# Patient Record
Sex: Female | Born: 1970
Health system: Southern US, Community
[De-identification: ages and names within clinical notes are randomized; demographics above are authoritative.]

## PROBLEM LIST (undated history)

## (undated) DIAGNOSIS — E669 Obesity, unspecified: Secondary | ICD-10-CM

## (undated) DIAGNOSIS — M2550 Pain in unspecified joint: Secondary | ICD-10-CM

## (undated) DIAGNOSIS — M1990 Unspecified osteoarthritis, unspecified site: Secondary | ICD-10-CM

## (undated) DIAGNOSIS — J069 Acute upper respiratory infection, unspecified: Secondary | ICD-10-CM

## (undated) DIAGNOSIS — T7840XA Allergy, unspecified, initial encounter: Secondary | ICD-10-CM

## (undated) DIAGNOSIS — R002 Palpitations: Secondary | ICD-10-CM

## (undated) DIAGNOSIS — E039 Hypothyroidism, unspecified: Secondary | ICD-10-CM

## (undated) DIAGNOSIS — F419 Anxiety disorder, unspecified: Secondary | ICD-10-CM

## (undated) DIAGNOSIS — F329 Major depressive disorder, single episode, unspecified: Secondary | ICD-10-CM

## (undated) DIAGNOSIS — K219 Gastro-esophageal reflux disease without esophagitis: Secondary | ICD-10-CM

## (undated) DIAGNOSIS — L920 Granuloma annulare: Secondary | ICD-10-CM

## (undated) DIAGNOSIS — Z87440 Personal history of urinary (tract) infections: Secondary | ICD-10-CM

## (undated) DIAGNOSIS — N979 Female infertility, unspecified: Secondary | ICD-10-CM

## (undated) DIAGNOSIS — G43909 Migraine, unspecified, not intractable, without status migrainosus: Secondary | ICD-10-CM

## (undated) DIAGNOSIS — Z9889 Other specified postprocedural states: Secondary | ICD-10-CM

## (undated) DIAGNOSIS — Z0000 Encounter for general adult medical examination without abnormal findings: Secondary | ICD-10-CM

## (undated) DIAGNOSIS — R109 Unspecified abdominal pain: Secondary | ICD-10-CM

## (undated) DIAGNOSIS — E785 Hyperlipidemia, unspecified: Secondary | ICD-10-CM

## (undated) DIAGNOSIS — M79674 Pain in right toe(s): Secondary | ICD-10-CM

## (undated) DIAGNOSIS — R112 Nausea with vomiting, unspecified: Secondary | ICD-10-CM

## (undated) HISTORY — DX: Major depressive disorder, single episode, unspecified: F32.9

## (undated) HISTORY — PX: BUNIONECTOMY: SHX129

## (undated) HISTORY — DX: Acute upper respiratory infection, unspecified: J06.9

## (undated) HISTORY — PX: TUBAL LIGATION: SHX77

## (undated) HISTORY — DX: Pain in unspecified joint: M25.50

## (undated) HISTORY — DX: Migraine, unspecified, not intractable, without status migrainosus: G43.909

## (undated) HISTORY — DX: Encounter for general adult medical examination without abnormal findings: Z00.00

## (undated) HISTORY — DX: Other specified postprocedural states: R11.2

## (undated) HISTORY — DX: Palpitations: R00.2

## (undated) HISTORY — DX: Granuloma annulare: L92.0

## (undated) HISTORY — DX: Personal history of urinary (tract) infections: Z87.440

## (undated) HISTORY — DX: Gastro-esophageal reflux disease without esophagitis: K21.9

## (undated) HISTORY — DX: Unspecified abdominal pain: R10.9

## (undated) HISTORY — DX: Hyperlipidemia, unspecified: E78.5

## (undated) HISTORY — DX: Female infertility, unspecified: N97.9

## (undated) HISTORY — DX: Allergy, unspecified, initial encounter: T78.40XA

## (undated) HISTORY — DX: Hypothyroidism, unspecified: E03.9

## (undated) HISTORY — DX: Obesity, unspecified: E66.9

## (undated) HISTORY — DX: Anxiety disorder, unspecified: F41.9

## (undated) HISTORY — PX: CHOLECYSTECTOMY: SHX55

## (undated) HISTORY — DX: Pain in right toe(s): M79.674

## (undated) HISTORY — DX: Other specified postprocedural states: Z98.890

## (undated) HISTORY — PX: URETHRAL STRICTURE DILATATION: SHX477

## (undated) HISTORY — DX: Unspecified osteoarthritis, unspecified site: M19.90

## (undated) HISTORY — PX: TONSILLECTOMY: SHX5217

---

## 1999-06-18 ENCOUNTER — Other Ambulatory Visit: Admission: RE | Admit: 1999-06-18 | Discharge: 1999-06-18 | Payer: Self-pay | Admitting: Obstetrics and Gynecology

## 1999-12-09 HISTORY — PX: ECTOPIC PREGNANCY SURGERY: SHX613

## 1999-12-24 ENCOUNTER — Observation Stay (HOSPITAL_COMMUNITY): Admission: AD | Admit: 1999-12-24 | Discharge: 1999-12-25 | Payer: Self-pay | Admitting: Obstetrics and Gynecology

## 1999-12-24 ENCOUNTER — Encounter: Payer: Self-pay | Admitting: Obstetrics and Gynecology

## 1999-12-24 ENCOUNTER — Encounter (INDEPENDENT_AMBULATORY_CARE_PROVIDER_SITE_OTHER): Payer: Self-pay

## 2000-02-06 ENCOUNTER — Other Ambulatory Visit: Admission: RE | Admit: 2000-02-06 | Discharge: 2000-02-06 | Payer: Self-pay | Admitting: Obstetrics and Gynecology

## 2000-03-09 ENCOUNTER — Ambulatory Visit (HOSPITAL_COMMUNITY): Admission: RE | Admit: 2000-03-09 | Discharge: 2000-03-09 | Payer: Self-pay | Admitting: Obstetrics and Gynecology

## 2000-03-09 ENCOUNTER — Encounter: Payer: Self-pay | Admitting: Obstetrics and Gynecology

## 2001-03-04 ENCOUNTER — Other Ambulatory Visit: Admission: RE | Admit: 2001-03-04 | Discharge: 2001-03-04 | Payer: Self-pay | Admitting: Obstetrics and Gynecology

## 2001-11-29 ENCOUNTER — Encounter: Admission: RE | Admit: 2001-11-29 | Discharge: 2001-11-29 | Payer: Self-pay | Admitting: Internal Medicine

## 2001-11-29 ENCOUNTER — Encounter: Payer: Self-pay | Admitting: Internal Medicine

## 2002-02-11 ENCOUNTER — Other Ambulatory Visit: Admission: RE | Admit: 2002-02-11 | Discharge: 2002-02-11 | Payer: Self-pay | Admitting: Obstetrics and Gynecology

## 2002-03-23 ENCOUNTER — Encounter: Admission: RE | Admit: 2002-03-23 | Discharge: 2002-03-23 | Payer: Self-pay | Admitting: Internal Medicine

## 2002-03-23 ENCOUNTER — Encounter: Payer: Self-pay | Admitting: Internal Medicine

## 2002-05-03 ENCOUNTER — Ambulatory Visit (HOSPITAL_COMMUNITY): Admission: RE | Admit: 2002-05-03 | Discharge: 2002-05-04 | Payer: Self-pay | Admitting: General Surgery

## 2002-05-03 ENCOUNTER — Encounter (INDEPENDENT_AMBULATORY_CARE_PROVIDER_SITE_OTHER): Payer: Self-pay | Admitting: *Deleted

## 2003-03-02 ENCOUNTER — Other Ambulatory Visit: Admission: RE | Admit: 2003-03-02 | Discharge: 2003-03-02 | Payer: Self-pay | Admitting: Obstetrics and Gynecology

## 2003-09-09 ENCOUNTER — Inpatient Hospital Stay (HOSPITAL_COMMUNITY): Admission: AD | Admit: 2003-09-09 | Discharge: 2003-09-09 | Payer: Self-pay | Admitting: Obstetrics and Gynecology

## 2003-09-11 ENCOUNTER — Inpatient Hospital Stay (HOSPITAL_COMMUNITY): Admission: AD | Admit: 2003-09-11 | Discharge: 2003-09-12 | Payer: Self-pay | Admitting: Obstetrics and Gynecology

## 2003-09-11 ENCOUNTER — Encounter: Payer: Self-pay | Admitting: Obstetrics and Gynecology

## 2003-09-11 ENCOUNTER — Inpatient Hospital Stay (HOSPITAL_COMMUNITY): Admission: AD | Admit: 2003-09-11 | Discharge: 2003-09-11 | Payer: Self-pay | Admitting: Obstetrics and Gynecology

## 2004-03-25 ENCOUNTER — Other Ambulatory Visit: Admission: RE | Admit: 2004-03-25 | Discharge: 2004-03-25 | Payer: Self-pay | Admitting: Obstetrics and Gynecology

## 2004-06-24 ENCOUNTER — Ambulatory Visit (HOSPITAL_COMMUNITY): Admission: RE | Admit: 2004-06-24 | Discharge: 2004-06-24 | Payer: Self-pay | Admitting: Gastroenterology

## 2005-04-21 ENCOUNTER — Other Ambulatory Visit: Admission: RE | Admit: 2005-04-21 | Discharge: 2005-04-21 | Payer: Self-pay | Admitting: Obstetrics and Gynecology

## 2006-02-03 ENCOUNTER — Inpatient Hospital Stay (HOSPITAL_COMMUNITY): Admission: AD | Admit: 2006-02-03 | Discharge: 2006-02-03 | Payer: Self-pay | Admitting: Obstetrics and Gynecology

## 2006-02-06 ENCOUNTER — Inpatient Hospital Stay (HOSPITAL_COMMUNITY): Admission: AD | Admit: 2006-02-06 | Discharge: 2006-02-06 | Payer: Self-pay | Admitting: Obstetrics and Gynecology

## 2006-02-09 ENCOUNTER — Inpatient Hospital Stay (HOSPITAL_COMMUNITY): Admission: AD | Admit: 2006-02-09 | Discharge: 2006-02-09 | Payer: Self-pay | Admitting: Obstetrics and Gynecology

## 2006-02-12 ENCOUNTER — Inpatient Hospital Stay (HOSPITAL_COMMUNITY): Admission: AD | Admit: 2006-02-12 | Discharge: 2006-02-12 | Payer: Self-pay | Admitting: Obstetrics and Gynecology

## 2006-10-25 ENCOUNTER — Inpatient Hospital Stay (HOSPITAL_COMMUNITY): Admission: AD | Admit: 2006-10-25 | Discharge: 2006-10-25 | Payer: Self-pay | Admitting: Obstetrics and Gynecology

## 2006-10-30 ENCOUNTER — Inpatient Hospital Stay (HOSPITAL_COMMUNITY): Admission: AD | Admit: 2006-10-30 | Discharge: 2006-10-30 | Payer: Self-pay | Admitting: Obstetrics and Gynecology

## 2007-03-18 ENCOUNTER — Inpatient Hospital Stay (HOSPITAL_COMMUNITY): Admission: AD | Admit: 2007-03-18 | Discharge: 2007-03-18 | Payer: Self-pay | Admitting: Obstetrics and Gynecology

## 2007-03-30 ENCOUNTER — Ambulatory Visit: Payer: Self-pay | Admitting: Cardiology

## 2007-03-30 LAB — CONVERTED CEMR LAB
CO2: 29 meq/L (ref 19–32)
Chloride: 105 meq/L (ref 96–112)
Creatinine, Ser: 0.6 mg/dL (ref 0.4–1.2)
Glucose, Bld: 91 mg/dL (ref 70–99)
Magnesium: 1.7 mg/dL (ref 1.5–2.5)
Potassium: 3.5 meq/L (ref 3.5–5.1)
Sodium: 138 meq/L (ref 135–145)

## 2007-04-08 ENCOUNTER — Ambulatory Visit: Payer: Self-pay

## 2007-04-08 ENCOUNTER — Encounter: Payer: Self-pay | Admitting: Internal Medicine

## 2007-04-21 ENCOUNTER — Ambulatory Visit: Payer: Self-pay | Admitting: Cardiology

## 2007-04-21 ENCOUNTER — Ambulatory Visit: Payer: Self-pay | Admitting: Gynecology

## 2007-04-28 ENCOUNTER — Ambulatory Visit: Payer: Self-pay | Admitting: Obstetrics & Gynecology

## 2007-05-05 ENCOUNTER — Ambulatory Visit: Payer: Self-pay | Admitting: Gynecology

## 2007-05-13 ENCOUNTER — Ambulatory Visit: Payer: Self-pay | Admitting: Obstetrics and Gynecology

## 2007-05-16 ENCOUNTER — Inpatient Hospital Stay (HOSPITAL_COMMUNITY): Admission: AD | Admit: 2007-05-16 | Discharge: 2007-05-20 | Payer: Self-pay | Admitting: Obstetrics & Gynecology

## 2007-05-16 ENCOUNTER — Encounter (INDEPENDENT_AMBULATORY_CARE_PROVIDER_SITE_OTHER): Payer: Self-pay | Admitting: Obstetrics & Gynecology

## 2008-08-03 ENCOUNTER — Ambulatory Visit: Payer: Self-pay | Admitting: Gastroenterology

## 2008-08-03 DIAGNOSIS — K625 Hemorrhage of anus and rectum: Secondary | ICD-10-CM | POA: Insufficient documentation

## 2008-08-11 ENCOUNTER — Telehealth: Payer: Self-pay | Admitting: Gastroenterology

## 2008-08-16 ENCOUNTER — Ambulatory Visit: Payer: Self-pay | Admitting: Gastroenterology

## 2011-01-27 ENCOUNTER — Other Ambulatory Visit: Payer: Self-pay | Admitting: Obstetrics and Gynecology

## 2011-01-27 DIAGNOSIS — R928 Other abnormal and inconclusive findings on diagnostic imaging of breast: Secondary | ICD-10-CM

## 2011-01-28 ENCOUNTER — Ambulatory Visit
Admission: RE | Admit: 2011-01-28 | Discharge: 2011-01-28 | Disposition: A | Discharge status: Home / Self Care | Payer: BC Managed Care – PPO | Source: Ambulatory Visit | Attending: Obstetrics and Gynecology | Admitting: Obstetrics and Gynecology

## 2011-01-28 DIAGNOSIS — R928 Other abnormal and inconclusive findings on diagnostic imaging of breast: Secondary | ICD-10-CM

## 2011-04-22 NOTE — Op Note (Signed)
NAMEAIMI, [name]          ACCOUNT NO.:  [id number]   MEDICAL RECORD NO.:  [id number]          PATIENT TYPE:  INP   LOCATION:                                FACILITY:  [facility]   PHYSICIAN:  Freddy Finner, M.D.   DATE OF BIRTH:  06-12-1971   DATE OF PROCEDURE:  05/16/2007  DATE OF DISCHARGE:                               OPERATIVE REPORT   PREOPERATIVE DIAGNOSES:  1. Intrauterine pregnancy with twins.  2. Premature rupture of membranes at 34 1/2 weeks.  3. Onset of premature labor.  4. Twins by ultrasound were vertex, transverse with back down on the      ultrasound.   SECONDARY DIAGNOSIS:  Request for voluntary sterilization.   POSTOPERATIVE DIAGNOSES:  1. Intrauterine pregnancy  with twins.  2. Premature rupture of membranes at 34 1/2 weeks.  3. Onset of premature labor.  4. Twins by ultrasound were vertex, transverse with back down on the      ultrasound.  5. Delivery of viable twins. Twin A was female, Apgar's of 8 and 9. Cord      pH of 7.36. Twin B was a girl, Apgar's of 8 and 9. Cord arterial pH      of 7.35.   OPERATIVE PROCEDURE:  Primary low transverse cervical Cesarean section  with delivery of twin infants.   SECONDARY PROCEDURE:  Bilateral tubal ligation. Small segment of the  __________ portion of the tube from the right was excised. No fimbria  was present. The pedicle was suture ligated with O Monocryl. The left  fallopian tube was suture ligated with O Monocryl and a segment of tube  excised between two interrupted ties. The distal mucosa, distal to the  __________ , was fulgurated with a __________ salpinx. Uterine fibroids  were noted. They were subserosal and intramural.   INTRAOPERATIVE COMPLICATIONS:  None.   ESTIMATED INTRAOPERATIVE BLOOD LOSS:  800 cc.   ANESTHESIA:  Spinal.   DESCRIPTION OF PROCEDURE:  The patient was brought to the operating room  and placed under adequate spinal anesthesia, placed in the dorsal  recumbent position with  elevation of the right hip by 15 degrees.  Abdomen was prepped and draped with Betadine scrub, followed by Betadine  solution. Foley catheter was placed using sterile technique. A lower  abdominal transverse incision was made and carried sharply down the  fascia. Bleeding subcutaneous vessels were controlled with a bovie.  Fascia was entered sharply and extended to the skin incision in a  transverse direction. Rectus sheath was developed superiorly and  inferiorly with blunt and sharp dissection. Rectus muscle was divided at  the midline. Peritoneum was elevated and entered sharply and extended  broadly to the extent of skin incision. Bladder blade was placed.  Transverse incision was made in the vesicoperitoneumoverlying the lower  uterine segment. Transverse incision was made in the lower segment and  extended bluntly in a transverse direction. Amniotic fluid was still  present. It was ruptured and was clear. A viable female was first  delivered without difficulty. Arterial cord blood as well as routine  cord bloods were obtained from both cords.  The second twin had feet  palpable through the membrane. They were grasped and membrane was  ruptured. The breech extraction was accomplished with no trauma or  difficulty. Both placenta's were then removed. The uterus was delivered  onto the abdominal wall. Careful manual expiration of the cavity  revealed complete evacuation of the placenta's. The uterine incision was  closed in abdominal layer with running, locking O Monocryl for the first  layer and imitating suture of O Monocryl for the second. Bladder flap  was reapproximated with an interrupted O Monocryl suture. Segments of  the tubes were excised as noted above. The uterus was pushed back into  the abdominal cavity, irrigation was carried out. Hemostasis was  confirmed at both tubal ligation sites and the lower transverse  incision. Pack, needle, and instruments counts were correct. The   abdominal incision was closed in layers. Running O Monocryl was used to  close the peritoneum and re-approximate the rectus muscles. Fascia was  closed with double looped O PDS, running from angle to angle on either  side. Subcutaneous tissue was approximated with running 2-0 plain. Skin  was closed with long skin staples and 1/4 inch Steri-strips.   DISPOSITION:  The patient tolerated the operative procedure well and was  taken to the recovery room in good condition.      Freddy Finner, M.D.  Electronically Signed     WRN/MEDQ  D:  05/16/2007  T:  05/16/2007  Job:  [id number]

## 2011-04-22 NOTE — Assessment & Plan Note (Signed)
[facility]                            CARDIOLOGY OFFICE NOTE   [name]                 MRN:          [id number]  DATE:04/21/2007                            DOB:          03-19-1971    [name] returns today for further management of her palpitations.   She has had no events since we called her and asked her to supplement  her potassium. Her potassium was 3.5. She is doing this through dietary  measures.   Her 2-D echocardiogram was completely normal.   Her blood work including a magnesium and thyroid function was normal.   Her pressure today is 112/72. Her pulse is 96 and regular. Her weight is  196.  Respiratory exam is unchanged.  She has some trivial peripheral edema.   ASSESSMENT AND PLAN:  [name] is doing well. We will ask to return  her CardioNet monitor in and continue with dietary potassium  supplementation. We will see her back on a p.r.n. basis.     Thomas C. Daleen Squibb, MD, [facility]  Electronically Signed    TCW/MedQ  DD: 04/21/2007  DT: 04/21/2007  Job #: [id number]   cc:   Guy Sandifer. Henderson Cloud, M.D.

## 2011-04-25 NOTE — H&P (Signed)
[facility]  Patient:    [name]                    MRN: [id number] Adm. Date:  [id number] Attending:  Trevor Iha                         History and Physical  HISTORY OF PRESENT ILLNESS:   [name] is a 40 year old, gravida 1, para 0, who presented with nausea, vomiting, abdominal pain, and cramping.  She underwent an ultrasound which showed a 7 week 1 day right ectopic pregnancy with fetal heart tones.  Also blood in the cul-de-sac.  The patients hemoglobin was 13, although, was having right shoulder pain and some rebound symptoms in the abdomen consistent with ruptured ectopic.  Blood type is O positive.  The patient denies any previous history of any kind of GYN problems, history of endometriosis, or tubal surgery.  PAST MEDICAL HISTORY:         Negative.  PAST SURGICAL HISTORY:        Significant for bunion surgery and knee surgery.  PAST GYN HISTORY:             Significant for dysplasia, but no history of endometriosis or tubal surgery.  MEDICATIONS:                  Prenatal vitamins.  ALLERGIES:                    No known drug allergies.  PHYSICAL EXAMINATION:  VITAL SIGNS:                  Blood pressure 112/60, pulse 98, respirations 20.  She does become orthostatic upon standing.  HEART:                        Regular rate and rhythm.  LUNGS:                        Clear to auscultation bilaterally.  ABDOMEN:                      Nondistended.  Mild tenderness to deep palpation,  generalized and some mild rebound symptoms.  PELVIC:                       Reveals an anteverted mobile uterus, approximately 6 weeks size with tenderness in the right adnexa and fullness.  Ultrasound shows  probable right ectopic pregnancy measuring 7 weeks 1 day with fetal heart tones  present.  IMPRESSION:                   Probable right ectopic pregnancy.  Probable hemoperitoneum.  PLAN:                          Laparoscopic evaluation and treatment of right ectopic pregnancy.  The risks and benefits were discussed at length with the patient including, but not limited to risk of infection, bleeding, damage to uterus, tubes, ovaries, bowel, or bladder, need for laparotomy, also need for blood products including risks of blood products including risks of infection such as hepatitis or AIDS and reaction to  blood transfusion.  The patient does desire future fertility, so we weill do everything we can to preserve her fertility, however,  she does understand that he is at high risk for a right salpingectomy with possible oophorectomy.  She does  give her informed consent. DD:  12/24/99 TD:  12/24/99 Job: [id number]

## 2011-04-25 NOTE — Op Note (Signed)
[facility]  Patient:    [name]                    MRN: [id number] Proc. Date: 12/24/99 Adm. Date:  [id number] Attending:  Trevor Iha                           Operative Report  PREOPERATIVE DIAGNOSIS:       Probable right ectopic pregnancy and probable hemoperitoneum.  POSTOPERATIVE DIAGNOSIS:      Right ectopic pregnancy, hemoperitoneum, and pelvic adhesive disease.  OPERATION:                    Operative laparoscopy with right salpingectomy and lysis of adhesions.  SURGEON:                      Trevor Iha, M.D.  ASSISTANT:  ANESTHESIA:                   General endotracheal anesthesia.  ESTIMATED BLOOD LOSS:         600 cc.  INDICATIONS:                  [name] is a 41 year old, gravida 1, para 0, who presented with abdominal pain, positive pregnancy test, and orthostatic blood pressures despite a hemoglobin of 13, blood type O positive.  Ultrasound did show a right ectopic pregnancy at 7 weeks 1 day with fetal heart tones.  As the right ectopic pregnancy and probable hemoperitoneum, I recommended laparoscopic evaluation and treatment.  The risks and benefits were discussed at length including, but not limited to, risk of infection, bleeding, damage to uterus, ovaries, bowel, or bladder, need for laparotomy, right salpingectomy, or right salpingo-oophorectomy.  The patient also understands the risks of blood transfusion and gives her informed consent.  FINDINGS:                     There is a right ampullary ectopic pregnancy. Extensive adhesions from the right tube and ovary.  Also adhesions from the appendix to the pelvic side wall and left ovary to the left fallopian tube. Hemoperitoneum.  Normal appearing liver and uterus.  DESCRIPTION OF PROCEDURE:     After adequate general anesthesia, the patient was placed in the dorsal lithotomy position.  She was sterilely prepped and draped.  The  bladder was sterilely drained.  A Cohen tenaculum was placed on the cervix. A 1 cm infraumbilical skin incision was made.  The Veress needle was inserted. The abdomen was insufflated to dullness to percussion.  The above findings were noted after a trocar was inserted.  Two 5 mm ports were placed to the left and right f the midline two fingerbreadths above the pubic symphysis.  The majority of the hemoperitoneum was evacuated with the Nezhat suction irrigation.  The right fallopian tube and ovary were densely adhesed.  Lysis of adhesions was performed. The distal ampullary portion of the tube had active bleeding.  This was cauterized with Bovie cautery.  Ectopic pregnancy appeared to be in the ampullary portion,  part of the tubal abortion was occurring.  Graspers were used to evacuate some f the tissue from the distal end of the tube.  However, being unsuccessful at this, a linear salpingotomy was performed on the antimesenteric border of the ampullary  portion of the tube.  The majority of the ectopic  pregnancy was removed, however, because of the tubal obstruction from both adhesive disease and from the ectopic pregnancy, unable to achieve adequate hemostasis and preserve the tube.  The tube was freed from the right ovary and the pelvic side wall after fairly extensive dissection.  The tube and scar tissue encased the right ovary.  After lysis of adhesions to free up the right ovary from the tube, the midportion was grasped,  bipolar cautery was used to cross the midportion of the tube down to the mesosalpinx assuring hemostasis and sharp endoshears were used to excise the distal portion of the tube.  Good hemostasis was achieved.  The ovary again was freed rom the scar tissue both in the cul-de-sac and also from the fallopian tube. Hemostasis was achieved using bipolar cautery.  After a copious amount of irrigation, adequate hemostasis was assured on the right side.   Reexamination of the abdomen showed the appendix adhered to the pelvic side wall.  This was sharply dissected and hemostasis was assured taking care to avoid the actual appendix. No evidence of endometriosis was noted at this time.  The left fallopian tube was adhered to the pelvic side wall and also to the ovary.  Sharp dissection was performed around the fimbriated end of the tube, freeing the fimbriated end from the pelvic side wall and the ovary.  Hemostasis was achieved with bipolar cautery. After the lysis of adhesions, the left fallopian tube appeared to be free and the fimbriated end opened and no more adhesions from the ovary or tube. Reexamination of the right tube and ovary showed approximately 3 cm of right fallopian tube left, but good hemostasis along the mesosalpinx and the fallopian tube after the slapingectomy.  Good hemostasis along the ovary and no more adhesions noted. The appendix also appeared to be in good shape.  Copious amount of irrigation after  adequate evacuation of the hemoperitoneum of approximately 600 cc of blood. The laparoscope was removed.  The umbilical incision was closed with a 0 Vicryl on he fascia and 3-0 Vicryl Rapide in the subcuticular fashion.  The 5 mm port incisions were closed with 3-0 Vicryl Rapide in the subcuticular fashion with good approximation and good hemostasis.  They were injected with 0.25% Marcaine. The tenaculum was removed from the cervix.  The cervix was noted to be hemostatic. The patient tolerated the procedure well and was stable on transfer to the recovery  room.  Estimated blood loss during the procedure again was 600 cc.  The patient  will remain overnight in observation status for evaluation and will be discharged to home in the morning. DD:  12/24/99 TD:  12/24/99 Job: [id number]

## 2011-04-25 NOTE — Discharge Summary (Signed)
NAME:  [name], [name]                    ACCOUNT NO.:  [id number]   MEDICAL RECORD NO.:  [id number]                   PATIENT TYPE:  INP   LOCATION:  9303                                 FACILITY:  [facility]   PHYSICIAN:  Guy Sandifer. Arleta Creek, M.D.           DATE OF BIRTH:  Apr 13, 1971   DATE OF ADMISSION:  09/12/2003  DATE OF DISCHARGE:  09/12/2003                                 DISCHARGE SUMMARY   ADMISSION DIAGNOSES:  1. Falling quantitative hCG.  2. Possible ectopic pregnancy.  3. Possible spontaneous abortion.   DISCHARGE DIAGNOSES:  1. Abnormal pregnancy.  2. Possible ectopic pregnancy.  3. Possible spontaneous abortion.   REASON FOR ADMISSION:  The patient is a 40 year old married white female,  G2, P0, status post right partial salpingectomy for ectopic pregnancy in the  past.  She has been followed for serial quantitative hCGs as follows:  On  September 09, 2003, 400, on September 11, 2003, 1362, later in the day on September 11, 2003, 986, and on September 12, 2003, 1509.  She presents to the hospital  with pelvic pain.  She is admitted for observation.   HOSPITAL COURSE:  The patient is admitted to the hospital for careful  observation.  Upon admission hemoglobin is 13.5, and white count is 6.4.  Ultrasound is consistent with some fluid in the cul-de-sac, but no evidence  of IUP or distinct adnexal mass.  Vital signs are stable and she remains  afebrile.  Later in the day, she complains of some dull aching in the left  lower quadrant, although it is not severe.  There is no dizziness, no  syncope, no nausea or vomiting, and no vaginal bleeding.  Abdomen is flat  and soft with positive bowel sounds and mild left lower quadrant tenderness  without rebound or masses.  The day prior an ultrasound in the office there  was a 1.3 cm cystic mass in the left adnexa.  Options and management were  carefully discussed with the patient.  The fact that there is an abnormal  change in the  quantitative hCG is discussed.  I also discussed this with Dr.  Luisa Hart of pathology regarding her lab results as well.  After reviewing the  options with the patient and her husband she is given methotrexate per  protocol.  She is then discharged home later in the day.   CONDITION ON DISCHARGE:  Good.   DIET:  Regular as tolerated.   ACTIVITY:  No vaginal entry, no heavy lifting.  She is to call the office  for problems, including, but not limited to heavy vaginal bleeding,  increasing pain, persistent nausea/vomiting, dizziness, or syncope.   FOLLOWUP:  Per the methotrexate protocol, as well as further followup in the  office.  Guy Sandifer Arleta Creek, M.D.   JET/MEDQ  D:  10/05/2003  T:  10/05/2003  Job:  [id number]

## 2011-04-25 NOTE — Discharge Summary (Signed)
[name], [name]          ACCOUNT NO.:  [id number]   MEDICAL RECORD NO.:  [id number]          PATIENT TYPE:  INP   LOCATION:                                FACILITY:  [facility]   PHYSICIAN:  Freddy Finner, M.D.   DATE OF BIRTH:  [birth year]   DATE OF ADMISSION:  05/16/2007  DATE OF DISCHARGE:  05/20/2007                               DISCHARGE SUMMARY   ADMITTING DIAGNOSES:  1. Intrauterine pregnancy 34-1/2 weeks' estimated gestational age.  2. Pre-term premature rupture of membranes.  3. Twin gestation.   DISCHARGE DIAGNOSES:  1. Status post low transverse cesarean section.  2. Viable female and female infant.  3. Permanent sterilization.   PROCEDURE:  1. Primary low transverse cesarean section.  2. Bilateral tubal ligation.   REASON FOR ADMISSION:  Please see written H&P.   HOSPITAL COURSE:  The patient is a 40 year old white married female  gravida 4, para 0, that presents to [facility] at 34-1/2  weeks with spontaneous rupture of membranes.  Fluid was noted to be  clear.  Cervix was examined and noted to be 2 to 3 cm dilated with  cervix 90% effaced.  Ultrasound was performed which revealed twin A in  vertex presentation, twin B in transverse presentation, with a decision  was made to proceed with a cesarean delivery.  The patient had also  requested permanent sterilization.  The patient was then transferred to  the operating room where spinal anesthesia was administered without  difficulty.  A low transverse incision was made with delivery of twin A  female infant with Apgars of 8 at 1 minute, 9 at 5 minute.  Arterial cord  pH of 7.36, twin B female infant with Apgars of 8 at 1 minute, 9 at 5  minutes.  Arterial cord pH was 7.35.  The tubal ligation was performed,  and the patient was later transferred to the recovery room in stable  condition.  On postoperative day #1, the patient was without complaint.  Vital signs were stable.  Abdomen soft, fundus firm  and nontender.   LABORATORY FINDINGS:  Revealed hemoglobin of 9.1, platelet count of  230,000, WBC count of 14.6.   On postoperative day #2, the patient did complain of some itching, and  then a micropapulous rash, was noted on her chest.  Vital signs were  otherwise stable.  She is afebrile.  Abdomen soft.  Fundus firm and  nontender.  Incision was clean, dry and intact.  Hydrocortisone was  ordered for the rash, and pain medication was changed to Dilaudid.  On  postoperative day #3, the patient was without complaint.  Vital signs  were stable.  Fundus firm and nontender.  Incision was clean, dry and  intact.  On postoperative day,  #4 the patient was without complaint.  Vital signs were stable.  She is  afebrile.  Fundus firm and nontender.  Incision was clean, dry and  intact.  Staples were removed and the patient was later discharged home.   CONDITION ON DISCHARGE:  Stable.   DIET:  Regular as tolerated.   ACTIVITY:  No heavy  lifting, no driving x2 weeks, no vaginal entry.   FOLLOWUP:  The patient to follow up in the office in 1 to 2 weeks for an  incision check.  She is to call for a temperature of greater than 100  degrees, persistent nausea with vomiting, any vaginal bleeding and/or  redness or drainage from incisional site.   DISCHARGE MEDICATIONS:  1. Dilaudid 2 mg, one p.o. every 4 to 6 hours.  2. Motrin 600 mg every 6 hours.  3. Prenatal vitamins one p.o. daily.      [name], N.P.      Freddy Finner, M.D.  Electronically Signed    CC/MEDQ  D:  06/18/2007  T:  06/19/2007  Job:  [id number]

## 2011-04-25 NOTE — Assessment & Plan Note (Signed)
[facility]                            CARDIOLOGY OFFICE NOTE   [name], [name]                 MRN:          [id number]  DATE:03/30/2007                            DOB:          02/08/1971    I was asked by Dr. Harold Hedge to evaluate [name], a  delightful 40 year old married white female for rapid heartbeat and  dizziness.   She is pregnant with twins at 81 weeks.  This is her first pregnancy.   Starting in March she had sporadic episodes of a rapid heartbeat that  would only last a few minutes.  This has become very frequent lately,  happening as many times as two times a day over the last week and a  half.  It comes on suddenly, unpredictably, and stops just as quickly.  She has associated dizziness and then she gets a headache.   She does have a history of migraines in the past.   PAST MEDICAL HISTORY:  She was told she had a benign flow murmur by Dr.  Demetrius Revel years ago.  She has had a history of tachy palpitations in the  past but nothing like this.   She has had no history of thyroid disease or any sort of endocrine  problem.   She is only on prenatal vitamins one daily and Pepcid AC for reflux.   ALLERGIES:  She is allergic to DYE for kidney studies in the past.   She does not smoke, drink or use illicit drugs.  She does have 2-3 cans  of caffeinated beverages a day.   PAST SURGICAL HISTORY:  1. Tonsillectomy in 1977.  2. Bunionectomy in 1993.  3. Knee surgery in 1997.  4. Cyst removal of the hand in 1999.   FAMILY HISTORY:  Negative for premature coronary disease.   SOCIAL HISTORY:  She is married and as mentioned above, she is pregnant.   REVIEW OF SYSTEMS:  Other than the HPI, is positive only for some  allergies and hay fever and gastroesophageal reflux.   Her review of systems were completely reviewed.   PHYSICAL EXAMINATION:  GENERAL APPEARANCE:  She is very pleasant.  She  is in no acute  distress.  VITAL SIGNS:  Her blood pressure is 100/67 with pulse of 88 lying down,  sitting is 98/66 with pulse of 98 and standing is 102/66 with pulse of  108.  After standing for five minutes, her blood pressure was 97/68 and  a heart rate of 101.  She felt okay during the entire orthostatic  checks.  She is 5 feet 8-1/2 and weighs 183 pounds.  HEENT:  Normocephalic and atraumatic.  PERRLA.  Extraocular movements  intact.  Sclerae are clear.  Facial symmetry is normal.  NECK:  Carotid upstrokes are equal bilaterally without bruits.  There is  no JVD.  Thyroid is not enlarged.  Trachea is midline.  LUNGS:  Clear.  CARDIOVASCULAR:  Nondisplaced PMI.  She has normal S1 and S2 without rub  or gallop.  She does have a soft systolic murmur along the left sternal  border.  ABDOMEN:  Soft but  distended with pregnancy.  There was good bowel  sounds.  EXTREMITIES:  No edema.  Pulses are brisk.  NEUROLOGIC:  Intact.  SKIN:  Normal.   EKG shows sinus tachycardia, rate of 106 with normal PR, QRS and QTC is  upper limits of normal.   ASSESSMENT/PLAN:  1. Tachycardia which sounds sudden onset and stops suddenly as well.      Rule out supraventricular tachycardia.  2. Systolic murmur, probably benign flow murmur.   PLAN:  1. A 2-D echocardiogram  without structural heart disease.  2. BMET and magnesium.  3. TSH and free T4.  4. CardioNet monitor.   Follow up with me in 2-3 weeks to review and hopefully diagnose his  problem.   I have told her to stay well hydrated, if she feels presyncopal or  syncopal, to lie down and lay on her left side, and to come to the  emergency room if the tachycardia is persistent.     Thomas C. Daleen Squibb, MD, [facility]  Electronically Signed    TCW/MedQ  DD: 03/30/2007  DT: 03/30/2007  Job #: [id number]   cc:   Georgann Housekeeper, MD

## 2011-04-25 NOTE — Op Note (Signed)
[place]  Patient:    [name], [name] Visit Number: [id number] MRN: [id number]          Service Type: DSU Location: [id number] Attending Physician:  Delsa Bern Dictated by:   Lorne Skeens. Hoxworth, M.D. Proc. Date: 05/03/02 Admit Date:  05/03/2002 Discharge Date: 05/04/2002                             Operative Report  PREOPERATIVE DIAGNOSIS:  Cholelithiasis and cholecystitis.  POSTOPERATIVE DIAGNOSIS:  Cholelithiasis and cholecystitis.  OPERATION PERFORMED:  Laparoscopic cholecystectomy.  SURGEON:  Lorne Skeens. Hoxworth, M.D.  ASSISTANT:  Donnie Coffin. Samuella Cota, M.D.  ANESTHESIA:  General.  INDICATIONS FOR PROCEDURE:  The patient is a 40 year old white female who presents with typical episodes of biliary colic and gallbladder ultrasound showing stones.  Common bile duct is normal and LFTs are normal.  Laparoscopic cholecystectomy has been recommended and accepted.  The nature of the procedure, its indications and risks of bleeding, infection, bile leak and bile duct injury were discussed and understood.  The patient is now brought to the operating room for this procedure.  DESCRIPTION OF PROCEDURE:  The patient was brought to the operating room and placed in supine position on the operating table and general endotracheal anesthesia was induced.  The abdomen was sterilely prepped and draped.  She received preoperative antibiotics.  PAS were in place.  Local anesthesia was used to infiltrate the trocar sites.  A 1 cm incision was made at the umbilicus and dissection carried down to the midline fascia which was sharply incised for 1 cm and the peritoneum entered under direct vision.  Through a mattress suture of 0 Vicryl, the Hasson trocar was inserted and pneumoperitoneum established.  Under direct vision a 10 mm trocar was placed in the subxiphoid area and two 5 mm trocars along the right subcostal margin near the  gallbladder was visualized and had significant omental adhesions to the fundus that were taken down sharply and infundibulum exposed and retracted inferolaterally.  The peritoneum anterior and posterior along Calots triangle was divided with cautery and fibrofatty tissue was stripped off the neck of the gallbladder toward the porta hepatis.  The distal gallbladder in Calots triangle was thoroughly dissected and cystic duct identified, cystic artery identified and cystic duct, gallbladder junction dissected 360 degrees.  When the anatomy was clear, the cystic duct was doubly clipped proximally and divided as was the cystic.  The gallbladder was then dissected free from its bed using hook cautery and removed intact through the umbilicus.  Operative site was inspected for hemostasis and all trocars removed under direct vision and CO2 evacuated from the peritoneal cavity.  The mattress suture was secured to the umbilicus.  The skin incisions were closed with interrupted subcuticular 4-0 Monocryl and Steri-Strips.  Sponge, needle and instrument counts were correct.  Dry sterile dressing was applied.  The patient was taken to the recovery room in good condition. Dictated by:   Lorne Skeens. Hoxworth, M.D. Attending Physician:  Delsa Bern DD:  05/03/02 TD:  05/04/02 Job: [id number]

## 2011-06-26 ENCOUNTER — Other Ambulatory Visit: Payer: Self-pay | Admitting: Dermatology

## 2011-08-26 ENCOUNTER — Ambulatory Visit (INDEPENDENT_AMBULATORY_CARE_PROVIDER_SITE_OTHER): Payer: BC Managed Care – PPO | Admitting: Family Medicine

## 2011-08-26 ENCOUNTER — Encounter: Payer: Self-pay | Admitting: Family Medicine

## 2011-08-26 VITALS — BP 105/58 | HR 66 | Temp 98.1°F | Ht 68.5 in | Wt 164.4 lb

## 2011-08-26 DIAGNOSIS — L920 Granuloma annulare: Secondary | ICD-10-CM | POA: Insufficient documentation

## 2011-08-26 DIAGNOSIS — F411 Generalized anxiety disorder: Secondary | ICD-10-CM

## 2011-08-26 DIAGNOSIS — Z0000 Encounter for general adult medical examination without abnormal findings: Secondary | ICD-10-CM | POA: Insufficient documentation

## 2011-08-26 DIAGNOSIS — R002 Palpitations: Secondary | ICD-10-CM | POA: Insufficient documentation

## 2011-08-26 DIAGNOSIS — Z87440 Personal history of urinary (tract) infections: Secondary | ICD-10-CM | POA: Insufficient documentation

## 2011-08-26 DIAGNOSIS — F419 Anxiety disorder, unspecified: Secondary | ICD-10-CM | POA: Insufficient documentation

## 2011-08-26 DIAGNOSIS — Z23 Encounter for immunization: Secondary | ICD-10-CM

## 2011-08-26 HISTORY — DX: Encounter for general adult medical examination without abnormal findings: Z00.00

## 2011-08-26 MED ORDER — ALPRAZOLAM 0.25 MG PO TABS: ORAL_TABLET | ORAL | Status: DC

## 2011-08-26 NOTE — Assessment & Plan Note (Signed)
Patient has tried Zoloft in the past but it caused night sweats so she stopped it. For now she is given some Alprazolam to use prn to see if her symptoms improve if they do not may need to consider daily medication. Also encouraged to start fish oil and increase sleep. She is having a sensation of dyspnea when she lies down a couple times a week and she has palpitations during the day at times.

## 2011-08-26 NOTE — Progress Notes (Signed)
[name] [id number] 1971-08-14 08/26/2011      Progress Note New Patient  Subjective  Chief Complaint  Chief Complaint  Patient presents with  . Establish Care    new patient    HPI  Patient is a 40 year old female in today for new patient appointment. She is the mother of 48-year-old twins and has been struggling with some anxiety intermittently since they were born. Has tried Zoloft in the past but that gave her night sweats and made her feel bad so she stopped it. At present she is having increased palpitations both day and night. She has episodes of feeling as if she's suffocating at bedtime roughly twice a week. When she sits up if symptoms abate after her she coughs or so for several minutes. She denies that the dyspnea ever awakens her. The dyspnea and the palpitations do not occur together. No nausea, diaphoresis is noted with these episodes. She receives her GYN care from [facility]. No history of abnormal Paps. Has had some cysts noted in her right breast. 6 she's recently had a skin biopsy with Dr. 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 Improved at this time. No recent illness, fevers, chills, chest pain, shortness of breath except for the dyspnea at bedtime as noted, GI or GU complaints  Past Medical History  Diagnosis Date  . Granuloma annulare     bx confirmed, improved  . Anxiety   . Hx: UTI (urinary tract infection)     in childhood, resolved with urethral stretching  . Palpitations   . Preventative health care 08/26/2011    Past Surgical History  Procedure Date  . Cesarean section   . Cholecystectomy   . Tubal ligation   . Bunionectomy     left  . Tonsillectomy   . Urethral stricture dilatation     childhood    Family History  Problem Relation Age of Onset  . Hypertension Mother   . Depression Father     suicide  . Hypertension Father   . COPD Father   . Hypertension Brother   . Alzheimer's  disease Maternal Grandmother   . Alzheimer's disease Maternal Grandfather   . Heart disease Paternal Grandfather     MI    History   Social History  . Marital Status: Married    Spouse Name: N/A    Number of Children: N/A  . Years of Education: N/A   Occupational History  . Not on file.   Social History Main Topics  . Smoking status: Never Smoker   . Smokeless tobacco: Never Used  . Alcohol Use: No  . Drug Use: No  . Sexually Active: Yes   Other Topics Concern  . Not on file   Social History Narrative  . No narrative on file    No current outpatient prescriptions on file prior to visit.    Not on File  Review of Systems  Review of Systems  Constitutional: Negative for fever, chills and malaise/fatigue.  HENT: Negative for hearing loss, nosebleeds and congestion.   Eyes: Negative for discharge.  Respiratory: Negative for cough, sputum production, shortness of breath and wheezing.   Cardiovascular: Negative for chest pain, palpitations and leg swelling.  Gastrointestinal: Negative for heartburn, nausea, vomiting, abdominal pain, diarrhea, constipation and blood in stool.  Genitourinary: Negative for dysuria, urgency, frequency and hematuria.  Musculoskeletal: Negative for myalgias, back pain and falls.  Skin: Negative for rash.  Neurological: Negative for dizziness, tremors,  sensory change, focal weakness, loss of consciousness, weakness and headaches.  Endo/Heme/Allergies: Negative for polydipsia. Does not bruise/bleed easily.  Psychiatric/Behavioral: Negative for depression and suicidal ideas. The patient is nervous/anxious and has insomnia.     Objective  BP 105/58  Pulse 66  Temp(Src) 98.1 F (36.7 C) (Oral)  Ht 5' 8.5" (1.74 m)  Wt 164 lb 6.4 oz (74.571 kg)  BMI 24.63 kg/m2  SpO2 98%  Physical Exam  Physical Exam  Constitutional: She is oriented to person, place, and time and well-developed, well-nourished, and in no distress. No distress.  HENT:   Head: Normocephalic and atraumatic.  Right Ear: External ear normal.  Left Ear: External ear normal.  Nose: Nose normal.  Mouth/Throat: Oropharynx is clear and moist. No oropharyngeal exudate.  Eyes: Conjunctivae are normal. Pupils are equal, round, and reactive to light. Right eye exhibits no discharge. Left eye exhibits no discharge. No scleral icterus.  Neck: Normal range of motion. Neck supple. No thyromegaly present.  Cardiovascular: Normal rate, regular rhythm, normal heart sounds and intact distal pulses.   No murmur heard. Pulmonary/Chest: Effort normal and breath sounds normal. No respiratory distress. She has no wheezes. She has no rales.  Abdominal: Soft. Bowel sounds are normal. She exhibits no distension and no mass. There is no tenderness.  Musculoskeletal: Normal range of motion. She exhibits no edema and no tenderness.  Lymphadenopathy:    She has no cervical adenopathy.  Neurological: She is alert and oriented to person, place, and time. She has normal reflexes. No cranial nerve deficit. Coordination normal.  Skin: Skin is warm and dry. No rash noted. She is not diaphoretic.  Psychiatric: Mood, memory and affect normal.       Assessment & Plan  Hx: UTI (urinary tract infection) No difficulty since childhood.  Palpitations Check thyroid and try Alprazolam prn if no response would benefit from Holter monitor if symptoms persist.  Anxiety Patient has tried Zoloft in the past but it caused night sweats so she stopped it. For now she is given some Alprazolam to use prn to see if her symptoms improve if they do not may need to consider daily medication. Also encouraged to start fish oil and increase sleep. She is having a sensation of dyspnea when she lies down a couple times a week and she has palpitations during the day at times.   Preventative health care Given Tdap and flu shot today. Encouraged 8 hours of sleep, regular exercise, small, frequent meals with lean  proteins and complex carbs

## 2011-08-26 NOTE — Assessment & Plan Note (Signed)
Given Tdap and flu shot today. Encouraged 8 hours of sleep, regular exercise, small, frequent meals with lean proteins and complex carbs

## 2011-08-26 NOTE — Assessment & Plan Note (Signed)
Check thyroid and try Alprazolam prn if no response would benefit from Holter monitor if symptoms persist.

## 2011-08-26 NOTE — Assessment & Plan Note (Signed)
No difficulty since childhood.

## 2011-08-27 LAB — CBC WITH DIFFERENTIAL/PLATELET
Basophils Relative: 0.5 % (ref 0.0–3.0)
Eosinophils Absolute: 0.1 10*3/uL (ref 0.0–0.7)
Eosinophils Relative: 2.2 % (ref 0.0–5.0)
HCT: 40.2 % (ref 36.0–46.0)
Hemoglobin: 13.4 g/dL (ref 12.0–15.0)
MCHC: 33.4 g/dL (ref 30.0–36.0)
MCV: 89.5 fl (ref 78.0–100.0)
Monocytes Absolute: 0.5 10*3/uL (ref 0.1–1.0)
Neutro Abs: 2.7 10*3/uL (ref 1.4–7.7)
Neutrophils Relative %: 56 % (ref 43.0–77.0)
RBC: 4.49 Mil/uL (ref 3.87–5.11)
WBC: 4.9 10*3/uL (ref 4.5–10.5)

## 2011-08-27 LAB — RENAL FUNCTION PANEL
Albumin: 4.5 g/dL (ref 3.5–5.2)
BUN: 14 mg/dL (ref 6–23)
CO2: 28 mEq/L (ref 19–32)
Calcium: 8.7 mg/dL (ref 8.4–10.5)
Chloride: 104 mEq/L (ref 96–112)
Creatinine, Ser: 1 mg/dL (ref 0.4–1.2)
GFR: 68.69 mL/min (ref >60.00)
Glucose, Bld: 82 mg/dL (ref 70–99)
Phosphorus: 3 mg/dL (ref 2.3–4.6)
Potassium: 4.5 mEq/L (ref 3.5–5.1)
Sodium: 138 mEq/L (ref 135–145)

## 2011-08-27 LAB — HEPATIC FUNCTION PANEL
ALT: 11 U/L (ref 0–35)
Albumin: 4.5 g/dL (ref 3.5–5.2)
Bilirubin, Direct: 0.1 mg/dL (ref 0.0–0.3)
Total Protein: 7.5 g/dL (ref 6.0–8.3)

## 2011-08-27 LAB — LIPID PANEL
Total CHOL/HDL Ratio: 3
Triglycerides: 53 mg/dL (ref 0.0–149.0)

## 2011-08-27 LAB — T4, FREE: Free T4: 0.98 ng/dL (ref 0.60–1.60)

## 2011-08-27 LAB — TSH: TSH: 1.9 u[IU]/mL (ref 0.35–5.50)

## 2011-08-27 LAB — MAGNESIUM: Magnesium: 2.1 mg/dL (ref 1.5–2.5)

## 2011-08-27 NOTE — Progress Notes (Signed)
Addended by: Baldemar Lenis R on: 08/27/2011 12:49 PM   Modules accepted: Orders

## 2011-08-28 ENCOUNTER — Telehealth: Payer: Self-pay

## 2011-08-28 NOTE — Telephone Encounter (Signed)
Patient informed. 

## 2011-08-28 NOTE — Telephone Encounter (Signed)
Informed patient of lab results and patient stated md mentioned something about picking up a heart monitor? Patient was wandering what she should do? Please advise?

## 2011-08-28 NOTE — Telephone Encounter (Signed)
Remind her I told her to try the medication prn this week for anxiety and then see if it gets better, if no better then we can order a monitor next week or two

## 2011-08-28 NOTE — Telephone Encounter (Signed)
Left a message for patient to return my call. 

## 2011-09-12 ENCOUNTER — Ambulatory Visit (INDEPENDENT_AMBULATORY_CARE_PROVIDER_SITE_OTHER): Payer: BC Managed Care – PPO | Admitting: Cardiovascular Disease

## 2011-09-12 ENCOUNTER — Encounter: Payer: Self-pay | Admitting: Cardiovascular Disease

## 2011-09-12 VITALS — BP 101/67 | HR 70 | Resp 18 | Ht 68.0 in | Wt 163.8 lb

## 2011-09-12 DIAGNOSIS — R002 Palpitations: Secondary | ICD-10-CM

## 2011-09-12 NOTE — Assessment & Plan Note (Addendum)
Most likely premature beats. Will have her wear a 48 hour Holter monitor. Will arrange echocardiogram to assess LV function and exclude any structural problems. She is advised to avoid caffeine, stimulants.

## 2011-09-12 NOTE — Patient Instructions (Signed)
Your physician recommends that you schedule a follow-up appointment in: 2-3 weeks.   Your physician has requested that you have an echocardiogram. Echocardiography is a painless test that uses sound waves to create images of your heart. It provides your doctor with information about the size and shape of your heart and how well your heart's chambers and valves are working. This procedure takes approximately one hour. There are no restrictions for this procedure.   Your physician has recommended that you wear a holter monitor. Holter monitors are medical devices that record the heart's electrical activity. Doctors most often use these monitors to diagnose arrhythmias. Arrhythmias are problems with the speed or rhythm of the heartbeat. The monitor is a small, portable device. You can wear one while you do your normal daily activities. This is usually used to diagnose what is causing palpitations/syncope (passing out).

## 2011-09-12 NOTE — Progress Notes (Signed)
History of Present Illness:40 yo WF with history of anxiety but no DM, HLD or HTN who is here today for evaluation of palpitations. She tells me that over the last 6-8 weeks, she has noticed her heart skipping beats. She has had some dizziness. She drinks caffeine occasionally. She does not use energy drinks. The palpitations have been occuring daily but have lessened in frequency lately. She had been seen by Juanito Doom in our office in April 2008 for same complaints while she was pregnant. Echo normal at that time.   Past Medical History  Diagnosis Date  . Granuloma annulare     bx confirmed, improved  . Anxiety   . Hx: UTI (urinary tract infection)     in childhood, resolved with urethral stretching  . Palpitations   . Preventative health care 08/26/2011    Past Surgical History  Procedure Date  . Cesarean section   . Cholecystectomy   . Tubal ligation   . Bunionectomy     left  . Tonsillectomy   . Urethral stricture dilatation     childhood    Current Outpatient Prescriptions  Medication Sig Dispense Refill  . ALPRAZolam (XANAX) 0.25 MG tablet 1/2 to 1 tab po bid prn anxiety/insomnia/palp  30 tablet  0  . CLOBETASOL PROPIONATE E 0.05 % emollient cream as needed.         Allergies  Allergen Reactions  . Contrast Media (Iodinated Diagnostic Agents)     History   Social History  . Marital Status: Married    Spouse Name: N/A    Number of Children: N/A  . Years of Education: N/A   Occupational History  . Not on file.   Social History Main Topics  . Smoking status: Never Smoker   . Smokeless tobacco: Never Used  . Alcohol Use: No  . Drug Use: No  . Sexually Active: Yes   Other Topics Concern  . Not on file   Social History Narrative  . No narrative on file    Family History  Problem Relation Age of Onset  . Hypertension Mother   . Depression Father     suicide  . Hypertension Father   . COPD Father   . Hypertension Brother   . Alzheimer's disease  Maternal Grandmother   . Alzheimer's disease Maternal Grandfather   . Heart disease Paternal Grandfather     MI    Review of Systems:  As stated in the HPI and otherwise negative.   BP 101/67  Pulse 70  Resp 18  Ht 5\' 8"  (1.727 m)  Wt 163 lb 12.8 oz (74.299 kg)  BMI 24.91 kg/m2  Physical Examination: General: Well developed, well nourished, NAD HEENT: OP clear, mucus membranes moist SKIN: warm, dry. No rashes. Neuro: No focal deficits Musculoskeletal: Muscle strength 5/5 all ext Psychiatric: Mood and affect normal Neck: No JVD, no carotid bruits, no thyromegaly, no lymphadenopathy. Lungs:Clear bilaterally, no wheezes, rhonci, crackles Cardiovascular: Regular rate and rhythm. No murmurs, gallops or rubs. Abdomen:Soft. Bowel sounds present. Non-tender.  Extremities: No lower extremity edema. Pulses are 2 + in the bilateral DP/PT.  EKG:NSR, rate 67 bpm.

## 2011-09-18 ENCOUNTER — Ambulatory Visit (HOSPITAL_COMMUNITY): Payer: BC Managed Care – PPO | Attending: Cardiovascular Disease

## 2011-09-18 ENCOUNTER — Encounter: Payer: Self-pay | Admitting: *Deleted

## 2011-09-18 ENCOUNTER — Encounter (INDEPENDENT_AMBULATORY_CARE_PROVIDER_SITE_OTHER): Payer: BC Managed Care – PPO

## 2011-09-18 DIAGNOSIS — F411 Generalized anxiety disorder: Secondary | ICD-10-CM | POA: Insufficient documentation

## 2011-09-18 DIAGNOSIS — R002 Palpitations: Secondary | ICD-10-CM

## 2011-09-25 LAB — CBC
HCT: 26.5 — ABNORMAL LOW
Hemoglobin: 10.9 — ABNORMAL LOW
Hemoglobin: 9.1 — ABNORMAL LOW
Platelets: 230
RBC: 3.52 — ABNORMAL LOW
WBC: 14.6 — ABNORMAL HIGH
WBC: 8.5

## 2011-09-25 LAB — RAPID HIV SCREEN (WH-MAU): Rapid HIV Screen: NONREACTIVE

## 2011-10-02 ENCOUNTER — Other Ambulatory Visit: Payer: Self-pay | Admitting: Obstetrics and Gynecology

## 2011-10-02 DIAGNOSIS — N644 Mastodynia: Secondary | ICD-10-CM

## 2011-10-02 DIAGNOSIS — N6459 Other signs and symptoms in breast: Secondary | ICD-10-CM

## 2011-10-13 ENCOUNTER — Ambulatory Visit: Payer: BC Managed Care – PPO | Admitting: Cardiovascular Disease

## 2011-10-14 ENCOUNTER — Ambulatory Visit
Admission: RE | Admit: 2011-10-14 | Discharge: 2011-10-14 | Disposition: A | Discharge status: Home / Self Care | Payer: BC Managed Care – PPO | Source: Ambulatory Visit | Attending: Obstetrics and Gynecology | Admitting: Obstetrics and Gynecology

## 2011-10-14 ENCOUNTER — Other Ambulatory Visit: Payer: Self-pay | Admitting: Obstetrics and Gynecology

## 2011-10-14 DIAGNOSIS — N6459 Other signs and symptoms in breast: Secondary | ICD-10-CM

## 2011-10-14 DIAGNOSIS — N644 Mastodynia: Secondary | ICD-10-CM

## 2011-10-27 ENCOUNTER — Encounter: Payer: Self-pay | Admitting: Family Medicine

## 2011-10-27 ENCOUNTER — Ambulatory Visit (INDEPENDENT_AMBULATORY_CARE_PROVIDER_SITE_OTHER): Payer: BC Managed Care – PPO | Admitting: Family Medicine

## 2011-10-27 DIAGNOSIS — F32A Depression, unspecified: Secondary | ICD-10-CM

## 2011-10-27 DIAGNOSIS — F411 Generalized anxiety disorder: Secondary | ICD-10-CM

## 2011-10-27 DIAGNOSIS — R002 Palpitations: Secondary | ICD-10-CM

## 2011-10-27 DIAGNOSIS — F329 Major depressive disorder, single episode, unspecified: Secondary | ICD-10-CM

## 2011-10-27 DIAGNOSIS — F419 Anxiety disorder, unspecified: Secondary | ICD-10-CM

## 2011-10-27 DIAGNOSIS — R350 Frequency of micturition: Secondary | ICD-10-CM

## 2011-10-27 HISTORY — DX: Depression, unspecified: F32.A

## 2011-10-27 HISTORY — DX: Anxiety disorder, unspecified: F41.9

## 2011-10-27 MED ORDER — ALPRAZOLAM 0.25 MG PO TABS: ORAL_TABLET | ORAL | Status: DC

## 2011-10-27 MED ORDER — CITALOPRAM HYDROBROMIDE 20 MG PO TABS: 20.0000 mg | ORAL_TABLET | Freq: Every day | ORAL | Status: DC

## 2011-10-27 NOTE — Assessment & Plan Note (Signed)
Patient is noting worsening irritability and crying, difficulty with concentration and sleep persist. The Alprazolam helps her fall asleep and she has not tried it during the day. She reports only taking her Sertraline sporadically in the past but she did not think it helped. We will try Citalopram 10mg  po qd x 7 days and then increase to 20mg  daily, May use Alprazolam qhs and sparingly during the day for an anxiety attack,

## 2011-10-27 NOTE — Assessment & Plan Note (Signed)
Had a recent echo which was unremarkable, these are improved and she is more reassured that these are directly related to her anxiety and depression which is worsening again.

## 2011-10-27 NOTE — Progress Notes (Signed)
[name] [id number] 1971-04-12 10/27/2011      Progress Note-Follow Up  Subjective  Chief Complaint  Chief Complaint  Patient presents with  . Follow-up    2 month follow up    HPI  Patient is a 40 yo female in today for evaluation of worsening anxiety and depression. She was noting some palpitations at her last visit which are much better now. She did have a echo which was unremarkable and she is now more convinced that they are related to increased anxiety. She is noting worsening irritability, crying easily, anhedonia, poor sleep. She has tried Sertraline in the past and that may have helped slightly but she admits to taking it sporadically. She would like to try something else. No CP/sob/fevers/ congestion/GI or GU c/o  Past Medical History  Diagnosis Date  . Granuloma annulare     bx confirmed, improved  . Anxiety   . Hx: UTI (urinary tract infection)     in childhood, resolved with urethral stretching  . Palpitations   . Preventative health care 08/26/2011  . Anxiety and depression 10/27/2011    Past Surgical History  Procedure Date  . Cesarean section   . Cholecystectomy   . Tubal ligation   . Bunionectomy     left  . Tonsillectomy   . Urethral stricture dilatation     childhood    Family History  Problem Relation Age of Onset  . Hypertension Mother   . Depression Father     suicide  . Hypertension Father   . COPD Father   . Hypertension Brother   . Alzheimer's disease Maternal Grandmother   . Alzheimer's disease Maternal Grandfather   . Heart disease Paternal Grandfather     MI    History   Social History  . Marital Status: Married    Spouse Name: N/A    Number of Children: N/A  . Years of Education: N/A   Occupational History  . Not on file.   Social History Main Topics  . Smoking status: Never Smoker   . Smokeless tobacco: Never Used  . Alcohol Use: No  . Drug Use: No  . Sexually Active: Yes   Other Topics Concern  . Not  on file   Social History Narrative  . No narrative on file    Current Outpatient Prescriptions on File Prior to Visit  Medication Sig Dispense Refill  . CLOBETASOL PROPIONATE E 0.05 % emollient cream as needed.         Allergies  Allergen Reactions  . Contrast Media (Iodinated Diagnostic Agents)     Review of Systems  Review of Systems  Constitutional: Negative for fever and malaise/fatigue.  HENT: Negative for congestion.   Eyes: Negative for discharge.  Respiratory: Negative for shortness of breath.   Cardiovascular: Positive for palpitations. Negative for chest pain and leg swelling.       Improved dramatically  Gastrointestinal: Negative for nausea, abdominal pain and diarrhea.  Genitourinary: Negative for dysuria.  Musculoskeletal: Negative for falls.  Skin: Negative for rash.  Neurological: Negative for loss of consciousness and headaches.  Endo/Heme/Allergies: Negative for polydipsia.  Psychiatric/Behavioral: Positive for depression. Negative for suicidal ideas. The patient is nervous/anxious and has insomnia.     Objective  BP 119/73  Pulse 71  Temp(Src) 97.8 F (36.6 C) (Oral)  Ht 5' 8.5" (1.74 m)  Wt 169 lb 12.8 oz (77.021 kg)  BMI 25.44 kg/m2  SpO2 98%  LMP 10/19/2011  Physical Exam  Physical  Exam  Constitutional: She is oriented to person, place, and time and well-developed, well-nourished, and in no distress. No distress.  HENT:  Head: Normocephalic and atraumatic.  Eyes: Conjunctivae are normal.  Neck: Neck supple. No thyromegaly present.  Cardiovascular: Normal rate, regular rhythm and normal heart sounds.   No murmur heard. Pulmonary/Chest: Effort normal and breath sounds normal. She has no wheezes.  Abdominal: She exhibits no distension and no mass.  Musculoskeletal: She exhibits no edema.  Lymphadenopathy:    She has no cervical adenopathy.  Neurological: She is alert and oriented to person, place, and time.  Skin: Skin is warm and dry.  No rash noted. She is not diaphoretic.  Psychiatric: Memory, affect and judgment normal.    Lab Results  Component Value Date   TSH 1.90 08/27/2011   Lab Results  Component Value Date   WBC 4.9 08/27/2011   HGB 13.4 08/27/2011   HCT 40.2 08/27/2011   MCV 89.5 08/27/2011   PLT 259.0 08/27/2011   Lab Results  Component Value Date   CREATININE 1.0 08/27/2011   BUN 14 08/27/2011   NA 138 08/27/2011   K 4.5 08/27/2011   CL 104 08/27/2011   CO2 28 08/27/2011   Lab Results  Component Value Date   ALT 11 08/27/2011   AST 19 08/27/2011   ALKPHOS 69 08/27/2011   BILITOT 0.3 08/27/2011   Lab Results  Component Value Date   CHOL 169 08/27/2011   Lab Results  Component Value Date   HDL 55.10 08/27/2011   Lab Results  Component Value Date   LDLCALC 103* 08/27/2011   Lab Results  Component Value Date   TRIG 53.0 08/27/2011   Lab Results  Component Value Date   CHOLHDL 3 08/27/2011     Assessment & Plan  Palpitations Had a recent echo which was unremarkable, these are improved and she is more reassured that these are directly related to her anxiety and depression which is worsening again.  Anxiety and depression Patient is noting worsening irritability and crying, difficulty with concentration and sleep persist. The Alprazolam helps her fall asleep and she has not tried it during the day. She reports only taking her Sertraline sporadically in the past but she did not think it helped. We will try Citalopram 10mg  po qd x 7 days and then increase to 20mg  daily, May use Alprazolam qhs and sparingly during the day for an anxiety attack,

## 2011-10-27 NOTE — Patient Instructions (Signed)

## 2011-10-29 ENCOUNTER — Ambulatory Visit: Payer: BC Managed Care – PPO | Admitting: Cardiovascular Disease

## 2011-11-13 ENCOUNTER — Encounter: Payer: Self-pay | Admitting: Cardiovascular Disease

## 2011-11-13 ENCOUNTER — Ambulatory Visit (INDEPENDENT_AMBULATORY_CARE_PROVIDER_SITE_OTHER): Payer: BC Managed Care – PPO | Admitting: Cardiovascular Disease

## 2011-11-13 VITALS — BP 110/66 | HR 60 | Ht 68.0 in | Wt 167.4 lb

## 2011-11-13 DIAGNOSIS — I493 Ventricular premature depolarization: Secondary | ICD-10-CM

## 2011-11-13 DIAGNOSIS — I4949 Other premature depolarization: Secondary | ICD-10-CM

## 2011-11-13 NOTE — Patient Instructions (Signed)
Your physician recommends that you schedule a follow-up appointment as needed  

## 2011-11-13 NOTE — Assessment & Plan Note (Signed)
She has PVCs and normal LV function. No further workup. This is benign. I have reassured her. If she becomes more symptomatic, we could consider therapy with a calcium channel blocker or a beta blocker. She does not wish to start therapy at this time. I agree with this. I will see her as needed.

## 2011-11-13 NOTE — Progress Notes (Signed)
History of Present Illness:40 yo WF with history of anxiety but no DM, HLD or HTN who is here today for cardiac follow up. I saw her 09/12/11 for evaluation of palpitations. She told  me that she had noticed her heart skipping beats. She had some dizziness. She drinks caffeine occasionally. She does not use energy drinks. The palpitations were in the setting of stress and anxiety. She had been seen by Juanito Doom in our office in April 2008 for same complaints while she was pregnant. Echo normal at that time. I arranged an echo and a 48 hour Holter monitor. Her echo showed normal LV size and function with LVEF of 55-65%. Her Holter Monitor showed Normal sinus rhythm with frequent PVCs and one 4 beat run of non-sustained VT.   She tells me today that she feels much better. No real awareness of palpitations. NO chest pain or SOB.    Past Medical History  Diagnosis Date  . Granuloma annulare     bx confirmed, improved  . Anxiety   . Hx: UTI (urinary tract infection)     in childhood, resolved with urethral stretching  . Palpitations   . Preventative health care 08/26/2011  . Anxiety and depression 10/27/2011    Past Surgical History  Procedure Date  . Cesarean section   . Cholecystectomy   . Tubal ligation   . Bunionectomy     left  . Tonsillectomy   . Urethral stricture dilatation     childhood    Current Outpatient Prescriptions  Medication Sig Dispense Refill  . ALPRAZolam (XANAX) 0.25 MG tablet 1/2 to 1 tab po bid prn anxiety/insomnia/palp  40 tablet  0  . citalopram (CELEXA) 20 MG tablet Take 1 tablet (20 mg total) by mouth daily.  30 tablet  2  . CLOBETASOL PROPIONATE E 0.05 % emollient cream as needed.         Allergies  Allergen Reactions  . Contrast Media (Iodinated Diagnostic Agents)     History   Social History  . Marital Status: Married    Spouse Name: N/A    Number of Children: N/A  . Years of Education: N/A   Occupational History  . Not on file.   Social  History Main Topics  . Smoking status: Never Smoker   . Smokeless tobacco: Never Used  . Alcohol Use: No  . Drug Use: No  . Sexually Active: Yes   Other Topics Concern  . Not on file   Social History Narrative  . No narrative on file    Family History  Problem Relation Age of Onset  . Hypertension Mother   . Depression Father     suicide  . Hypertension Father   . COPD Father   . Hypertension Brother   . Alzheimer's disease Maternal Grandmother   . Alzheimer's disease Maternal Grandfather   . Heart disease Paternal Grandfather     MI    Review of Systems:  As stated in the HPI and otherwise negative.   BP 110/66  Pulse 60  Ht 5\' 8"  (1.727 m)  Wt 167 lb 6.4 oz (75.932 kg)  BMI 25.45 kg/m2  LMP 10/19/2011  Physical Examination: General: Well developed, well nourished, NAD HEENT: OP clear, mucus membranes moist SKIN: warm, dry. No rashes. Neuro: No focal deficits Musculoskeletal: Muscle strength 5/5 all ext Psychiatric: Mood and affect normal Neck: No JVD, no carotid bruits, no thyromegaly, no lymphadenopathy. Lungs:Clear bilaterally, no wheezes, rhonci, crackles Cardiovascular: Regular rate and  rhythm. No murmurs, gallops or rubs. Abdomen:Soft. Bowel sounds present. Non-tender.  Extremities: No lower extremity edema. Pulses are 2 + in the bilateral DP/PT.  48 Hour Holter Monitor: 09/18/11:  Frequent PVCs. One 4 beat run of non-sustained VT.   Echo: 09/18/11:  Left ventricle: The cavity size was normal. Wall thickness was normal. Systolic function was normal. The estimated ejection fraction was in the range of 55% to 65%. Wall motion was normal; there were no regional wall motion abnormalities. Left ventricular diastolic function parameters were normal. - Pericardium, extracardiac: A trivial pericardial effusion was identified.

## 2011-12-16 ENCOUNTER — Ambulatory Visit: Payer: BC Managed Care – PPO | Admitting: Family Medicine

## 2011-12-24 ENCOUNTER — Ambulatory Visit: Payer: BC Managed Care – PPO | Admitting: Family Medicine

## 2012-02-20 ENCOUNTER — Other Ambulatory Visit: Payer: Self-pay | Admitting: Family Medicine

## 2012-04-06 ENCOUNTER — Ambulatory Visit (INDEPENDENT_AMBULATORY_CARE_PROVIDER_SITE_OTHER): Payer: BC Managed Care – PPO | Admitting: Family Medicine

## 2012-04-06 ENCOUNTER — Encounter: Payer: Self-pay | Admitting: Family Medicine

## 2012-04-06 VITALS — BP 109/70 | HR 75 | Temp 98.7°F | Ht 68.5 in | Wt 173.1 lb

## 2012-04-06 DIAGNOSIS — F411 Generalized anxiety disorder: Secondary | ICD-10-CM

## 2012-04-06 DIAGNOSIS — F32A Depression, unspecified: Secondary | ICD-10-CM

## 2012-04-06 DIAGNOSIS — F329 Major depressive disorder, single episode, unspecified: Secondary | ICD-10-CM

## 2012-04-06 DIAGNOSIS — M79674 Pain in right toe(s): Secondary | ICD-10-CM

## 2012-04-06 DIAGNOSIS — M79609 Pain in unspecified limb: Secondary | ICD-10-CM

## 2012-04-06 DIAGNOSIS — G4700 Insomnia, unspecified: Secondary | ICD-10-CM

## 2012-04-06 DIAGNOSIS — F419 Anxiety disorder, unspecified: Secondary | ICD-10-CM

## 2012-04-06 DIAGNOSIS — F341 Dysthymic disorder: Secondary | ICD-10-CM

## 2012-04-06 DIAGNOSIS — R002 Palpitations: Secondary | ICD-10-CM

## 2012-04-06 MED ORDER — ALPRAZOLAM 0.25 MG PO TABS: ORAL_TABLET | ORAL | Status: DC

## 2012-04-06 MED ORDER — CITALOPRAM HYDROBROMIDE 20 MG PO TABS: 20.0000 mg | ORAL_TABLET | Freq: Every day | ORAL | Status: DC

## 2012-04-06 NOTE — Patient Instructions (Addendum)
Anxiety and Panic Attacks Your caregiver has informed you that you are having an anxiety or panic attack. There may be many forms of this. Most of the time these attacks come suddenly and without warning. They come at any time of day, including periods of sleep, and at any time of life. They may be strong and unexplained. Although panic attacks are very scary, they are physically harmless. Sometimes the cause of your anxiety is not known. Anxiety is a protective mechanism of the body in its fight or flight mechanism. Most of these perceived danger situations are actually nonphysical situations (such as anxiety over losing a job). CAUSES  The causes of an anxiety or panic attack are many. Panic attacks may occur in otherwise healthy people given a certain set of circumstances. There may be a genetic cause for panic attacks. Some medications may also have anxiety as a side effect. SYMPTOMS  Some of the most common feelings are:  Intense terror.   Dizziness, feeling faint.   Hot and cold flashes.   Fear of going crazy.   Feelings that nothing is real.   Sweating.   Shaking.   Chest pain or a fast heartbeat (palpitations).   Smothering, choking sensations.   Feelings of impending doom and that death is near.   Tingling of extremities, this may be from over-breathing.   Altered reality (derealization).   Being detached from yourself (depersonalization).  Several symptoms can be present to make up anxiety or panic attacks. DIAGNOSIS  The evaluation by your caregiver will depend on the type of symptoms you are experiencing. The diagnosis of anxiety or panic attack is made when no physical illness can be determined to be a cause of the symptoms. TREATMENT  Treatment to prevent anxiety and panic attacks may include:  Avoidance of circumstances that cause anxiety.   Reassurance and relaxation.   Regular exercise.   Relaxation therapies, such as yoga.   Psychotherapy with a  psychiatrist or therapist.   Avoidance of caffeine, alcohol and illegal drugs.   Prescribed medication.  SEEK IMMEDIATE MEDICAL CARE IF:   You experience panic attack symptoms that are different than your usual symptoms.   You have any worsening or concerning symptoms.  Document Released: 11/24/2005 Document Revised: 11/13/2011 Document Reviewed: 03/28/2010 [facility] Patient Information 2012 [place], [place].Bunion You have a bunion deformity of the feet. This is more common in women. It tends to be an inherited problem. Symptoms can include pain, swelling, and deformity around the great toe. Numbness and tingling may also be present. Your symptoms are often worsened by wearing shoes that cause pressure on the bunion. Changing the type of shoes you wear helps reduce symptoms. A wide shoe decreases pressure on the bunion. An arch support may be used if you have flat feet. Avoid shoes with heels higher than two inches. This puts more pressure on the bunion. X-rays may be helpful in evaluating the severity of the problem. Other foot problems often seen with bunions include corns, calluses, and hammer toes. If the deformity or pain is severe, surgical treatment may be necessary. Keep off your painful foot as much as possible until the pain is relieved. Call your caregiver if your symptoms are worse.  SEEK IMMEDIATE MEDICAL CARE IF:  You have increased redness, pain, swelling, or other symptoms of infection. Document Released: 11/24/2005 Document Revised: 11/13/2011 Document Reviewed: 05/24/2007 [facility] Patient Information 2012 [place], [place].

## 2012-04-07 ENCOUNTER — Encounter: Payer: Self-pay | Admitting: Family Medicine

## 2012-04-07 DIAGNOSIS — M79674 Pain in right toe(s): Secondary | ICD-10-CM

## 2012-04-07 HISTORY — DX: Pain in right toe(s): M79.674

## 2012-04-07 NOTE — Assessment & Plan Note (Signed)
Since returning from a trip with increased walking she has been struggling with increased right great toe pain with pain radiating into arch. She is encouragd to change her footwear. Try stretching, icing and applying Aspercreme to foot and let us know if no imrpovment

## 2012-04-07 NOTE — Assessment & Plan Note (Signed)
Good response to Citalopram, will continue the same, given refills today

## 2012-04-07 NOTE — Progress Notes (Signed)
Patient ID: [name], female   DOB: 1971-10-08, 41 y.o.   MRN: [id number] ONETA SIGMAN [id number] 02-27-1971 04/07/2012      Progress Note-Follow Up  Subjective  Chief Complaint  Chief Complaint  Patient presents with  . Follow-up    on meds  . pain in big toe    right foot X a couple of months    HPI  Patient is a 41 year old Caucasian female who is in today for followup. She reports S-Citalopram is working well and she feels less anxious and depressed. Less anhedonia. No suicidal ideation. Overall she feels much calmer. She is complaining of right great toe pain radiating into her right arch. It is worse and she went on a trip with increased walking. No injury redness or swelling. She denies any recent palpitations chest pain, sob, gi or gu c/o.  Past Medical History  Diagnosis Date  . Granuloma annulare     bx confirmed, improved  . Anxiety   . Hx: UTI (urinary tract infection)     in childhood, resolved with urethral stretching  . Palpitations   . Preventative health care 08/26/2011  . Anxiety and depression 10/27/2011  . Toe pain, right 04/07/2012    Past Surgical History  Procedure Date  . Cesarean section   . Cholecystectomy   . Tubal ligation   . Bunionectomy     left  . Tonsillectomy   . Urethral stricture dilatation     childhood    Family History  Problem Relation Age of Onset  . Hypertension Mother   . Depression Father     suicide  . Hypertension Father   . COPD Father     smoker  . Alzheimer's disease Maternal Grandmother   . Alzheimer's disease Maternal Grandfather   . Heart disease Paternal Grandfather     MI  . Hypertension Brother     History   Social History  . Marital Status: Married    Spouse Name: N/A    Number of Children: N/A  . Years of Education: N/A   Occupational History  . Not on file.   Social History Main Topics  . Smoking status: Never Smoker   . Smokeless tobacco: Never Used  . Alcohol Use: No  .  Drug Use: No  . Sexually Active: Yes   Other Topics Concern  . Not on file   Social History Narrative  . No narrative on file    Current Outpatient Prescriptions on File Prior to Visit  Medication Sig Dispense Refill  . citalopram (CELEXA) 20 MG tablet Take 1 tablet (20 mg total) by mouth daily.  90 tablet  3  . CLOBETASOL PROPIONATE E 0.05 % emollient cream as needed.         Allergies  Allergen Reactions  . Contrast Media (Iodinated Diagnostic Agents)     Review of Systems  Review of Systems  Constitutional: Negative for fever and malaise/fatigue.  HENT: Negative for congestion.   Eyes: Negative for discharge.  Respiratory: Negative for shortness of breath.   Cardiovascular: Negative for chest pain, palpitations and leg swelling.  Gastrointestinal: Negative for nausea, abdominal pain and diarrhea.  Genitourinary: Negative for dysuria.  Musculoskeletal: Positive for joint pain. Negative for falls.       Right great toe pain  Skin: Negative for rash.  Neurological: Negative for loss of consciousness and headaches.  Endo/Heme/Allergies: Negative for polydipsia.  Psychiatric/Behavioral: Negative for depression and suicidal ideas. The patient is not nervous/anxious and  does not have insomnia.     Objective  BP 109/70  Pulse 75  Temp(Src) 98.7 F (37.1 C) (Temporal)  Ht 5' 8.5" (1.74 m)  Wt 173 lb 1.9 oz (78.527 kg)  BMI 25.94 kg/m2  SpO2 100%  LMP 03/14/2012  Physical Exam  Physical Exam  Constitutional: She is oriented to person, place, and time and well-developed, well-nourished, and in no distress. No distress.  HENT:  Head: Normocephalic and atraumatic.  Eyes: Conjunctivae are normal.  Neck: Neck supple. No thyromegaly present.  Cardiovascular: Normal rate, regular rhythm and normal heart sounds.   No murmur heard. Pulmonary/Chest: Effort normal and breath sounds normal. She has no wheezes.  Abdominal: She exhibits no distension and no mass.    Musculoskeletal: She exhibits no edema.  Lymphadenopathy:    She has no cervical adenopathy.  Neurological: She is alert and oriented to person, place, and time.  Skin: Skin is warm and dry. No rash noted. She is not diaphoretic.  Psychiatric: Memory, affect and judgment normal.    Lab Results  Component Value Date   TSH 1.90 08/27/2011   Lab Results  Component Value Date   WBC 4.9 08/27/2011   HGB 13.4 08/27/2011   HCT 40.2 08/27/2011   MCV 89.5 08/27/2011   PLT 259.0 08/27/2011   Lab Results  Component Value Date   CREATININE 1.0 08/27/2011   BUN 14 08/27/2011   NA 138 08/27/2011   K 4.5 08/27/2011   CL 104 08/27/2011   CO2 28 08/27/2011   Lab Results  Component Value Date   ALT 11 08/27/2011   AST 19 08/27/2011   ALKPHOS 69 08/27/2011   BILITOT 0.3 08/27/2011   Lab Results  Component Value Date   CHOL 169 08/27/2011   Lab Results  Component Value Date   HDL 55.10 08/27/2011   Lab Results  Component Value Date   LDLCALC 103* 08/27/2011   Lab Results  Component Value Date   TRIG 53.0 08/27/2011   Lab Results  Component Value Date   CHOLHDL 3 08/27/2011     Assessment & Plan  Toe pain, right Since returning from a trip with increased walking she has been struggling with increased right great toe pain with pain radiating into arch. She is encouragd to change her footwear. Try stretching, icing and applying Aspercreme to foot and let us know if no imrpovment  Anxiety and depression Good response to Citalopram, will continue the same, given refills today

## 2013-01-03 ENCOUNTER — Ambulatory Visit: Payer: BC Managed Care – PPO

## 2013-01-05 ENCOUNTER — Encounter: Payer: BC Managed Care – PPO | Admitting: Family Medicine

## 2013-01-05 ENCOUNTER — Ambulatory Visit (INDEPENDENT_AMBULATORY_CARE_PROVIDER_SITE_OTHER): Payer: BC Managed Care – PPO

## 2013-01-05 DIAGNOSIS — Z23 Encounter for immunization: Secondary | ICD-10-CM

## 2013-01-07 ENCOUNTER — Encounter: Payer: Self-pay | Admitting: Family Medicine

## 2013-01-07 ENCOUNTER — Ambulatory Visit (INDEPENDENT_AMBULATORY_CARE_PROVIDER_SITE_OTHER): Payer: BC Managed Care – PPO | Admitting: Family Medicine

## 2013-01-07 VITALS — BP 118/74 | HR 74 | Temp 99.2°F | Ht 68.5 in | Wt 184.8 lb

## 2013-01-07 DIAGNOSIS — F32A Depression, unspecified: Secondary | ICD-10-CM

## 2013-01-07 DIAGNOSIS — F411 Generalized anxiety disorder: Secondary | ICD-10-CM

## 2013-01-07 DIAGNOSIS — R300 Dysuria: Secondary | ICD-10-CM

## 2013-01-07 DIAGNOSIS — R319 Hematuria, unspecified: Secondary | ICD-10-CM

## 2013-01-07 DIAGNOSIS — Z87440 Personal history of urinary (tract) infections: Secondary | ICD-10-CM

## 2013-01-07 DIAGNOSIS — G4700 Insomnia, unspecified: Secondary | ICD-10-CM

## 2013-01-07 DIAGNOSIS — R002 Palpitations: Secondary | ICD-10-CM

## 2013-01-07 DIAGNOSIS — J069 Acute upper respiratory infection, unspecified: Secondary | ICD-10-CM

## 2013-01-07 DIAGNOSIS — F419 Anxiety disorder, unspecified: Secondary | ICD-10-CM

## 2013-01-07 DIAGNOSIS — Z0000 Encounter for general adult medical examination without abnormal findings: Secondary | ICD-10-CM

## 2013-01-07 DIAGNOSIS — E785 Hyperlipidemia, unspecified: Secondary | ICD-10-CM

## 2013-01-07 DIAGNOSIS — F3289 Other specified depressive episodes: Secondary | ICD-10-CM

## 2013-01-07 DIAGNOSIS — F341 Dysthymic disorder: Secondary | ICD-10-CM

## 2013-01-07 DIAGNOSIS — F329 Major depressive disorder, single episode, unspecified: Secondary | ICD-10-CM

## 2013-01-07 LAB — CBC
Hemoglobin: 12.5 g/dL (ref 12.0–15.0)
RDW: 12.2 % (ref 11.5–14.6)
WBC: 4.5 10*3/uL (ref 4.5–10.5)

## 2013-01-07 LAB — TB SKIN TEST: Induration: 48 mm

## 2013-01-07 LAB — LIPID PANEL
Cholesterol: 160 mg/dL (ref 0–200)
HDL: 45.1 mg/dL (ref >39.00)
Triglycerides: 92 mg/dL (ref 0.0–149.0)
VLDL: 18.4 mg/dL (ref 0.0–40.0)

## 2013-01-07 LAB — TSH: TSH: 0.45 u[IU]/mL (ref 0.35–5.50)

## 2013-01-07 LAB — HEPATIC FUNCTION PANEL
ALT: 14 U/L (ref 0–35)
Albumin: 4 g/dL (ref 3.5–5.2)
Total Bilirubin: 0.3 mg/dL (ref 0.3–1.2)
Total Protein: 7 g/dL (ref 6.0–8.3)

## 2013-01-07 LAB — POCT URINALYSIS DIPSTICK
Leukocytes, UA: NEGATIVE
Nitrite, UA: NEGATIVE
Protein, UA: NEGATIVE
Urobilinogen, UA: 0.2
pH, UA: 6

## 2013-01-07 LAB — RENAL FUNCTION PANEL
CO2: 30 mEq/L (ref 19–32)
Chloride: 102 mEq/L (ref 96–112)
Creatinine, Ser: 0.9 mg/dL (ref 0.4–1.2)
GFR: 69.08 mL/min (ref >60.00)
Phosphorus: 2.8 mg/dL (ref 2.3–4.6)
Sodium: 136 mEq/L (ref 135–145)

## 2013-01-07 MED ORDER — ALPRAZOLAM 0.25 MG PO TABS: ORAL_TABLET | ORAL | Status: DC

## 2013-01-07 MED ORDER — CIPROFLOXACIN HCL 500 MG PO TABS: 500.0000 mg | ORAL_TABLET | Freq: Two times a day (BID) | ORAL | Status: DC

## 2013-01-07 MED ORDER — CITALOPRAM HYDROBROMIDE 20 MG PO TABS: 20.0000 mg | ORAL_TABLET | Freq: Every day | ORAL | Status: DC

## 2013-01-07 NOTE — Patient Instructions (Addendum)
Probiotic gummy is called [employer] by [employer] krill oil caps 1 daily Preventive Care for Adults, Female A healthy lifestyle and preventive care can promote health and wellness. Preventive health guidelines for women include the following key practices.  A routine yearly physical is a good way to check with your caregiver about your health and preventive screening. It is a chance to share any concerns and updates on your health, and to receive a thorough exam.  Visit your dentist for a routine exam and preventive care every 6 months. Brush your teeth twice a day and floss once a day. Good oral hygiene prevents tooth decay and gum disease.  The frequency of eye exams is based on your age, health, family medical history, use of contact lenses, and other factors. Follow your caregiver's recommendations for frequency of eye exams.  Eat a healthy diet. Foods like vegetables, fruits, whole grains, low-fat dairy products, and lean protein foods contain the nutrients you need without too many calories. Decrease your intake of foods high in solid fats, added sugars, and salt. Eat the right amount of calories for you.Get information about a proper diet from your caregiver, if necessary.  Regular physical exercise is one of the most important things you can do for your health. Most adults should get at least 150 minutes of moderate-intensity exercise (any activity that increases your heart rate and causes you to sweat) each week. In addition, most adults need muscle-strengthening exercises on 2 or more days a week.  Maintain a healthy weight. The body mass index (BMI) is a screening tool to identify possible weight problems. It provides an estimate of body fat based on height and weight. Your caregiver can help determine your BMI, and can help you achieve or maintain a healthy weight.For adults 20 years and older:  A BMI below 18.5 is considered underweight.  A BMI of 18.5 to 24.9 is  normal.  A BMI of 25 to 29.9 is considered overweight.  A BMI of 30 and above is considered obese.  Maintain normal blood lipids and cholesterol levels by exercising and minimizing your intake of saturated fat. Eat a balanced diet with plenty of fruit and vegetables. Blood tests for lipids and cholesterol should begin at age 3 and be repeated every 5 years. If your lipid or cholesterol levels are high, you are over 50, or you are at high risk for heart disease, you may need your cholesterol levels checked more frequently.Ongoing high lipid and cholesterol levels should be treated with medicines if diet and exercise are not effective.  If you smoke, find out from your caregiver how to quit. If you do not use tobacco, do not start.  If you are pregnant, do not drink alcohol. If you are breastfeeding, be very cautious about drinking alcohol. If you are not pregnant and choose to drink alcohol, do not exceed 1 drink per day. One drink is considered to be 12 ounces (355 mL) of beer, 5 ounces (148 mL) of wine, or 1.5 ounces (44 mL) of liquor.  Avoid use of street drugs. Do not share needles with anyone. Ask for help if you need support or instructions about stopping the use of drugs.  High blood pressure causes heart disease and increases the risk of stroke. Your blood pressure should be checked at least every 1 to 2 years. Ongoing high blood pressure should be treated with medicines if weight loss and exercise are not effective.  If you are 55 to 43 years  old, ask your caregiver if you should take aspirin to prevent strokes.  Diabetes screening involves taking a blood sample to check your fasting blood sugar level. This should be done once every 3 years, after age 42, if you are within normal weight and without risk factors for diabetes. Testing should be considered at a younger age or be carried out more frequently if you are overweight and have at least 1 risk factor for diabetes.  Breast cancer  screening is essential preventive care for women. You should practice "breast self-awareness." This means understanding the normal appearance and feel of your breasts and may include breast self-examination. Any changes detected, no matter how small, should be reported to a caregiver. Women in their 18s and 30s should have a clinical breast exam (CBE) by a caregiver as part of a regular health exam every 1 to 3 years. After age 39, women should have a CBE every year. Starting at age 21, women should consider having a mammography (breast X-ray test) every year. Women who have a family history of breast cancer should talk to their caregiver about genetic screening. Women at a high risk of breast cancer should talk to their caregivers about having magnetic resonance imaging (MRI) and a mammography every year.  The Pap test is a screening test for cervical cancer. A Pap test can show cell changes on the cervix that might become cervical cancer if left untreated. A Pap test is a procedure in which cells are obtained and examined from the lower end of the uterus (cervix).  Women should have a Pap test starting at age 68.  Between ages 46 and 67, Pap tests should be repeated every 2 years.  Beginning at age 70, you should have a Pap test every 3 years as long as the past 3 Pap tests have been normal.  Some women have medical problems that increase the chance of getting cervical cancer. Talk to your caregiver about these problems. It is especially important to talk to your caregiver if a new problem develops soon after your last Pap test. In these cases, your caregiver may recommend more frequent screening and Pap tests.  The above recommendations are the same for women who have or have not gotten the vaccine for human papillomavirus (HPV).  If you had a hysterectomy for a problem that was not cancer or a condition that could lead to cancer, then you no longer need Pap tests. Even if you no longer need a Pap  test, a regular exam is a good idea to make sure no other problems are starting.  If you are between ages 49 and 19, and you have had normal Pap tests going back 10 years, you no longer need Pap tests. Even if you no longer need a Pap test, a regular exam is a good idea to make sure no other problems are starting.  If you have had past treatment for cervical cancer or a condition that could lead to cancer, you need Pap tests and screening for cancer for at least 20 years after your treatment.  If Pap tests have been discontinued, risk factors (such as a new sexual partner) need to be reassessed to determine if screening should be resumed.  The HPV test is an additional test that may be used for cervical cancer screening. The HPV test looks for the virus that can cause the cell changes on the cervix. The cells collected during the Pap test can be tested for HPV. The HPV  test could be used to screen women aged 61 years and older, and should be used in women of any age who have unclear Pap test results. After the age of 33, women should have HPV testing at the same frequency as a Pap test.  Colorectal cancer can be detected and often prevented. Most routine colorectal cancer screening begins at the age of 66 and continues through age 73. However, your caregiver may recommend screening at an earlier age if you have risk factors for colon cancer. On a yearly basis, your caregiver may provide home test kits to check for hidden blood in the stool. Use of a small camera at the end of a tube, to directly examine the colon (sigmoidoscopy or colonoscopy), can detect the earliest forms of colorectal cancer. Talk to your caregiver about this at age 102, when routine screening begins. Direct examination of the colon should be repeated every 5 to 10 years through age 75, unless early forms of pre-cancerous polyps or small growths are found.  Hepatitis C blood testing is recommended for all people born from [birth year] through  1965 and any individual with known risks for hepatitis C.  Practice safe sex. Use condoms and avoid high-risk sexual practices to reduce the spread of sexually transmitted infections (STIs). STIs include gonorrhea, chlamydia, syphilis, trichomonas, herpes, HPV, and human immunodeficiency virus (HIV). Herpes, HIV, and HPV are viral illnesses that have no cure. They can result in disability, cancer, and death. Sexually active women aged 62 and younger should be checked for chlamydia. Older women with new or multiple partners should also be tested for chlamydia. Testing for other STIs is recommended if you are sexually active and at increased risk.  Osteoporosis is a disease in which the bones lose minerals and strength with aging. This can result in serious bone fractures. The risk of osteoporosis can be identified using a bone density scan. Women ages 30 and over and women at risk for fractures or osteoporosis should discuss screening with their caregivers. Ask your caregiver whether you should take a calcium supplement or vitamin D to reduce the rate of osteoporosis.  Menopause can be associated with physical symptoms and risks. Hormone replacement therapy is available to decrease symptoms and risks. You should talk to your caregiver about whether hormone replacement therapy is right for you.  Use sunscreen with sun protection factor (SPF) of 30 or more. Apply sunscreen liberally and repeatedly throughout the day. You should seek shade when your shadow is shorter than you. Protect yourself by wearing long sleeves, pants, a wide-brimmed hat, and sunglasses year round, whenever you are outdoors.  Once a month, do a whole body skin exam, using a mirror to look at the skin on your back. Notify your caregiver of new moles, moles that have irregular borders, moles that are larger than a pencil eraser, or moles that have changed in shape or color.  Stay current with required immunizations.  Influenza. You  need a dose every fall (or winter). The composition of the flu vaccine changes each year, so being vaccinated once is not enough.  Pneumococcal polysaccharide. You need 1 to 2 doses if you smoke cigarettes or if you have certain chronic medical conditions. You need 1 dose at age 66 (or older) if you have never been vaccinated.  Tetanus, diphtheria, pertussis (Tdap, Td). Get 1 dose of Tdap vaccine if you are younger than age 54, are over 45 and have contact with an infant, are a Research scientist (physical sciences), are pregnant, or  simply want to be protected from whooping cough. After that, you need a Td booster dose every 10 years. Consult your caregiver if you have not had at least 3 tetanus and diphtheria-containing shots sometime in your life or have a deep or dirty wound.  HPV. You need this vaccine if you are a woman age 16 or younger. The vaccine is given in 3 doses over 6 months.  Measles, mumps, rubella (MMR). You need at least 1 dose of MMR if you were born in 1957 or later. You may also need a second dose.  Meningococcal. If you are age 79 to 34 and a first-year college student living in a residence hall, or have one of several medical conditions, you need to get vaccinated against meningococcal disease. You may also need additional booster doses.  Zoster (shingles). If you are age 80 or older, you should get this vaccine.  Varicella (chickenpox). If you have never had chickenpox or you were vaccinated but received only 1 dose, talk to your caregiver to find out if you need this vaccine.  Hepatitis A. You need this vaccine if you have a specific risk factor for hepatitis A virus infection or you simply wish to be protected from this disease. The vaccine is usually given as 2 doses, 6 to 18 months apart.  Hepatitis B. You need this vaccine if you have a specific risk factor for hepatitis B virus infection or you simply wish to be protected from this disease. The vaccine is given in 3 doses, usually over 6  months. Preventive Services / Frequency Ages 21 to 56  Blood pressure check.** / Every 1 to 2 years.  Lipid and cholesterol check.** / Every 5 years beginning at age 38.  Clinical breast exam.** / Every 3 years for women in their 40s and 30s.  Pap test.** / Every 2 years from ages 41 through 58. Every 3 years starting at age 49 through age 76 or 15 with a history of 3 consecutive normal Pap tests.  HPV screening.** / Every 3 years from ages 18 through ages 34 to 24 with a history of 3 consecutive normal Pap tests.  Hepatitis C blood test.** / For any individual with known risks for hepatitis C.  Skin self-exam. / Monthly.  Influenza immunization.** / Every year.  Pneumococcal polysaccharide immunization.** / 1 to 2 doses if you smoke cigarettes or if you have certain chronic medical conditions.  Tetanus, diphtheria, pertussis (Tdap, Td) immunization. / A one-time dose of Tdap vaccine. After that, you need a Td booster dose every 10 years.  HPV immunization. / 3 doses over 6 months, if you are 43 and younger.  Measles, mumps, rubella (MMR) immunization. / You need at least 1 dose of MMR if you were born in 1957 or later. You may also need a second dose.  Meningococcal immunization. / 1 dose if you are age 43 to 55 and a first-year college student living in a residence hall, or have one of several medical conditions, you need to get vaccinated against meningococcal disease. You may also need additional booster doses.  Varicella immunization.** / Consult your caregiver.  Hepatitis A immunization.** / Consult your caregiver. 2 doses, 6 to 18 months apart.  Hepatitis B immunization.** / Consult your caregiver. 3 doses usually over 6 months. Ages 50 to 82  Blood pressure check.** / Every 1 to 2 years.  Lipid and cholesterol check.** / Every 5 years beginning at age 13.  Clinical breast exam.** /  Every year after age 41.  Mammogram.** / Every year beginning at age 66 and  continuing for as long as you are in good health. Consult with your caregiver.  Pap test.** / Every 3 years starting at age 22 through age 57 or 24 with a history of 3 consecutive normal Pap tests.  HPV screening.** / Every 3 years from ages 32 through ages 52 to 31 with a history of 3 consecutive normal Pap tests.  Fecal occult blood test (FOBT) of stool. / Every year beginning at age 9 and continuing until age 12. You may not need to do this test if you get a colonoscopy every 10 years.  Flexible sigmoidoscopy or colonoscopy.** / Every 5 years for a flexible sigmoidoscopy or every 10 years for a colonoscopy beginning at age 1 and continuing until age 86.  Hepatitis C blood test.** / For all people born from [birth year] through 1965 and any individual with known risks for hepatitis C.  Skin self-exam. / Monthly.  Influenza immunization.** / Every year.  Pneumococcal polysaccharide immunization.** / 1 to 2 doses if you smoke cigarettes or if you have certain chronic medical conditions.  Tetanus, diphtheria, pertussis (Tdap, Td) immunization.** / A one-time dose of Tdap vaccine. After that, you need a Td booster dose every 10 years.  Measles, mumps, rubella (MMR) immunization. / You need at least 1 dose of MMR if you were born in 1957 or later. You may also need a second dose.  Varicella immunization.** / Consult your caregiver.  Meningococcal immunization.** / Consult your caregiver.  Hepatitis A immunization.** / Consult your caregiver. 2 doses, 6 to 18 months apart.  Hepatitis B immunization.** / Consult your caregiver. 3 doses, usually over 6 months. Ages 63 and over  Blood pressure check.** / Every 1 to 2 years.  Lipid and cholesterol check.** / Every 5 years beginning at age 29.  Clinical breast exam.** / Every year after age 1.  Mammogram.** / Every year beginning at age 64 and continuing for as long as you are in good health. Consult with your caregiver.  Pap test.** / Every  3 years starting at age 1 through age 38 or 25 with a 3 consecutive normal Pap tests. Testing can be stopped between 65 and 70 with 3 consecutive normal Pap tests and no abnormal Pap or HPV tests in the past 10 years.  HPV screening.** / Every 3 years from ages 34 through ages 20 or 61 with a history of 3 consecutive normal Pap tests. Testing can be stopped between 65 and 70 with 3 consecutive normal Pap tests and no abnormal Pap or HPV tests in the past 10 years.  Fecal occult blood test (FOBT) of stool. / Every year beginning at age 37 and continuing until age 28. You may not need to do this test if you get a colonoscopy every 10 years.  Flexible sigmoidoscopy or colonoscopy.** / Every 5 years for a flexible sigmoidoscopy or every 10 years for a colonoscopy beginning at age 1 and continuing until age 79.  Hepatitis C blood test.** / For all people born from [birth year] through 1966 and any individual with known risks for hepatitis C.  Osteoporosis screening.** / A one-time screening for women ages 89 and over and women at risk for fractures or osteoporosis.  Skin self-exam. / Monthly.  Influenza immunization.** / Every year.  Pneumococcal polysaccharide immunization.** / 1 dose at age 77 (or older) if you have never been vaccinated.  Tetanus, diphtheria, pertussis (  Tdap, Td) immunization. / A one-time dose of Tdap vaccine if you are over 65 and have contact with an infant, are a Research scientist (physical sciences), or simply want to be protected from whooping cough. After that, you need a Td booster dose every 10 years.  Varicella immunization.** / Consult your caregiver.  Meningococcal immunization.** / Consult your caregiver.  Hepatitis A immunization.** / Consult your caregiver. 2 doses, 6 to 18 months apart.  Hepatitis B immunization.** / Check with your caregiver. 3 doses, usually over 6 months. ** Family history and personal history of risk and conditions may change your caregiver's  recommendations. Document Released: 01/20/2002 Document Revised: 02/16/2012 Document Reviewed: 04/21/2011 [facility] Patient Information 2013 [place], [place].

## 2013-01-09 ENCOUNTER — Encounter: Payer: Self-pay | Admitting: Family Medicine

## 2013-01-09 DIAGNOSIS — J069 Acute upper respiratory infection, unspecified: Secondary | ICD-10-CM

## 2013-01-09 HISTORY — DX: Acute upper respiratory infection, unspecified: J06.9

## 2013-01-09 LAB — URINE CULTURE: Colony Count: 10000

## 2013-01-09 NOTE — Assessment & Plan Note (Signed)
Ciprofloxacin and Mucinex, probiotics

## 2013-01-09 NOTE — Progress Notes (Signed)
Patient ID: [name], female   DOB: 11/04/1971, 42 y.o.   MRN: [id number] [name] [id number] 05/15/1971 01/09/2013      Progress Note New Patient  Subjective  Chief Complaint  Chief Complaint  Patient presents with  . Annual Exam    HPI  Patient is a 42 year old Caucasian female who is in today for annual exam. 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 Offers no acute complaints. Is happy with her response to citalopram and uses alprazolam infrequently. Denies any depression or anxiety at this time. No recent illness, headache, chest pain, palpitations, shortness of breath, GI or GU concerns.  Past Medical History  Diagnosis Date  . Granuloma annulare     bx confirmed, improved  . Anxiety   . Hx: UTI (urinary tract infection)     in childhood, resolved with urethral stretching  . Palpitations   . Preventative health care 08/26/2011  . Anxiety and depression 10/27/2011  . Toe pain, right 04/07/2012  . URI (upper respiratory infection) 01/09/2013    Past Surgical History  Procedure Date  . Cesarean section   . Cholecystectomy   . Tubal ligation   . Bunionectomy     left  . Tonsillectomy   . Urethral stricture dilatation     childhood    Family History  Problem Relation Age of Onset  . Hypertension Mother   . Depression Father     suicide  . Hypertension Father   . COPD Father     smoker  . Alzheimer's disease Maternal Grandmother   . Alzheimer's disease Maternal Grandfather   . Heart disease Paternal Grandfather     MI  . Hypertension Brother     History   Social History  . Marital Status: Married    Spouse Name: N/A    Number of Children: N/A  . Years of Education: N/A   Occupational History  . Not on file.   Social History Main Topics  . Smoking status: Never Smoker   . Smokeless tobacco: Never Used  . Alcohol Use: No  . Drug Use: No  . Sexually Active: Yes   Other Topics Concern  . Not on file   Social History  Narrative  . No narrative on file    Current Outpatient Prescriptions on File Prior to Visit  Medication Sig Dispense Refill  . citalopram (CELEXA) 20 MG tablet Take 1 tablet (20 mg total) by mouth daily.  90 tablet  3    Allergies  Allergen Reactions  . Contrast Media (Iodinated Diagnostic Agents)     Review of Systems  Review of Systems  Constitutional: Negative for fever, chills and malaise/fatigue.  HENT: Positive for congestion and sore throat. Negative for hearing loss and nosebleeds.   Eyes: Negative for discharge.  Respiratory: Positive for cough and sputum production. Negative for shortness of breath and wheezing.   Cardiovascular: Negative for chest pain, palpitations and leg swelling.  Gastrointestinal: Negative for heartburn, nausea, vomiting, abdominal pain, diarrhea, constipation and blood in stool.  Genitourinary: Positive for frequency. Negative for dysuria, urgency and hematuria.  Musculoskeletal: Negative for myalgias, back pain and falls.  Skin: Negative for rash.  Neurological: Negative for dizziness, tremors, sensory change, focal weakness, loss of consciousness, weakness and headaches.  Endo/Heme/Allergies: Negative for polydipsia. Does not bruise/bleed easily.  Psychiatric/Behavioral: Negative for depression and suicidal ideas. The patient is not nervous/anxious and does not have insomnia.     Objective  BP 118/74  Pulse 74  Temp 99.2 F (37.3 C) (Temporal)  Ht 5' 8.5" (1.74 m)  Wt 184 lb 12.8 oz (83.825 kg)  BMI 27.69 kg/m2  SpO2 97%  LMP 01/01/2013  Physical Exam  Physical Exam  Constitutional: She is oriented to person, place, and time and well-developed, well-nourished, and in no distress. No distress.  HENT:  Head: Normocephalic and atraumatic.  Right Ear: External ear normal.  Left Ear: External ear normal.  Nose: Nose normal.  Mouth/Throat: Oropharynx is clear and moist. No oropharyngeal exudate.  Eyes: Conjunctivae normal are normal.  Pupils are equal, round, and reactive to light. Right eye exhibits no discharge. Left eye exhibits no discharge. No scleral icterus.  Neck: Normal range of motion. Neck supple. No thyromegaly present.  Cardiovascular: Normal rate, regular rhythm, normal heart sounds and intact distal pulses.   No murmur heard. Pulmonary/Chest: Effort normal and breath sounds normal. No respiratory distress. She has no wheezes. She has no rales.  Abdominal: Soft. Bowel sounds are normal. She exhibits no distension and no mass. There is no tenderness.  Musculoskeletal: Normal range of motion. She exhibits no edema and no tenderness.  Lymphadenopathy:    She has no cervical adenopathy.  Neurological: She is alert and oriented to person, place, and time. She has normal reflexes. No cranial nerve deficit. Coordination normal.  Skin: Skin is warm and dry. No rash noted. She is not diaphoretic.  Psychiatric: Mood, memory and affect normal.       Assessment & Plan  Preventative health care Mild elevation of ldl cholesterol now resolved, avoid trans fats, consider krill oil caps, encouraged regular exercise and heart healthy diet.  Palpitations No recent episodes.   URI (upper respiratory infection) Ciprofloxacin and Mucinex, probiotics  Hx: UTI (urinary tract infection) Urine appears contaminated on culture  Anxiety and depression Doing well on current dose of Citalopram continue the same

## 2013-01-09 NOTE — Assessment & Plan Note (Signed)
Urine appears contaminated on culture

## 2013-01-09 NOTE — Assessment & Plan Note (Signed)
Mild elevation of ldl cholesterol now resolved, avoid trans fats, consider krill oil caps, encouraged regular exercise and heart healthy diet.

## 2013-01-09 NOTE — Assessment & Plan Note (Signed)
No recent episodes

## 2013-01-09 NOTE — Assessment & Plan Note (Signed)
Doing well on current dose of Citalopram continue the same

## 2013-01-22 ENCOUNTER — Other Ambulatory Visit: Payer: Self-pay

## 2013-02-10 ENCOUNTER — Ambulatory Visit (INDEPENDENT_AMBULATORY_CARE_PROVIDER_SITE_OTHER): Payer: BC Managed Care – PPO | Admitting: Family

## 2013-02-10 ENCOUNTER — Encounter: Payer: Self-pay | Admitting: Family

## 2013-02-10 DIAGNOSIS — H6690 Otitis media, unspecified, unspecified ear: Secondary | ICD-10-CM

## 2013-02-10 MED ORDER — AMOXICILLIN-POT CLAVULANATE 875-125 MG PO TABS: 1.0000 | ORAL_TABLET | Freq: Two times a day (BID) | ORAL | Status: DC

## 2013-02-10 NOTE — Assessment & Plan Note (Signed)
Will rx with augmentin. Suspect associated sinusitis.  Pt is instructed to call if symptoms worsen or if not improved in 2-3 days.

## 2013-02-10 NOTE — Progress Notes (Signed)
  Subjective:    Patient ID: [name], female    DOB: Oct 11, 1971, 42 y.o.   MRN: [id number]  HPI  Ms.  Cannon is a 42 yr old female who presents today with chief complaint of nasal congestion. Symptoms started 2 days ago.  Today she woke up at 4:30 AM with right ear pain. She denies associated fever. She has tried some tylenol cold/sinus without improvement.   Review of Systems See HPI  Past Medical History  Diagnosis Date  . Granuloma annulare     bx confirmed, improved  . Anxiety   . Hx: UTI (urinary tract infection)     in childhood, resolved with urethral stretching  . Palpitations   . Preventative health care 08/26/2011  . Anxiety and depression 10/27/2011  . Toe pain, right 04/07/2012  . URI (upper respiratory infection) 01/09/2013    History   Social History  . Marital Status: Married    Spouse Name: N/A    Number of Children: N/A  . Years of Education: N/A   Occupational History  . Not on file.   Social History Main Topics  . Smoking status: Never Smoker   . Smokeless tobacco: Never Used  . Alcohol Use: No  . Drug Use: No  . Sexually Active: Yes   Other Topics Concern  . Not on file   Social History Narrative  . No narrative on file    Past Surgical History  Procedure Laterality Date  . Cesarean section    . Cholecystectomy    . Tubal ligation    . Bunionectomy      left  . Tonsillectomy    . Urethral stricture dilatation      childhood    Family History  Problem Relation Age of Onset  . Hypertension Mother   . Depression Father     suicide  . Hypertension Father   . COPD Father     smoker  . Alzheimer's disease Maternal Grandmother   . Alzheimer's disease Maternal Grandfather   . Heart disease Paternal Grandfather     MI  . Hypertension Brother     Allergies  Allergen Reactions  . Contrast Media (Iodinated Diagnostic Agents)     Current Outpatient Prescriptions on File Prior to Visit  Medication Sig Dispense Refill  .  ALPRAZolam (XANAX) 0.25 MG tablet 1/2 to 1 tab po bid prn anxiety/insomnia/palp  40 tablet  1  . citalopram (CELEXA) 20 MG tablet Take 1 tablet (20 mg total) by mouth daily.  90 tablet  3   No current facility-administered medications on file prior to visit.    BP 116/70  Pulse 70  Temp(Src) 98.2 F (36.8 C) (Oral)  Resp 16  Wt 183 lb (83.008 kg)  BMI 27.42 kg/m2  SpO2 99%  LMP 01/26/2013       Objective:   Physical Exam  Constitutional: She appears well-developed and well-nourished. No distress.  HENT:  Head: Normocephalic and atraumatic.  Mouth/Throat: No oropharyngeal exudate, posterior oropharyngeal edema or posterior oropharyngeal erythema.  Yellow fluid noted behind bilateral TM's R>L + right maxillary sinus tenderness to palpation  Eyes: No scleral icterus.  Cardiovascular: Normal rate and regular rhythm.   Pulmonary/Chest: Effort normal and breath sounds normal. No respiratory distress. She has no wheezes. She has no rales. She exhibits no tenderness.  Lymphadenopathy:    She has no cervical adenopathy.          Assessment & Plan:

## 2013-02-10 NOTE — Patient Instructions (Addendum)

## 2013-06-17 ENCOUNTER — Encounter: Payer: Self-pay | Admitting: Family Medicine

## 2013-06-17 NOTE — Telephone Encounter (Signed)
See phone note

## 2013-08-01 ENCOUNTER — Ambulatory Visit (HOSPITAL_BASED_OUTPATIENT_CLINIC_OR_DEPARTMENT_OTHER)
Admission: RE | Admit: 2013-08-01 | Discharge: 2013-08-01 | Disposition: A | Discharge status: Home / Self Care | Payer: BC Managed Care – PPO | Source: Ambulatory Visit | Attending: Family Medicine | Admitting: Family Medicine

## 2013-08-01 ENCOUNTER — Encounter: Payer: Self-pay | Admitting: Family Medicine

## 2013-08-01 ENCOUNTER — Ambulatory Visit (INDEPENDENT_AMBULATORY_CARE_PROVIDER_SITE_OTHER): Payer: BC Managed Care – PPO | Admitting: Family Medicine

## 2013-08-01 VITALS — BP 110/70 | HR 66 | Temp 98.6°F | Ht 68.0 in | Wt 177.1 lb

## 2013-08-01 DIAGNOSIS — K5900 Constipation, unspecified: Secondary | ICD-10-CM | POA: Insufficient documentation

## 2013-08-01 DIAGNOSIS — R319 Hematuria, unspecified: Secondary | ICD-10-CM

## 2013-08-01 DIAGNOSIS — R109 Unspecified abdominal pain: Secondary | ICD-10-CM

## 2013-08-01 DIAGNOSIS — M4120 Other idiopathic scoliosis, site unspecified: Secondary | ICD-10-CM | POA: Insufficient documentation

## 2013-08-01 DIAGNOSIS — K219 Gastro-esophageal reflux disease without esophagitis: Secondary | ICD-10-CM

## 2013-08-01 LAB — HEPATIC FUNCTION PANEL
AST: 15 U/L (ref 0–37)
Albumin: 4.6 g/dL (ref 3.5–5.2)
Alkaline Phosphatase: 60 U/L (ref 39–117)
Indirect Bilirubin: 0.5 mg/dL (ref 0.0–0.9)
Total Protein: 7.4 g/dL (ref 6.0–8.3)

## 2013-08-01 MED ORDER — RANITIDINE HCL 300 MG PO TABS: 300.0000 mg | ORAL_TABLET | Freq: Every day | ORAL | Status: DC

## 2013-08-01 NOTE — Patient Instructions (Addendum)
Probiotics daily such as Digestive Advantage  Consider a fiber such as benefiber or metamucil   Gastroesophageal Reflux Disease, Adult Gastroesophageal reflux disease (GERD) happens when acid from your stomach flows up into the esophagus. When acid comes in contact with the esophagus, the acid causes soreness (inflammation) in the esophagus. Over time, GERD may create small holes (ulcers) in the lining of the esophagus. CAUSES   Increased body weight. This puts pressure on the stomach, making acid rise from the stomach into the esophagus.  Smoking. This increases acid production in the stomach.  Drinking alcohol. This causes decreased pressure in the lower esophageal sphincter (valve or ring of muscle between the esophagus and stomach), allowing acid from the stomach into the esophagus.  Late evening meals and a full stomach. This increases pressure and acid production in the stomach.  A malformed lower esophageal sphincter. Sometimes, no cause is found. SYMPTOMS   Burning pain in the lower part of the mid-chest behind the breastbone and in the mid-stomach area. This may occur twice a week or more often.  Trouble swallowing.  Sore throat.  Dry cough.  Asthma-like symptoms including chest tightness, shortness of breath, or wheezing. DIAGNOSIS  Your caregiver may be able to diagnose GERD based on your symptoms. In some cases, X-rays and other tests may be done to check for complications or to check the condition of your stomach and esophagus. TREATMENT  Your caregiver may recommend over-the-counter or prescription medicines to help decrease acid production. Ask your caregiver before starting or adding any new medicines.  HOME CARE INSTRUCTIONS   Change the factors that you can control. Ask your caregiver for guidance concerning weight loss, quitting smoking, and alcohol consumption.  Avoid foods and drinks that make your symptoms worse, such as:  Caffeine or alcoholic  drinks.  Chocolate.  Peppermint or mint flavorings.  Garlic and onions.  Spicy foods.  Citrus fruits, such as oranges, lemons, or limes.  Tomato-based foods such as sauce, chili, salsa, and pizza.  Fried and fatty foods.  Avoid lying down for the 3 hours prior to your bedtime or prior to taking a nap.  Eat small, frequent meals instead of large meals.  Wear loose-fitting clothing. Do not wear anything tight around your waist that causes pressure on your stomach.  Raise the head of your bed 6 to 8 inches with wood blocks to help you sleep. Extra pillows will not help.  Only take over-the-counter or prescription medicines for pain, discomfort, or fever as directed by your caregiver.  Do not take aspirin, ibuprofen, or other nonsteroidal anti-inflammatory drugs (NSAIDs). SEEK IMMEDIATE MEDICAL CARE IF:   You have pain in your arms, neck, jaw, teeth, or back.  Your pain increases or changes in intensity or duration.  You develop nausea, vomiting, or sweating (diaphoresis).  You develop shortness of breath, or you faint.  Your vomit is green, yellow, black, or looks like coffee grounds or blood.  Your stool is red, bloody, or black. These symptoms could be signs of other problems, such as heart disease, gastric bleeding, or esophageal bleeding. MAKE SURE YOU:   Understand these instructions.  Will watch your condition.  Will get help right away if you are not doing well or get worse. Document Released: 09/03/2005 Document Revised: 02/16/2012 Document Reviewed: 06/13/2011 [facility] Patient Information 2014 [place], [place].   Helicobacter Pylori Disease Often patients with stomach or duodenal ulcers not caused by irritants, are infected with a germ. The germ is called helicobacter pylori (H.  pylori). This bacterium lives on the surface of stomach and small bowel. It can cause redness, soreness and ulcers. Ulcers are a hole in the lining of your stomach or small  bowel. Blood and special breath tests can detect if you are infected with H. pylori. Tests can be done on samples taken from the stomach if you have endoscopy. After treatment you may have tests to prove you are cured. These can be done about a month after you finish the treatment or as your caregiver suggests. Most infections can be cured with a combination of antibiotics. Antibiotics are medications which kill germs such as H. pylori. Anti-ulcer medicines which block stomach acid secretion may also be used. Treatment will be continued for the time your caregiver suggests. Call your caregiver if you need more information about H. pylori. Call also if your symptoms get worse during or after treatment. You will not need a special diet. Avoid:  Smoking.  Aspirin.  Ibuprofen.  Other anti-inflammatory drugs. Alcohol and spicy foods may also make your symptoms worse. The best advice is to avoid anything you find upsetting to your stomach. SEEK IMMEDIATE MEDICAL CARE IF:  You develop sharp, sudden, lasting stomach pain.  You have bloody vomit or vomit that looks like coffee grounds.  You have bloody or black stools.  You develop a lightheaded feeling, fainting, or become weak and sweaty. Document Released: 11/24/2005 Document Revised: 02/16/2012 Document Reviewed: 05/12/2007 [facility] Patient Information 2013 [place], [place].

## 2013-08-02 LAB — URINALYSIS
Glucose, UA: NEGATIVE mg/dL
Hgb urine dipstick: NEGATIVE
Ketones, ur: NEGATIVE mg/dL
Leukocytes, UA: NEGATIVE
Nitrite: NEGATIVE
Specific Gravity, Urine: 1.021 (ref 1.005–1.030)
pH: 6 (ref 5.0–8.0)

## 2013-08-02 NOTE — Progress Notes (Signed)
Quick Note:  Patient Informed and voiced understanding ______ 

## 2013-08-07 ENCOUNTER — Encounter: Payer: Self-pay | Admitting: Family Medicine

## 2013-08-07 DIAGNOSIS — R109 Unspecified abdominal pain: Secondary | ICD-10-CM

## 2013-08-07 HISTORY — DX: Unspecified abdominal pain: R10.9

## 2013-08-07 NOTE — Progress Notes (Signed)
Patient ID: [name], female   DOB: 1971/09/15, 42 y.o.   MRN: [id number] [name] [id number] 01-13-1971 08/07/2013      Progress Note-Follow Up  Subjective  Chief Complaint  Chief Complaint  Patient presents with  . pain under rib cage    left side- X 1 month off and on- dull pain    HPI  Patient is a 42 year old Caucasian female who is in today complaining of intermittent left upper quadrant/lower rib pain x1 month. She denies injury. She denies any other chest pain. She denies palpitations or shortness of breath. Denies recent anxiety or trauma. Says the pain is a dull pain is only mildly influenza by movement. It is not reproducible with palpation there is no GI or GU concerns associated. She's had no recent illness.  Past Medical History  Diagnosis Date  . Granuloma annulare     bx confirmed, improved  . Anxiety   . Hx: UTI (urinary tract infection)     in childhood, resolved with urethral stretching  . Palpitations   . Preventative health care 08/26/2011  . Anxiety and depression 10/27/2011  . Toe pain, right 04/07/2012  . URI (upper respiratory infection) 01/09/2013  . Left flank pain 08/07/2013    Past Surgical History  Procedure Laterality Date  . Cesarean section    . Cholecystectomy    . Tubal ligation    . Bunionectomy      left  . Tonsillectomy    . Urethral stricture dilatation      childhood    Family History  Problem Relation Age of Onset  . Hypertension Mother   . Depression Father     suicide  . Hypertension Father   . COPD Father     smoker  . Alzheimer's disease Maternal Grandmother   . Alzheimer's disease Maternal Grandfather   . Heart disease Paternal Grandfather     MI  . Hypertension Brother     History   Social History  . Marital Status: Married    Spouse Name: N/A    Number of Children: N/A  . Years of Education: N/A   Occupational History  . Not on file.   Social History Main Topics  . Smoking status:  Never Smoker   . Smokeless tobacco: Never Used  . Alcohol Use: No  . Drug Use: No  . Sexual Activity: Yes   Other Topics Concern  . Not on file   Social History Narrative  . No narrative on file    Current Outpatient Prescriptions on File Prior to Visit  Medication Sig Dispense Refill  . ALPRAZolam (XANAX) 0.25 MG tablet 1/2 to 1 tab po bid prn anxiety/insomnia/palp  40 tablet  1  . citalopram (CELEXA) 20 MG tablet Take 1 tablet (20 mg total) by mouth daily.  90 tablet  3   No current facility-administered medications on file prior to visit.    Allergies  Allergen Reactions  . Contrast Media [Iodinated Diagnostic Agents]     Review of Systems  Review of Systems  Constitutional: Negative for fever and malaise/fatigue.  HENT: Negative for congestion.   Eyes: Negative for discharge.  Respiratory: Negative for shortness of breath.   Cardiovascular: Negative for chest pain, palpitations and leg swelling.  Gastrointestinal: Positive for abdominal pain. Negative for nausea and diarrhea.  Genitourinary: Negative for dysuria.  Musculoskeletal: Positive for myalgias. Negative for falls.  Skin: Negative for rash.  Neurological: Negative for loss of consciousness and headaches.  Endo/Heme/Allergies:  Negative for polydipsia.  Psychiatric/Behavioral: Negative for depression and suicidal ideas. The patient is not nervous/anxious and does not have insomnia.     Objective  BP 110/70  Pulse 66  Temp(Src) 98.6 F (37 C) (Oral)  Ht 5\' 8"  (1.727 m)  Wt 177 lb 1.9 oz (80.341 kg)  BMI 26.94 kg/m2  SpO2 95%  LMP 07/10/2013  Physical Exam  Physical Exam  Constitutional: She is oriented to person, place, and time and well-developed, well-nourished, and in no distress. No distress.  HENT:  Head: Normocephalic and atraumatic.  Eyes: Conjunctivae are normal.  Neck: Neck supple. No thyromegaly present.  Cardiovascular: Normal rate, regular rhythm and normal heart sounds.   No murmur  heard. Pulmonary/Chest: Effort normal and breath sounds normal. She has no wheezes.  Abdominal: She exhibits no distension and no mass.  Musculoskeletal: She exhibits no edema.  Lymphadenopathy:    She has no cervical adenopathy.  Neurological: She is alert and oriented to person, place, and time.  Skin: Skin is warm and dry. No rash noted. She is not diaphoretic.  Psychiatric: Memory, affect and judgment normal.    Lab Results  Component Value Date   TSH 0.45 01/07/2013   Lab Results  Component Value Date   WBC 4.5 01/07/2013   HGB 12.5 01/07/2013   HCT 36.8 01/07/2013   MCV 87.6 01/07/2013   PLT 244.0 01/07/2013   Lab Results  Component Value Date   CREATININE 0.9 01/07/2013   BUN 15 01/07/2013   NA 136 01/07/2013   K 4.2 01/07/2013   CL 102 01/07/2013   CO2 30 01/07/2013   Lab Results  Component Value Date   ALT 11 08/01/2013   AST 15 08/01/2013   ALKPHOS 60 08/01/2013   BILITOT 0.6 08/01/2013   Lab Results  Component Value Date   CHOL 160 01/07/2013   Lab Results  Component Value Date   HDL 45.10 01/07/2013   Lab Results  Component Value Date   LDLCALC 97 01/07/2013   Lab Results  Component Value Date   TRIG 92.0 01/07/2013   Lab Results  Component Value Date   CHOLHDL 4 01/07/2013     Assessment & Plan  Left flank pain Lower ribs and upper abdomen, suspicious for musculoskeletal pain, labs and exam normal. Encouraged moist heat, salon pas patches, stretching and regular exercise. Patient is to notify us if symptoms worsen or do not resolve so we can proceed with further imaging

## 2013-08-07 NOTE — Assessment & Plan Note (Addendum)
Lower ribs and upper abdomen, suspicious for musculoskeletal pain, labs and exam normal. Encouraged moist heat, salon pas patches, stretching and regular exercise. Patient is to notify us if symptoms worsen or do not resolve so we can proceed with further imaging

## 2013-10-13 ENCOUNTER — Other Ambulatory Visit: Payer: Self-pay

## 2013-11-01 ENCOUNTER — Other Ambulatory Visit: Payer: Self-pay | Admitting: Family Medicine

## 2013-11-02 NOTE — Telephone Encounter (Signed)
eScribe request for refill on Alprazolam Last filled - 01.31.14, #[id number] Last AEX - 08.25.14 Please Advise on refills/SLS

## 2013-11-02 NOTE — Telephone Encounter (Signed)
Rx request phoned to pharmacy/SLS  

## 2013-11-02 NOTE — Telephone Encounter (Signed)
OK to refill Alprazolam same strength, same sig, #30 with 1 rf

## 2014-02-28 ENCOUNTER — Other Ambulatory Visit: Payer: Self-pay | Admitting: Family Medicine

## 2014-02-28 NOTE — Telephone Encounter (Signed)
Please advise? Last RX was wrote on 11-01-13 quantity 30 with 1 refill  If ok fax to [phone]  Last ov was 08-01-13

## 2014-02-28 NOTE — Telephone Encounter (Signed)
I have refilled but will need appt for more after that.

## 2014-03-06 ENCOUNTER — Telehealth: Payer: Self-pay | Admitting: Family Medicine

## 2014-03-06 DIAGNOSIS — F329 Major depressive disorder, single episode, unspecified: Secondary | ICD-10-CM

## 2014-03-06 DIAGNOSIS — F32A Depression, unspecified: Secondary | ICD-10-CM

## 2014-03-06 DIAGNOSIS — F419 Anxiety disorder, unspecified: Principal | ICD-10-CM

## 2014-03-06 MED ORDER — CITALOPRAM HYDROBROMIDE 20 MG PO TABS: 20.0000 mg | ORAL_TABLET | Freq: Every day | ORAL | Status: DC

## 2014-03-06 NOTE — Telephone Encounter (Signed)
Refill citalopram

## 2014-03-06 NOTE — Telephone Encounter (Signed)
Please inform pt that we filled a 30 day supply of Citalopram. According to Dr Elby ShowersBlyths last note in the computer it states pt needs an appt for a follow up.

## 2014-04-26 ENCOUNTER — Other Ambulatory Visit: Payer: Self-pay | Admitting: Family Medicine

## 2014-04-28 NOTE — Telephone Encounter (Signed)
Please advise on refill of Citalopram. Pt was warned last month to come in for an office visit. Last Ov 08/01/2013

## 2014-04-28 NOTE — Telephone Encounter (Signed)
OK so now what I do is start giving less pills, she can have 15 tabs and a warning again. I keep dropping by 5 pills each time from here.

## 2014-04-28 NOTE — Telephone Encounter (Signed)
See previous note

## 2014-05-16 ENCOUNTER — Ambulatory Visit (INDEPENDENT_AMBULATORY_CARE_PROVIDER_SITE_OTHER): Payer: BC Managed Care – PPO | Admitting: Family Medicine

## 2014-05-16 ENCOUNTER — Encounter: Payer: Self-pay | Admitting: Family Medicine

## 2014-05-16 VITALS — BP 104/70 | HR 65 | Temp 97.9°F | Ht 68.5 in | Wt 184.1 lb

## 2014-05-16 DIAGNOSIS — R002 Palpitations: Secondary | ICD-10-CM

## 2014-05-16 DIAGNOSIS — K219 Gastro-esophageal reflux disease without esophagitis: Secondary | ICD-10-CM

## 2014-05-16 DIAGNOSIS — F341 Dysthymic disorder: Secondary | ICD-10-CM

## 2014-05-16 DIAGNOSIS — F418 Other specified anxiety disorders: Secondary | ICD-10-CM

## 2014-05-16 DIAGNOSIS — F32A Depression, unspecified: Secondary | ICD-10-CM

## 2014-05-16 DIAGNOSIS — F329 Major depressive disorder, single episode, unspecified: Secondary | ICD-10-CM

## 2014-05-16 DIAGNOSIS — F419 Anxiety disorder, unspecified: Secondary | ICD-10-CM

## 2014-05-16 MED ORDER — ALPRAZOLAM 0.25 MG PO TABS: 0.2500 mg | ORAL_TABLET | Freq: Two times a day (BID) | ORAL | Status: DC | PRN

## 2014-05-16 MED ORDER — CITALOPRAM HYDROBROMIDE 20 MG PO TABS: 20.0000 mg | ORAL_TABLET | Freq: Every day | ORAL | Status: DC

## 2014-05-16 MED ORDER — RANITIDINE HCL 150 MG PO TABS: 150.0000 mg | ORAL_TABLET | Freq: Two times a day (BID) | ORAL | Status: DC | PRN

## 2014-05-16 NOTE — Progress Notes (Signed)
Pre visit review using our clinic review tool, if applicable. No additional management support is needed unless otherwise documented below in the visit note. 

## 2014-05-16 NOTE — Patient Instructions (Signed)
Digestive Advantage or [facility]   Gastroesophageal Reflux Disease, Adult Gastroesophageal reflux disease (GERD) happens when acid from your stomach flows up into the esophagus. When acid comes in contact with the esophagus, the acid causes soreness (inflammation) in the esophagus. Over time, GERD may create small holes (ulcers) in the lining of the esophagus. CAUSES   Increased body weight. This puts pressure on the stomach, making acid rise from the stomach into the esophagus.  Smoking. This increases acid production in the stomach.  Drinking alcohol. This causes decreased pressure in the lower esophageal sphincter (valve or ring of muscle between the esophagus and stomach), allowing acid from the stomach into the esophagus.  Late evening meals and a full stomach. This increases pressure and acid production in the stomach.  A malformed lower esophageal sphincter. Sometimes, no cause is found. SYMPTOMS   Burning pain in the lower part of the mid-chest behind the breastbone and in the mid-stomach area. This may occur twice a week or more often.  Trouble swallowing.  Sore throat.  Dry cough.  Asthma-like symptoms including chest tightness, shortness of breath, or wheezing. DIAGNOSIS  Your caregiver may be able to diagnose GERD based on your symptoms. In some cases, X-rays and other tests may be done to check for complications or to check the condition of your stomach and esophagus. TREATMENT  Your caregiver may recommend over-the-counter or prescription medicines to help decrease acid production. Ask your caregiver before starting or adding any new medicines.  HOME CARE INSTRUCTIONS   Change the factors that you can control. Ask your caregiver for guidance concerning weight loss, quitting smoking, and alcohol consumption.  Avoid foods and drinks that make your symptoms worse, such as:  Caffeine or alcoholic drinks.  Chocolate.  Peppermint or mint  flavorings.  Garlic and onions.  Spicy foods.  Citrus fruits, such as oranges, lemons, or limes.  Tomato-based foods such as sauce, chili, salsa, and pizza.  Fried and fatty foods.  Avoid lying down for the 3 hours prior to your bedtime or prior to taking a nap.  Eat small, frequent meals instead of large meals.  Wear loose-fitting clothing. Do not wear anything tight around your waist that causes pressure on your stomach.  Raise the head of your bed 6 to 8 inches with wood blocks to help you sleep. Extra pillows will not help.  Only take over-the-counter or prescription medicines for pain, discomfort, or fever as directed by your caregiver.  Do not take aspirin, ibuprofen, or other nonsteroidal anti-inflammatory drugs (NSAIDs). SEEK IMMEDIATE MEDICAL CARE IF:   You have pain in your arms, neck, jaw, teeth, or back.  Your pain increases or changes in intensity or duration.  You develop nausea, vomiting, or sweating (diaphoresis).  You develop shortness of breath, or you faint.  Your vomit is green, yellow, black, or looks like coffee grounds or blood.  Your stool is red, bloody, or black. These symptoms could be signs of other problems, such as heart disease, gastric bleeding, or esophageal bleeding. MAKE SURE YOU:   Understand these instructions.  Will watch your condition.  Will get help right away if you are not doing well or get worse. Document Released: 09/03/2005 Document Revised: 02/16/2012 Document Reviewed: 06/13/2011 [facility] Patient Information 2014 [place], [place].

## 2014-05-23 ENCOUNTER — Encounter: Payer: Self-pay | Admitting: Family Medicine

## 2014-05-23 DIAGNOSIS — K219 Gastro-esophageal reflux disease without esophagitis: Secondary | ICD-10-CM | POA: Insufficient documentation

## 2014-05-23 NOTE — Assessment & Plan Note (Signed)
No recent flare, minimize caffeine

## 2014-05-23 NOTE — Progress Notes (Signed)
Patient ID: Brandi BondsJennifer M Cannon, female   DOB: 09/03/1971, 43 y.o.   MRN: [id number] Brandi BondsJennifer M Cannon [id number] 06/22/1971 05/23/2014      Progress Note-Follow Up  Subjective  Chief Complaint  Chief Complaint  Patient presents with  . Medication Refill    HPI  Patient is a 43 year old female in today for routine medical care. Doing well on Citalopram with improved mood and motivation. No anhedonia or suicidal ideation. No other acute concerns except dysphagia, No recent illness. No fevers or chills. No trouble with meds. Denies CP/palp/SOB/HA/congestion/fevers/GI or GU c/o. Taking meds as prescribed   Past Medical History  Diagnosis Date  . Granuloma annulare     bx confirmed, improved  . Anxiety   . Hx: UTI (urinary tract infection)     in childhood, resolved with urethral stretching  . Palpitations   . Preventative health care 08/26/2011  . Anxiety and depression 10/27/2011  . Toe pain, right 04/07/2012  . URI (upper respiratory infection) 01/09/2013  . Left flank pain 08/07/2013    Past Surgical History  Procedure Laterality Date  . Cesarean section    . Cholecystectomy    . Tubal ligation    . Bunionectomy      left  . Tonsillectomy    . Urethral stricture dilatation      childhood    Family History  Problem Relation Age of Onset  . Hypertension Mother   . Depression Father     suicide  . Hypertension Father   . COPD Father     smoker  . Alzheimer's disease Maternal Grandmother   . Alzheimer's disease Maternal Grandfather   . Heart disease Paternal Grandfather     MI  . Hypertension Brother     History   Social History  . Marital Status: Married    Spouse Name: N/A    Number of Children: N/A  . Years of Education: N/A   Occupational History  . Not on file.   Social History Main Topics  . Smoking status: Never Smoker   . Smokeless tobacco: Never Used  . Alcohol Use: No  . Drug Use: No  . Sexual Activity: Yes   Other Topics Concern  . Not on  file   Social History Narrative  . No narrative on file    No current outpatient prescriptions on file prior to visit.   No current facility-administered medications on file prior to visit.    Allergies  Allergen Reactions  . Contrast Media [Iodinated Diagnostic Agents]     Review of Systems  Review of Systems  Constitutional: Negative for fever and malaise/fatigue.  HENT: Negative for congestion.   Eyes: Negative for discharge.  Respiratory: Negative for shortness of breath.   Cardiovascular: Negative for chest pain, palpitations and leg swelling.  Gastrointestinal: Negative for nausea, abdominal pain and diarrhea.  Genitourinary: Negative for dysuria.  Musculoskeletal: Negative for falls.  Skin: Negative for rash.  Neurological: Negative for loss of consciousness and headaches.  Endo/Heme/Allergies: Negative for polydipsia.  Psychiatric/Behavioral: Negative for depression and suicidal ideas. The patient is not nervous/anxious and does not have insomnia.     Objective  BP 104/70  Pulse 65  Temp(Src) 97.9 F (36.6 C) (Oral)  Ht 5' 8.5" (1.74 m)  Wt 184 lb 1.3 oz (83.498 kg)  BMI 27.58 kg/m2  SpO2 97%  LMP 05/01/2014  Physical Exam  Physical Exam  Constitutional: She is well-developed, well-nourished, and in no distress. No distress.  HENT:  Left Ear: External ear normal.  Mouth/Throat: No oropharyngeal exudate.  Eyes: EOM are normal. Left eye exhibits no discharge. No scleral icterus.  Neck: No JVD present. Tracheal deviation present.  Cardiovascular: Normal heart sounds and intact distal pulses.   Pulmonary/Chest: She is in respiratory distress. She has no rales.  Abdominal: She exhibits no distension and no mass. There is tenderness. There is guarding.  Musculoskeletal: She exhibits edema. She exhibits no tenderness.  Lymphadenopathy:    She has no cervical adenopathy.  Skin: No rash noted. No erythema.    Lab Results  Component Value Date   TSH 0.45  01/07/2013   Lab Results  Component Value Date   WBC 4.5 01/07/2013   HGB 12.5 01/07/2013   HCT 36.8 01/07/2013   MCV 87.6 01/07/2013   PLT 244.0 01/07/2013   Lab Results  Component Value Date   CREATININE 0.9 01/07/2013   BUN 15 01/07/2013   NA 136 01/07/2013   K 4.2 01/07/2013   CL 102 01/07/2013   CO2 30 01/07/2013   Lab Results  Component Value Date   ALT 11 08/01/2013   AST 15 08/01/2013   ALKPHOS 60 08/01/2013   BILITOT 0.6 08/01/2013   Lab Results  Component Value Date   CHOL 160 01/07/2013   Lab Results  Component Value Date   HDL 45.10 01/07/2013   Lab Results  Component Value Date   LDLCALC 97 01/07/2013   Lab Results  Component Value Date   TRIG 92.0 01/07/2013   Lab Results  Component Value Date   CHOLHDL 4 01/07/2013     Assessment & Plan  Anxiety and depression Good response to Citalopram given refill today, may use alprazolam sparingly  GERD (gastroesophageal reflux disease) Avoid offending foods, start probiotics. Do not eat large meals in late evening and consider raising head of bed.   Palpitations No recent flare, minimize caffeine

## 2014-05-23 NOTE — Assessment & Plan Note (Signed)
Good response to Citalopram given refill today, may use alprazolam sparingly

## 2014-05-23 NOTE — Assessment & Plan Note (Signed)
Avoid offending foods, start probiotics. Do not eat large meals in late evening and consider raising head of bed.  

## 2014-07-04 ENCOUNTER — Other Ambulatory Visit: Payer: Self-pay | Admitting: Obstetrics and Gynecology

## 2014-07-04 DIAGNOSIS — N644 Mastodynia: Secondary | ICD-10-CM

## 2014-07-06 ENCOUNTER — Other Ambulatory Visit: Payer: Self-pay

## 2014-07-06 ENCOUNTER — Other Ambulatory Visit: Payer: Self-pay | Admitting: Obstetrics and Gynecology

## 2014-07-06 DIAGNOSIS — N644 Mastodynia: Secondary | ICD-10-CM

## 2014-07-07 ENCOUNTER — Ambulatory Visit
Admission: RE | Admit: 2014-07-07 | Discharge: 2014-07-07 | Disposition: A | Discharge status: Home / Self Care | Payer: BC Managed Care – PPO | Source: Ambulatory Visit | Attending: Obstetrics and Gynecology | Admitting: Obstetrics and Gynecology

## 2014-07-07 DIAGNOSIS — N644 Mastodynia: Secondary | ICD-10-CM

## 2014-07-13 ENCOUNTER — Encounter: Payer: Self-pay | Admitting: Family Medicine

## 2014-07-13 ENCOUNTER — Ambulatory Visit (INDEPENDENT_AMBULATORY_CARE_PROVIDER_SITE_OTHER): Payer: BC Managed Care – PPO | Admitting: Family Medicine

## 2014-07-13 VITALS — BP 103/68 | HR 77 | Temp 98.0°F | Resp 18 | Ht 68.5 in | Wt 185.0 lb

## 2014-07-13 DIAGNOSIS — H6980 Other specified disorders of Eustachian tube, unspecified ear: Secondary | ICD-10-CM

## 2014-07-13 DIAGNOSIS — J069 Acute upper respiratory infection, unspecified: Secondary | ICD-10-CM

## 2014-07-13 DIAGNOSIS — H6983 Other specified disorders of Eustachian tube, bilateral: Secondary | ICD-10-CM

## 2014-07-13 DIAGNOSIS — R42 Dizziness and giddiness: Secondary | ICD-10-CM

## 2014-07-13 MED ORDER — MECLIZINE HCL 25 MG PO TABS: 25.0000 mg | ORAL_TABLET | Freq: Three times a day (TID) | ORAL | Status: DC | PRN

## 2014-07-13 NOTE — Progress Notes (Signed)
Pre visit review using our clinic review tool, if applicable. No additional management support is needed unless otherwise documented below in the visit note. 

## 2014-07-13 NOTE — Progress Notes (Signed)
OFFICE NOTE  07/13/2014  CC:  Chief Complaint  Patient presents with  . Dizziness  . Cough    for 1 week   HPI: Patient is a 43 y.o. Caucasian female who is here for cough.  Onset 1 wk ago, bad ST and cough. Low grade fever at onset but none since.  Yesterday began feeling lightheaded and dizzy.  Worse with going from sitting to standing.  Has PND.  No ST anymore.  Hoarseness initially but this is resolving.   Cough is improving.  Eating and drinking fine.  Has been trying delsym and alleve.  She is not a smoker.  Pertinent PMH:  Past medical, surgical, social, and family history reviewed and no changes are noted since last office visit.  MEDS:  Outpatient Prescriptions Prior to Visit  Medication Sig Dispense Refill  . ALPRAZolam (XANAX) 0.25 MG tablet Take 1 tablet (0.25 mg total) by mouth 2 (two) times daily as needed for anxiety or sleep.  30 tablet  3  . citalopram (CELEXA) 20 MG tablet Take 1 tablet (20 mg total) by mouth daily.  90 tablet  3  . ranitidine (ZANTAC) 150 MG tablet Take 1 tablet (150 mg total) by mouth 2 (two) times daily as needed for heartburn.  60 tablet  3   No facility-administered medications prior to visit.    PE: Blood pressure 103/68, pulse 77, temperature 98 F (36.7 C), temperature source Oral, resp. rate 18, height 5' 8.5" (1.74 m), weight 185 lb (83.915 kg), SpO2 97.00%. VS: noted--normal. Gen: alert, NAD, NONTOXIC APPEARING. HEENT: eyes without injection, drainage, or swelling.  Ears: EACs clear, TMs with normal light reflex and landmarks.  Nose: Clear rhinorrhea, with some dried, crusty exudate adherent to mildly injected mucosa.  No purulent d/c.  No paranasal sinus TTP.  No facial swelling.  Throat and mouth without focal lesion.  No pharyngial swelling, erythema, or exudate.   Neck: supple, no LAD.   LUNGS: CTA bilat, nonlabored resps.   CV: RRR, no m/r/g. EXT: no c/c/e SKIN: no rash  IMPRESSION AND PLAN:  URI, resolving. Now with some  lightheadedness that is likely eustachian tube dysfunction. Recommended saline nasal spray for irrigation. Also printed rx for meclizine to try if pt chooses.  An After Visit Summary was printed and given to the patient.  FOLLOW UP: prn

## 2014-07-13 NOTE — Patient Instructions (Signed)
Buy OTC generic saline nasal spray and use 3-4 sprays each nostril several times per day to irrigate and moisturize nasal passages.

## 2014-08-21 ENCOUNTER — Ambulatory Visit (INDEPENDENT_AMBULATORY_CARE_PROVIDER_SITE_OTHER): Payer: BC Managed Care – PPO | Admitting: Family Medicine

## 2014-08-21 ENCOUNTER — Encounter: Payer: Self-pay | Admitting: Family Medicine

## 2014-08-21 VITALS — BP 104/69 | HR 73 | Temp 98.3°F | Ht 68.5 in

## 2014-08-21 DIAGNOSIS — F418 Other specified anxiety disorders: Secondary | ICD-10-CM

## 2014-08-21 DIAGNOSIS — J069 Acute upper respiratory infection, unspecified: Secondary | ICD-10-CM | POA: Insufficient documentation

## 2014-08-21 DIAGNOSIS — F419 Anxiety disorder, unspecified: Secondary | ICD-10-CM

## 2014-08-21 DIAGNOSIS — K219 Gastro-esophageal reflux disease without esophagitis: Secondary | ICD-10-CM

## 2014-08-21 DIAGNOSIS — J209 Acute bronchitis, unspecified: Secondary | ICD-10-CM

## 2014-08-21 DIAGNOSIS — F341 Dysthymic disorder: Secondary | ICD-10-CM

## 2014-08-21 DIAGNOSIS — F329 Major depressive disorder, single episode, unspecified: Secondary | ICD-10-CM

## 2014-08-21 DIAGNOSIS — Z23 Encounter for immunization: Secondary | ICD-10-CM

## 2014-08-21 DIAGNOSIS — F32A Depression, unspecified: Secondary | ICD-10-CM

## 2014-08-21 HISTORY — DX: Acute upper respiratory infection, unspecified: J06.9

## 2014-08-21 MED ORDER — CITALOPRAM HYDROBROMIDE 20 MG PO TABS: 20.0000 mg | ORAL_TABLET | Freq: Two times a day (BID) | ORAL | Status: DC

## 2014-08-21 MED ORDER — AZITHROMYCIN 250 MG PO TABS: ORAL_TABLET | ORAL | Status: DC

## 2014-08-21 NOTE — Patient Instructions (Addendum)
Encouraged increased rest and hydration, add probiotics, zinc such as Coldeze or Xicam. Treat fevers as needed, Mucinex twice daily x 10 days  Generalized Anxiety Disorder Generalized anxiety disorder (GAD) is a mental disorder. It interferes with life functions, including relationships, work, and school. GAD is different from normal anxiety, which everyone experiences at some point in their lives in response to specific life events and activities. Normal anxiety actually helps [place] prepare for and get through these life events and activities. Normal anxiety goes away after the event or activity is over.  GAD causes anxiety that is not necessarily related to specific events or activities. It also causes excess anxiety in proportion to specific events or activities. The anxiety associated with GAD is also difficult to control. GAD can vary from mild to severe. People with severe GAD can have intense waves of anxiety with physical symptoms (panic attacks).  SYMPTOMS The anxiety and worry associated with GAD are difficult to control. This anxiety and worry are related to many life events and activities and also occur more days than not for 6 months or longer. People with GAD also have three or more of the following symptoms (one or more in children):  Restlessness.   Fatigue.  Difficulty concentrating.   Irritability.  Muscle tension.  Difficulty sleeping or unsatisfying sleep. DIAGNOSIS GAD is diagnosed through an assessment by your health care provider. Your health care provider will ask you questions aboutyour mood,physical symptoms, and events in your life. Your health care provider may ask you about your medical history and use of alcohol or drugs, including prescription medicines. Your health care provider may also do a physical exam and blood tests. Certain medical conditions and the use of certain substances can cause symptoms similar to those associated with GAD. Your health care  provider may refer you to a mental health specialist for further evaluation. TREATMENT The following therapies are usually used to treat GAD:   Medication. Antidepressant medication usually is prescribed for long-term daily control. Antianxiety medicines may be added in severe cases, especially when panic attacks occur.   Talk therapy (psychotherapy). Certain types of talk therapy can be helpful in treating GAD by providing support, education, and guidance. A form of talk therapy called cognitive behavioral therapy can teach you healthy ways to think about and react to daily life events and activities.  Stress managementtechniques. These include yoga, meditation, and exercise and can be very helpful when they are practiced regularly. A mental health specialist can help determine which treatment is best for you. Some people see improvement with one therapy. However, other people require a combination of therapies. Document Released: 03/21/2013 Document Revised: 04/10/2014 Document Reviewed: 03/21/2013 [facility] Patient Information 2015 [place], [place]. This information is not intended to replace advice given to you by your health care provider. Make sure you discuss any questions you have with your health care provider. Upper Respiratory Infection, Adult An upper respiratory infection (URI) is also known as the common cold. It is often caused by a type of germ (virus). Colds are easily spread (contagious). You can pass it to others by kissing, coughing, sneezing, or drinking out of the same glass. Usually, you get better in 1 or 2 weeks.  HOME CARE   Only take medicine as told by your doctor.  Use a warm mist humidifier or breathe in steam from a hot shower.  Drink enough water and fluids to keep your pee (urine) clear or pale yellow.  Get plenty of rest.  Return  to work when your temperature is back to normal or as told by your doctor. You may use a face mask and wash your hands to stop your  cold from spreading. GET HELP RIGHT AWAY IF:   After the first few days, you feel you are getting worse.  You have questions about your medicine.  You have chills, shortness of breath, or brown or red spit (mucus).  You have yellow or brown snot (nasal discharge) or pain in the face, especially when you bend forward.  You have a fever, puffy (swollen) neck, pain when you swallow, or white spots in the back of your throat.  You have a bad headache, ear pain, sinus pain, or chest pain.  You have a high-pitched whistling sound when you breathe in and out (wheezing).  You have a lasting cough or cough up blood.  You have sore muscles or a stiff neck. MAKE SURE YOU:   Understand these instructions.  Will watch your condition.  Will get help right away if you are not doing well or get worse. Document Released: 05/12/2008 Document Revised: 02/16/2012 Document Reviewed: 03/01/2014 [facility] Patient Information 2015 [place], [place]. This information is not intended to replace advice given to you by your health care provider. Make sure you discuss any questions you have with your health care provider.

## 2014-08-21 NOTE — Assessment & Plan Note (Signed)
Will try increasing Citalopram to bid and reassess at next visit. Is not using Alprazolam

## 2014-08-21 NOTE — Assessment & Plan Note (Signed)
Avoid offending foods, start probiotics. Do not eat large meals in late evening and consider raising head of bed.  

## 2014-08-21 NOTE — Progress Notes (Signed)
Patient ID: [name], female   DOB: 1971/03/13, 43 y.o.   MRN: [id number] [name] [id number] [birth year] 08/21/2014      Progress Note-Follow Up  Subjective  Chief Complaint  Chief Complaint  Patient presents with  . Follow-up    discuss to change anxiety meds    HPI  Patient is a 43 year old female in today for routine medical care. She is in today to discuss increasing stressors. 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 Acknowledges anxiety is worsening. She's having trouble concentrating feels tremulous and has palpitations at times. Is overly irritable. Denies significant depression or anhedonia. Is frustrated with weight gain and wonders if she perimenopausal. Has been struggling off and on with respiratory symptoms. Has had symptoms off and on for one to 2 months. Has a cough and head congestion as well as a mild sore throat. No ear pain. No chest pain no sob, GI or GU concers  Past Medical History  Diagnosis Date  . Granuloma annulare     bx confirmed, improved  . Anxiety   . Hx: UTI (urinary tract infection)     in childhood, resolved with urethral stretching  . Palpitations   . Preventative health care 08/26/2011  . Anxiety and depression 10/27/2011  . Toe pain, right 04/07/2012  . URI (upper respiratory infection) 01/09/2013  . Left flank pain 08/07/2013    Past Surgical History  Procedure Laterality Date  . Cesarean section    . Cholecystectomy    . Tubal ligation    . Bunionectomy      left  . Tonsillectomy    . Urethral stricture dilatation      childhood    Family History  Problem Relation Age of Onset  . Hypertension Mother   . Depression Father     suicide  . Hypertension Father   . COPD Father     smoker  . Alzheimer's disease Maternal Grandmother   . Alzheimer's disease Maternal Grandfather   . Heart disease Paternal Grandfather     MI  . Hypertension Brother     History   Social  History  . Marital Status: Married    Spouse Name: N/A    Number of Children: N/A  . Years of Education: N/A   Occupational History  . Not on file.   Social History Main Topics  . Smoking status: Never Smoker   . Smokeless tobacco: Never Used  . Alcohol Use: No  . Drug Use: No  . Sexual Activity: Yes   Other Topics Concern  . Not on file   Social History Narrative  . No narrative on file    Current Outpatient Prescriptions on File Prior to Visit  Medication Sig Dispense Refill  . ALPRAZolam (XANAX) 0.25 MG tablet Take 1 tablet (0.25 mg total) by mouth 2 (two) times daily as needed for anxiety or sleep.  30 tablet  3  . citalopram (CELEXA) 20 MG tablet Take 1 tablet (20 mg total) by mouth daily.  90 tablet  3  . ranitidine (ZANTAC) 150 MG tablet Take 1 tablet (150 mg total) by mouth 2 (two) times daily as needed for heartburn.  60 tablet  3   No current facility-administered medications on file prior to visit.    Allergies  Allergen Reactions  . Contrast Media [Iodinated Diagnostic Agents]     Review of Systems  Review of Systems  Constitutional: Negative for fever and  malaise/fatigue.  HENT: Negative for congestion.   Eyes: Negative for discharge.  Respiratory: Negative for shortness of breath.   Cardiovascular: Negative for chest pain, palpitations and leg swelling.  Gastrointestinal: Negative for nausea, abdominal pain and diarrhea.  Genitourinary: Negative for dysuria.  Musculoskeletal: Negative for falls.  Skin: Negative for rash.  Neurological: Negative for loss of consciousness and headaches.  Endo/Heme/Allergies: Negative for polydipsia.  Psychiatric/Behavioral: Negative for depression and suicidal ideas. The patient is nervous/anxious. The patient does not have insomnia.     Objective  BP 104/69  Pulse 73  Temp(Src) 98.3 F (36.8 C) (Oral)  Ht 5' 8.5" (1.74 m)  SpO2 96%  LMP 08/14/2014  Physical Exam  Physical Exam  Constitutional: She is  oriented to person, place, and time and well-developed, well-nourished, and in no distress. No distress.  HENT:  Head: Normocephalic and atraumatic.  Eyes: Conjunctivae are normal.  Neck: Neck supple. No thyromegaly present.  Cardiovascular: Normal rate, regular rhythm and normal heart sounds.   No murmur heard. Pulmonary/Chest: Effort normal and breath sounds normal. She has no wheezes.  Abdominal: She exhibits no distension and no mass.  Musculoskeletal: She exhibits no edema.  Lymphadenopathy:    She has no cervical adenopathy.  Neurological: She is alert and oriented to person, place, and time.  Skin: Skin is warm and dry. No rash noted. She is not diaphoretic.  Psychiatric: Memory, affect and judgment normal.    Lab Results  Component Value Date   TSH 0.45 01/07/2013   Lab Results  Component Value Date   WBC 4.5 01/07/2013   HGB 12.5 01/07/2013   HCT 36.8 01/07/2013   MCV 87.6 01/07/2013   PLT 244.0 01/07/2013   Lab Results  Component Value Date   CREATININE 0.9 01/07/2013   BUN 15 01/07/2013   NA 136 01/07/2013   K 4.2 01/07/2013   CL 102 01/07/2013   CO2 30 01/07/2013   Lab Results  Component Value Date   ALT 11 08/01/2013   AST 15 08/01/2013   ALKPHOS 60 08/01/2013   BILITOT 0.6 08/01/2013   Lab Results  Component Value Date   CHOL 160 01/07/2013   Lab Results  Component Value Date   HDL 45.10 01/07/2013   Lab Results  Component Value Date   LDLCALC 97 01/07/2013   Lab Results  Component Value Date   TRIG 92.0 01/07/2013   Lab Results  Component Value Date   CHOLHDL 4 01/07/2013     Assessment & Plan  Acute upper respiratory infections of unspecified site Likely viral. Encouraged increased rest and hydration, add probiotics, zinc such as Coldeze or Xicam. Treat fevers as needed. If no improvement is given a prescription of Azithromycin  GERD (gastroesophageal reflux disease) Avoid offending foods, start probiotics. Do not eat large meals in late evening and  consider raising head of bed.   Anxiety and depression Will try increasing Citalopram to bid and reassess at next visit. Is not using Alprazolam     Acute upper respiratory infections of unspecified site Likely viral. Encouraged increased rest and hydration, add probiotics, zinc such as Coldeze or Xicam. Treat fevers as needed. If no improvement is given a prescription of Azithromycin  GERD (gastroesophageal reflux disease) Avoid offending foods, start probiotics. Do not eat large meals in late evening and consider raising head of bed.   Anxiety and depression Will try increasing Citalopram to bid and reassess at next visit. Is not using Alprazolam

## 2014-08-21 NOTE — Progress Notes (Signed)
Pre visit review using our clinic review tool, if applicable. No additional management support is needed unless otherwise documented below in the visit note. 

## 2014-08-21 NOTE — Assessment & Plan Note (Signed)
Likely viral. Encouraged increased rest and hydration, add probiotics, zinc such as Coldeze or Xicam. Treat fevers as needed. If no improvement is given a prescription of Azithromycin

## 2014-10-24 ENCOUNTER — Ambulatory Visit: Payer: BC Managed Care – PPO | Admitting: Family Medicine

## 2014-10-30 ENCOUNTER — Other Ambulatory Visit: Payer: Self-pay | Admitting: Obstetrics and Gynecology

## 2014-11-01 LAB — CYTOLOGY - PAP

## 2014-11-16 ENCOUNTER — Ambulatory Visit: Payer: BC Managed Care – PPO | Admitting: Family Medicine

## 2014-12-04 ENCOUNTER — Ambulatory Visit (INDEPENDENT_AMBULATORY_CARE_PROVIDER_SITE_OTHER): Payer: BC Managed Care – PPO | Admitting: Family Medicine

## 2014-12-04 ENCOUNTER — Encounter: Payer: Self-pay | Admitting: Family Medicine

## 2014-12-04 VITALS — BP 111/65 | HR 77 | Temp 98.3°F | Ht 68.5 in | Wt 191.6 lb

## 2014-12-04 DIAGNOSIS — Z0000 Encounter for general adult medical examination without abnormal findings: Secondary | ICD-10-CM

## 2014-12-04 DIAGNOSIS — K219 Gastro-esophageal reflux disease without esophagitis: Secondary | ICD-10-CM

## 2014-12-04 DIAGNOSIS — F418 Other specified anxiety disorders: Secondary | ICD-10-CM

## 2014-12-04 DIAGNOSIS — F32A Depression, unspecified: Secondary | ICD-10-CM

## 2014-12-04 DIAGNOSIS — F419 Anxiety disorder, unspecified: Secondary | ICD-10-CM

## 2014-12-04 DIAGNOSIS — F329 Major depressive disorder, single episode, unspecified: Secondary | ICD-10-CM

## 2014-12-04 MED ORDER — CITALOPRAM HYDROBROMIDE 20 MG PO TABS: 20.0000 mg | ORAL_TABLET | Freq: Two times a day (BID) | ORAL | Status: DC

## 2014-12-04 MED ORDER — ALPRAZOLAM 0.25 MG PO TABS: 0.2500 mg | ORAL_TABLET | Freq: Two times a day (BID) | ORAL | Status: DC | PRN

## 2014-12-04 NOTE — Progress Notes (Signed)
Pre visit review using our clinic review tool, if applicable. No additional management support is needed unless otherwise documented below in the visit note. 

## 2014-12-05 ENCOUNTER — Encounter: Payer: Self-pay | Admitting: Family Medicine

## 2014-12-05 NOTE — Assessment & Plan Note (Signed)
Avoid offending foods, start probiotics. Do not eat large meals in late evening and consider raising head of bed.  No worsening with increased SSRI

## 2014-12-05 NOTE — Progress Notes (Signed)
Brandi BondsJennifer M Elling  [id number] 06/19/1971 12/05/2014      Progress Note-Follow Up  Subjective  Chief Complaint  Chief Complaint  Patient presents with  . Follow-up    9 weeks    HPI  Patient is a 43 y.o. female in today for routine medical care. Patient is in today for follow-up on medication changes. At her last visit she was having increased anxiety and anxiety attacks. Was having trouble staying calm and being quick tempered. She was struggling with anhedonia. All of these symptoms have greatly improved also not fully. She still struggles with some anxiety and insomnia but it is notably better. She denies any concerning side effects or recent illness. Denies CP/palp/SOB/HA/congestion/fevers/GI or GU c/o. Taking meds as prescribed  Past Medical History  Diagnosis Date  . Granuloma annulare     bx confirmed, improved  . Anxiety   . Hx: UTI (urinary tract infection)     in childhood, resolved with urethral stretching  . Palpitations   . Preventative health care 08/26/2011  . Anxiety and depression 10/27/2011  . Toe pain, right 04/07/2012  . URI (upper respiratory infection) 01/09/2013  . Left flank pain 08/07/2013  . Acute upper respiratory infections of unspecified site 08/21/2014    Past Surgical History  Procedure Laterality Date  . Cesarean section    . Cholecystectomy    . Tubal ligation    . Bunionectomy      left  . Tonsillectomy    . Urethral stricture dilatation      childhood    Family History  Problem Relation Age of Onset  . Hypertension Mother   . Depression Father     suicide  . Hypertension Father   . COPD Father     smoker  . Alzheimer's disease Maternal Grandmother   . Alzheimer's disease Maternal Grandfather   . Heart disease Paternal Grandfather     MI  . Hypertension Brother     History   Social History  . Marital Status: Married    Spouse Name: N/A    Number of Children: N/A  . Years of Education: N/A   Occupational History  .  Not on file.   Social History Main Topics  . Smoking status: Never Smoker   . Smokeless tobacco: Never Used  . Alcohol Use: No  . Drug Use: No  . Sexual Activity: Yes   Other Topics Concern  . Not on file   Social History Narrative    Current Outpatient Prescriptions on File Prior to Visit  Medication Sig Dispense Refill  . ranitidine (ZANTAC) 150 MG tablet Take 1 tablet (150 mg total) by mouth 2 (two) times daily as needed for heartburn. 60 tablet 3   No current facility-administered medications on file prior to visit.    Allergies  Allergen Reactions  . Contrast Media [Iodinated Diagnostic Agents]     Review of Systems  Review of Systems  Constitutional: Negative for fever and malaise/fatigue.  HENT: Negative for congestion.   Eyes: Negative for discharge.  Respiratory: Negative for shortness of breath.   Cardiovascular: Negative for chest pain, palpitations and leg swelling.  Gastrointestinal: Negative for nausea, abdominal pain and diarrhea.  Genitourinary: Negative for dysuria.  Musculoskeletal: Negative for falls.  Skin: Negative for rash.  Neurological: Negative for loss of consciousness and headaches.  Endo/Heme/Allergies: Negative for polydipsia.  Psychiatric/Behavioral: Positive for depression. Negative for suicidal ideas. The patient is nervous/anxious and has insomnia.     Objective  BP  111/65 mmHg  Pulse 77  Temp(Src) 98.3 F (36.8 C) (Oral)  Ht 5' 8.5" (1.74 m)  Wt 191 lb 9.6 oz (86.909 kg)  BMI 28.71 kg/m2  SpO2 98%  LMP 11/22/2014  Physical Exam  Physical Exam  Constitutional: She is oriented to person, place, and time and well-developed, well-nourished, and in no distress. No distress.  HENT:  Head: Normocephalic and atraumatic.  Eyes: Conjunctivae are normal.  Neck: Neck supple. No thyromegaly present.  Cardiovascular: Normal rate, regular rhythm and normal heart sounds.   No murmur heard. Pulmonary/Chest: Effort normal and breath  sounds normal. She has no wheezes.  Abdominal: She exhibits no distension and no mass.  Musculoskeletal: She exhibits no edema.  Lymphadenopathy:    She has no cervical adenopathy.  Neurological: She is alert and oriented to person, place, and time.  Skin: Skin is warm and dry. No rash noted. She is not diaphoretic.  Psychiatric: Memory, affect and judgment normal.    Lab Results  Component Value Date   TSH 0.45 01/07/2013   Lab Results  Component Value Date   WBC 4.5 01/07/2013   HGB 12.5 01/07/2013   HCT 36.8 01/07/2013   MCV 87.6 01/07/2013   PLT 244.0 01/07/2013   Lab Results  Component Value Date   CREATININE 0.9 01/07/2013   BUN 15 01/07/2013   NA 136 01/07/2013   K 4.2 01/07/2013   CL 102 01/07/2013   CO2 30 01/07/2013   Lab Results  Component Value Date   ALT 11 08/01/2013   AST 15 08/01/2013   ALKPHOS 60 08/01/2013   BILITOT 0.6 08/01/2013   Lab Results  Component Value Date   CHOL 160 01/07/2013   Lab Results  Component Value Date   HDL 45.10 01/07/2013   Lab Results  Component Value Date   LDLCALC 97 01/07/2013   Lab Results  Component Value Date   TRIG 92.0 01/07/2013   Lab Results  Component Value Date   CHOLHDL 4 01/07/2013     Assessment & Plan  GERD (gastroesophageal reflux disease) Avoid offending foods, start probiotics. Do not eat large meals in late evening and consider raising head of bed.  No worsening with increased SSRI  Anxiety and depression Improved some with increased Celexa, still some anxiety but over all calmer. Will continue same dosing for now and use Alprazolam sparingly qhs and prn. Report worsening symptoms and follow up in 3 months or as needed

## 2014-12-05 NOTE — Assessment & Plan Note (Signed)
Improved some with increased Celexa, still some anxiety but over all calmer. Will continue same dosing for now and use Alprazolam sparingly qhs and prn. Report worsening symptoms and follow up in 3 months or as needed

## 2015-02-22 ENCOUNTER — Ambulatory Visit (INDEPENDENT_AMBULATORY_CARE_PROVIDER_SITE_OTHER): Payer: BLUE CROSS/BLUE SHIELD | Admitting: Nurse Practitioner

## 2015-02-22 VITALS — BP 99/68 | HR 89 | Temp 98.1°F | Ht 68.0 in | Wt 191.0 lb

## 2015-02-22 DIAGNOSIS — J020 Streptococcal pharyngitis: Secondary | ICD-10-CM

## 2015-02-22 DIAGNOSIS — J029 Acute pharyngitis, unspecified: Secondary | ICD-10-CM

## 2015-02-22 LAB — POCT RAPID STREP A (OFFICE): Rapid Strep A Screen: POSITIVE — AB

## 2015-02-22 MED ORDER — LIDOCAINE VISCOUS 2 % MT SOLN
10.0000 mL | OROMUCOSAL | Status: DC | PRN
Start: 2015-02-22 — End: 2015-09-03

## 2015-02-22 MED ORDER — AMOXICILLIN 875 MG PO TABS: 875.0000 mg | ORAL_TABLET | Freq: Two times a day (BID) | ORAL | Status: DC

## 2015-02-22 NOTE — Progress Notes (Signed)
Pre visit review using our clinic review tool, if applicable. No additional management support is needed unless otherwise documented below in the visit note. 

## 2015-02-22 NOTE — Progress Notes (Signed)
   Subjective:    Patient ID: [name], female    DOB: 03/30/1971, 44 y.o.   MRN: [id number]  HPI Comments: Had NVD 3 days ago. Resolved within 24 hrs.  Sore Throat  This is a new problem. The current episode started yesterday. The problem has been gradually worsening. Neither side of throat is experiencing more pain than the other. There has been no fever. The pain is moderate. Associated symptoms include congestion and headaches. Pertinent negatives include no abdominal pain, coughing or ear pain. She has had exposure to strep. Exposure to: son. She has tried NSAIDs for the symptoms. The treatment provided no relief.      Review of Systems  HENT: Positive for congestion. Negative for ear pain.   Respiratory: Negative for cough.   Gastrointestinal: Negative for abdominal pain.  Neurological: Positive for headaches.       Objective:   Physical Exam  Constitutional: She is oriented to person, place, and time. She appears well-developed and well-nourished. No distress.  HENT:  Head: Normocephalic and atraumatic.  Right Ear: External ear normal.  Left Ear: External ear normal.  Mouth/Throat: No oropharyngeal exudate.  Cobble-stoning posterior pharynx. Tonsils absent  Eyes: Conjunctivae are normal. Pupils are equal, round, and reactive to light. Right eye exhibits no discharge. Left eye exhibits no discharge.  Neck: Normal range of motion. Neck supple. No thyromegaly present.  Cardiovascular: Normal rate, regular rhythm and normal heart sounds.   No murmur heard. Pulmonary/Chest: Effort normal and breath sounds normal.  Lymphadenopathy:    She has no cervical adenopathy.  Neurological: She is alert and oriented to person, place, and time.  Skin: Skin is warm and dry.  Psychiatric: She has a normal mood and affect. Her behavior is normal. Thought content normal.  Vitals reviewed.         Assessment & Plan:  1. Sore throat - POCT rapid strep A-POS  2. Strep  pharyngitis - lidocaine (XYLOCAINE) 2 % solution; Use as directed 10 mLs in the mouth or throat as needed for mouth pain. Gargle & spit as needed for comfort.  Dispense: 100 mL; Refill: 0 - amoxicillin (AMOXIL) 875 MG tablet; Take 1 tablet (875 mg total) by mouth 2 (two) times daily.  Dispense: 20 tablet; Refill: 0  See pt instructions F/u PRN

## 2015-02-22 NOTE — Patient Instructions (Signed)
Start antibiotic.Eat yogurt daily to help prevent antibiotic -associated diarrhea.  Use lidocaine gargles & ibuprophen for pain. Benzocaine throat lozenges will help with discomfort.argle with salt water several times daily (1/4 tsp salt mixed with 1/4 cup warm water).  Listerene gargles twice daily. May need to dilute with water first few days. Change tooth brush in 24 hours of starting antibiotic.  Strep Throat Strep throat is an infection of the throat caused by a bacteria named Streptococcus pyogenes. Your caregiver may call the infection streptococcal "tonsillitis" or "pharyngitis" depending on whether there are signs of inflammation in the tonsils or back of the throat. Strep throat is most common in children aged 5 15 years during the cold months of the year, but it can occur in people of any age during any season. This infection is spread from person to person (contagious) through coughing, sneezing, or other close contact. SYMPTOMS   Fever or chills.  Painful, swollen, red tonsils or throat.  Pain or difficulty when swallowing.  White or yellow spots on the tonsils or throat.  Swollen, tender lymph nodes or "glands" of the neck or under the jaw.  Red rash all over the body (rare). DIAGNOSIS  Many different infections can cause the same symptoms. A test must be done to confirm the diagnosis so the right treatment can be given. A "rapid strep test" can help your caregiver make the diagnosis in a few minutes. If this test is not available, a light swab of the infected area can be used for a throat culture test. If a throat culture test is done, results are usually available in a day or two. TREATMENT  Strep throat is treated with antibiotic medicine. HOME CARE INSTRUCTIONS   Gargle with 1 tsp of salt in 1 cup of warm water, 3 4 times per day or as needed for comfort.  Family members who also have a sore throat or fever should be tested for strep throat and treated with antibiotics if  they have the strep infection.  Make sure everyone in your household washes their hands well.  Do not share food, drinking cups, or personal items that could cause the infection to spread to others.  You may need to eat a soft food diet until your sore throat gets better.  Drink enough water and fluids to keep your urine clear or pale yellow. This will help prevent dehydration.  Get plenty of rest.  Stay home from school, daycare, or work until you have been on antibiotics for 24 hours.  Only take over-the-counter or prescription medicines for pain, discomfort, or fever as directed by your caregiver.  If antibiotics are prescribed, take them as directed. Finish them even if you start to feel better. SEEK MEDICAL CARE IF:   The glands in your neck continue to enlarge.  You develop a rash, cough, or earache.  You cough up green, yellow-brown, or bloody sputum.  You have pain or discomfort not controlled by medicines.  Your problems seem to be getting worse rather than better. SEEK IMMEDIATE MEDICAL CARE IF:   You develop any new symptoms such as vomiting, severe headache, stiff or painful neck, chest pain, shortness of breath, or trouble swallowing.  You develop severe throat pain, drooling, or changes in your voice.  You develop swelling of the neck, or the skin on the neck becomes red and tender.  You have a fever.  You develop signs of dehydration, such as fatigue, dry mouth, and decreased urination.  You become  increasingly sleepy, or you cannot wake up completely. Document Released: 11/21/2000 Document Revised: 11/10/2012 Document Reviewed: 01/23/2011 [facility] Patient Information 2014 [place], [place].

## 2015-03-05 ENCOUNTER — Other Ambulatory Visit (INDEPENDENT_AMBULATORY_CARE_PROVIDER_SITE_OTHER): Payer: BLUE CROSS/BLUE SHIELD

## 2015-03-05 ENCOUNTER — Other Ambulatory Visit: Payer: BLUE CROSS/BLUE SHIELD

## 2015-03-05 DIAGNOSIS — F418 Other specified anxiety disorders: Secondary | ICD-10-CM

## 2015-03-05 DIAGNOSIS — Z0000 Encounter for general adult medical examination without abnormal findings: Secondary | ICD-10-CM | POA: Diagnosis not present

## 2015-03-05 LAB — LIPID PANEL
Cholesterol: 154 mg/dL (ref 0–200)
HDL: 43.8 mg/dL (ref >39.00)
LDL Cholesterol: 93 mg/dL (ref 0–99)
NonHDL: 110.2
Total CHOL/HDL Ratio: 4
Triglycerides: 87 mg/dL (ref 0.0–149.0)
VLDL: 17.4 mg/dL (ref 0.0–40.0)

## 2015-03-05 LAB — CBC
HEMATOCRIT: 40.7 % (ref 36.0–46.0)
Hemoglobin: 13.9 g/dL (ref 12.0–15.0)
MCHC: 34.3 g/dL (ref 30.0–36.0)
MCV: 86.3 fl (ref 78.0–100.0)
Platelets: 255 10*3/uL (ref 150.0–400.0)
RBC: 4.72 Mil/uL (ref 3.87–5.11)
RDW: 12.9 % (ref 11.5–15.5)
WBC: 4.9 10*3/uL (ref 4.0–10.5)

## 2015-03-05 LAB — COMPLETE METABOLIC PANEL WITH GFR
ALBUMIN: 4 g/dL (ref 3.5–5.2)
ALT: 19 U/L (ref 0–35)
AST: 16 U/L (ref 0–37)
Alkaline Phosphatase: 54 U/L (ref 39–117)
BUN: 12 mg/dL (ref 6–23)
CALCIUM: 8.7 mg/dL (ref 8.4–10.5)
CHLORIDE: 104 meq/L (ref 96–112)
CO2: 28 mEq/L (ref 19–32)
CREATININE: 0.86 mg/dL (ref 0.50–1.10)
GFR, EST NON AFRICAN AMERICAN: 82 mL/min
GLUCOSE: 82 mg/dL (ref 70–99)
Potassium: 4.7 mEq/L (ref 3.5–5.3)
Sodium: 138 mEq/L (ref 135–145)
Total Bilirubin: 0.5 mg/dL (ref 0.2–1.2)
Total Protein: 6.7 g/dL (ref 6.0–8.3)

## 2015-03-05 LAB — TSH: TSH: 2.28 u[IU]/mL (ref 0.35–4.50)

## 2015-05-10 ENCOUNTER — Encounter: Payer: BC Managed Care – PPO | Admitting: Family Medicine

## 2015-06-22 IMAGING — MG MM DIAGNOSTIC UNILATERAL R
3 series · 3 of 3 positions shown · non-contrast
Comparison: 01/10/2010 through 02/09/2014 mammograms

CLINICAL DATA: Patient is a 43-year-old female who reports focal
pain in the upper outer right breast. She states that there are no
palpable masses of concern.

EXAM:
DIGITAL DIAGNOSTIC  RIGHT MAMMOGRAM WITH CAD
ULTRASOUND RIGHT BREAST

[R CC]
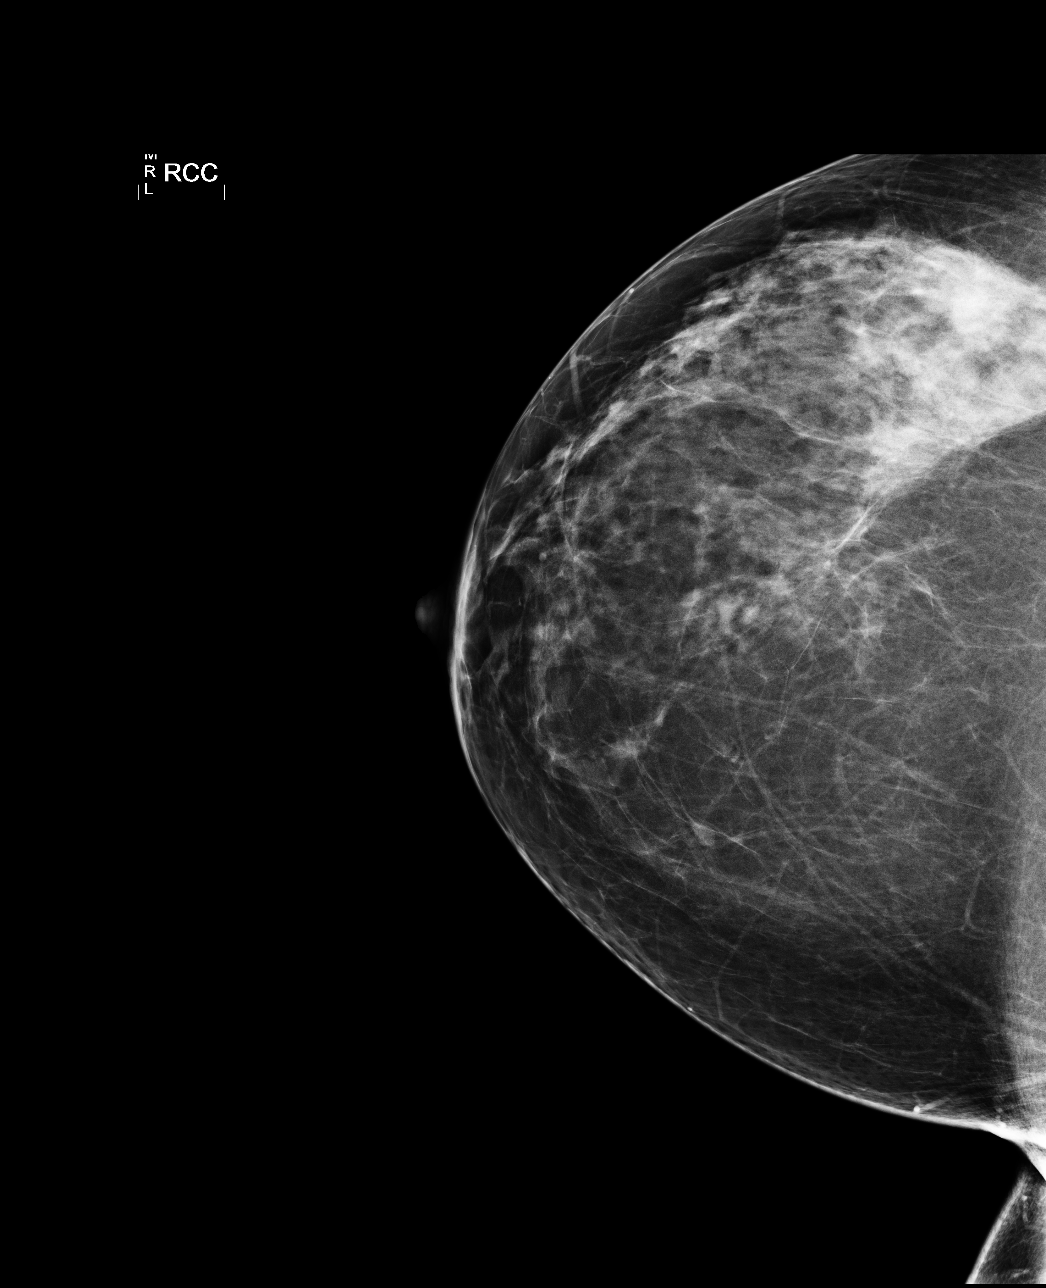

[R MLO]
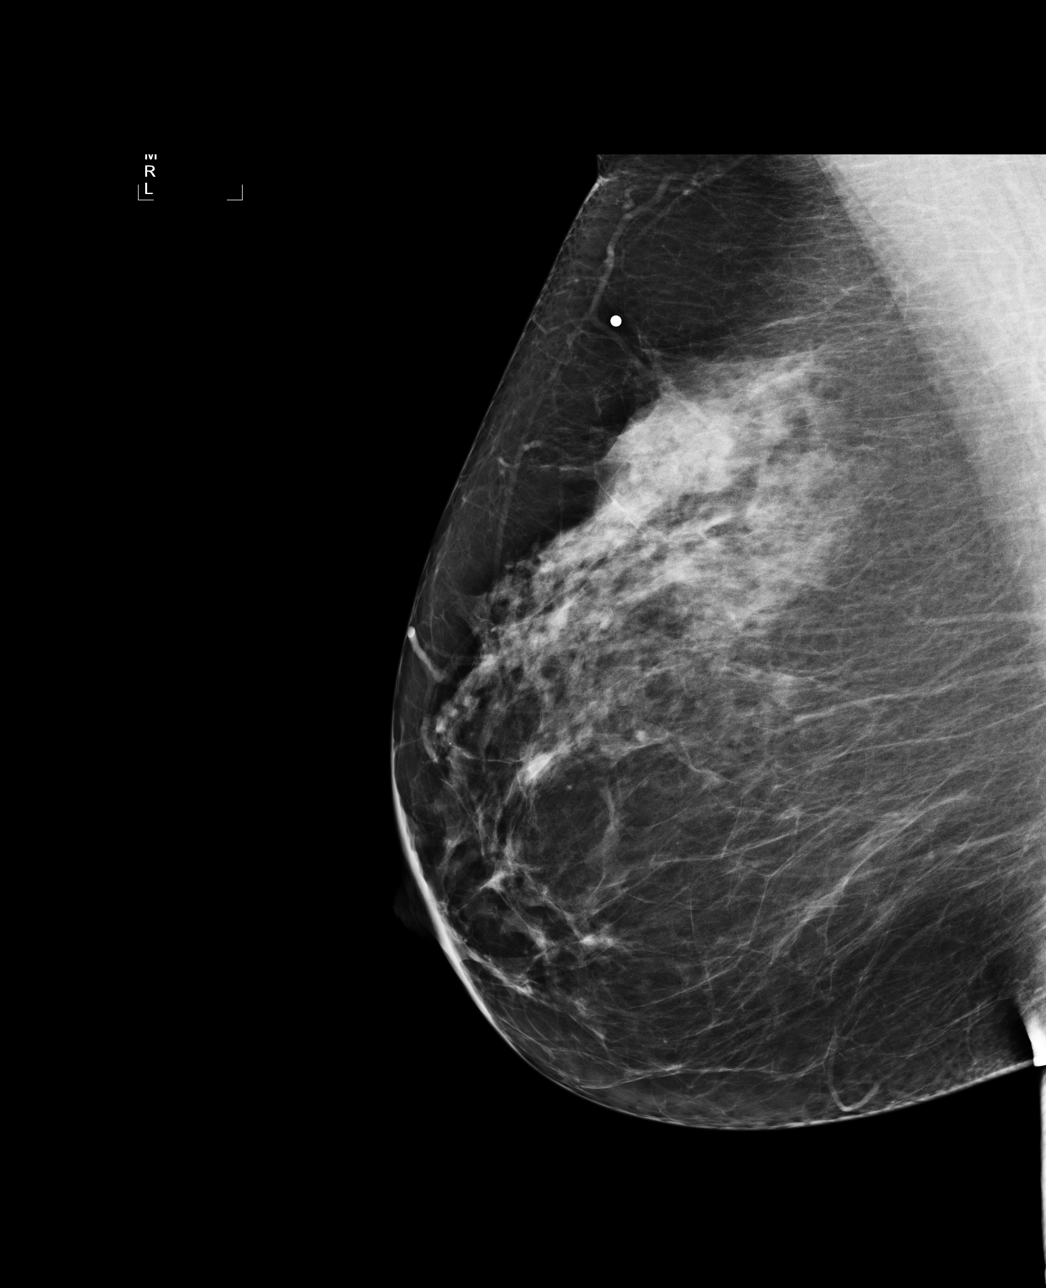

[R TAN]
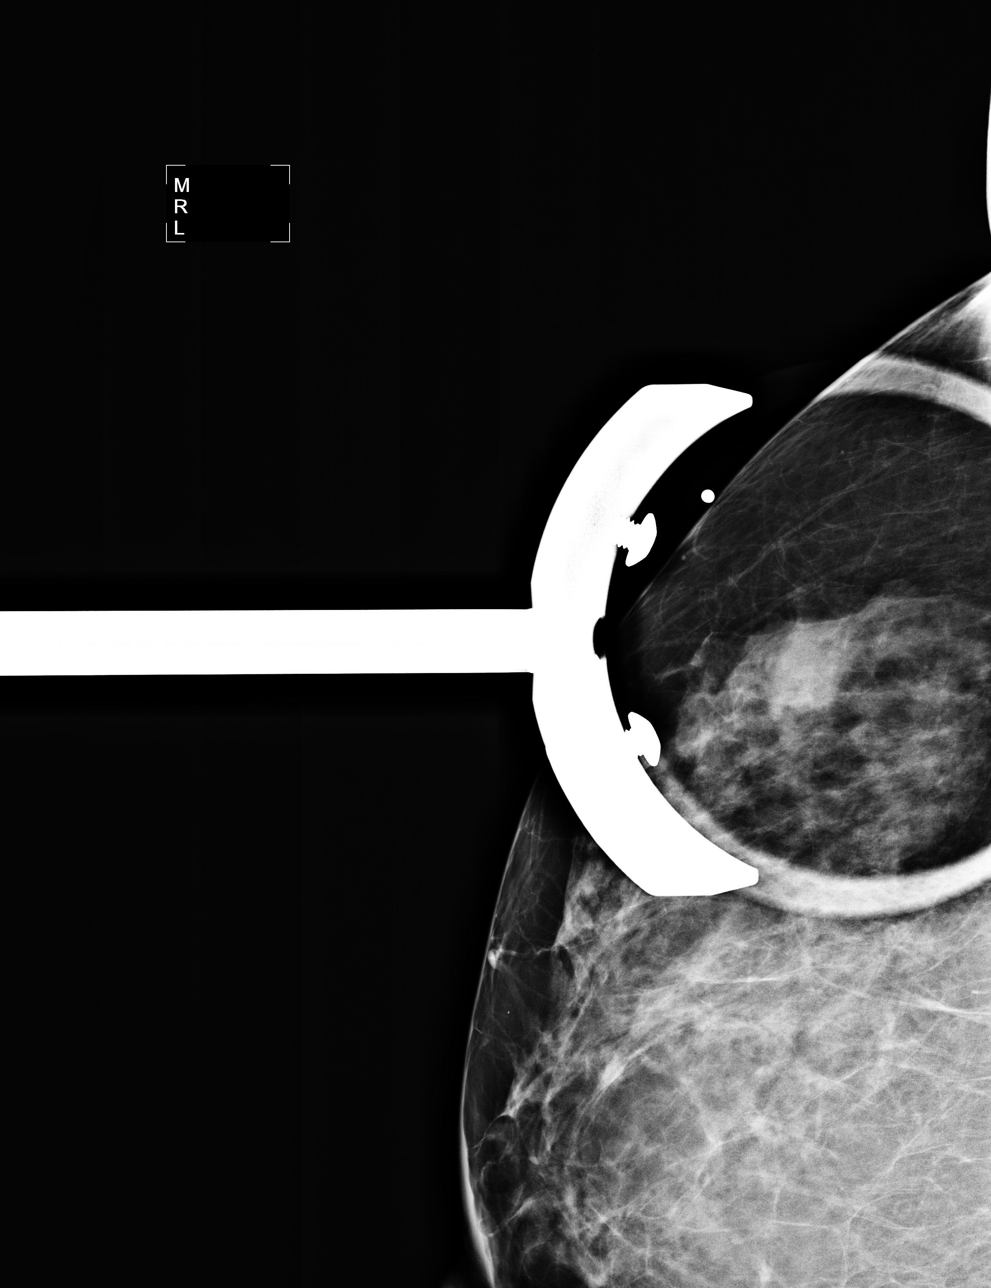

[3 of 3 positions shown; findings below may reference images not displayed]

ACR Breast Density Category c: The breast tissue is heterogeneously
dense, which may obscure small masses.
FINDINGS: Right unilateral digital diagnostic mammography demonstrates a
heterogeneously dense parenchymal pattern which may obscure
detection of small masses. A metallic BB has been placed by the
technologist overlying the upper outer right breast denoting the
region of concern as indicated by the patient. Spot-compression view
and tangential projection reveals a 13 mm diameter oval mass with
obscured margins. This has decreased in size from comparison
mammography performed 02/09/2014 consistent with benign etiology. No
new mass or suspicious microcalcification.

Mammographic images were processed with CAD.

On physical exam, no palpable masses are noted.

Ultrasound is performed, showing a minimally complicated cyst with
benign sonographic features in the upper outer quadrant at 10
o'clock 8 cm from the nipple measuring 13 x 8 x 12 mm. This
corresponds to the mass noted on mammography there has intervally
decreased in size and consistent with benign etiology. No internal
vascularity is demonstrated on Doppler imaging. A few additional sub
cm benign anechoic simple cysts and minimally complicated cysts are
identified in the upper outer quadrant under real-time sonography.
No conspicuous solid mass.
IMPRESSION: 1. No mammographic or sonographic findings of malignancy in the
right breast.
2. A benign minimally complicated cyst in the upper outer quadrant
of the right breast at 10 o'clock 8 cm from the nipple.

RECOMMENDATION:
1. Recommend continued clinical management for patient's right
mastalgia. Further management options were discussed regarding the
benign cyst in the upper outer quadrant including ultrasound-guided
cyst aspiration for symptomatic relief.
2. Screening mammogram in February 2015 unless there are persistent or
intervening clinical concerns.(Code:[id number]-S-J7W)

I have discussed the findings and recommendations with the patient.
Results were also provided in writing at the conclusion of the
visit. If applicable, a reminder letter will be sent to the patient
regarding the next appointment.

BI-RADS CATEGORY  2: Benign.

## 2015-08-28 ENCOUNTER — Other Ambulatory Visit: Payer: Self-pay | Admitting: Family Medicine

## 2015-08-28 NOTE — Telephone Encounter (Signed)
Ok to refill Alprazolam as requested. Same strength, same sig, same # with 2 rf if due and make sure she is seen close to the 6 month mark

## 2015-08-29 ENCOUNTER — Telehealth: Payer: Self-pay | Admitting: Family Medicine

## 2015-08-29 NOTE — Telephone Encounter (Signed)
RX filled awaiting signature

## 2015-08-29 NOTE — Telephone Encounter (Signed)
Noted.  Message forwarded to provider for FYI.

## 2015-08-29 NOTE — Telephone Encounter (Signed)
TELEPHONE ADVICE RECORD [facility]  Patient Name: [name]  DOB: 1971/11/12    Initial Comment Caller states since Sunday having watery diarrhea; at least 2 occasions had blood in it; about 12:30 am had blood in stool; having some abd pain   Nurse Assessment  Nurse: Brandi Luster, RN, [name] Date/Time [name] Time): 08/29/2015 3:06:44 PM  Confirm and document reason for call. If symptomatic, describe symptoms. ---Caller states since Sunday having watery diarrhea; at least 2 occasions had blood in it; about 12:30 am had blood in stool; having some abd pain. Reports that she had frequent diarrhea on Sunday and Monday and Tuesday the diarrhea was less. She noted blood on the toilet paper. Reports that abd has been gassy but no nausea/vomiting. Drinking well. Denies fever.  Has the patient traveled out of the country within the last 30 days? ---No  Does the patient require triage? ---Yes  Related visit to physician within the last 2 weeks? ---No  Does the PT have any chronic conditions? (i.e. diabetes, asthma, etc.) ---No  Did the patient indicate they were pregnant? ---No     Guidelines    Guideline Title Affirmed Question Affirmed Notes  Diarrhea [1] MODERATE diarrhea (e.g., 4-6 times / day more than normal) AND [2] present > 48 hours (2 days)    Final Disposition User   See Physician within 24 Hours Carl, RN, [name]    Comments  Caller scheduled for appt on 08/30/15 @ 4pm with Dr. Laury Axon at the [facility] office.   Referrals  REFERRED TO PCP OFFICE   Disagree/Comply: Comply

## 2015-08-30 ENCOUNTER — Ambulatory Visit: Payer: BLUE CROSS/BLUE SHIELD | Admitting: Family Medicine

## 2015-08-30 NOTE — Telephone Encounter (Signed)
Faxed hardcopy for alprazolam to [facility]

## 2015-09-03 ENCOUNTER — Ambulatory Visit (INDEPENDENT_AMBULATORY_CARE_PROVIDER_SITE_OTHER): Payer: BLUE CROSS/BLUE SHIELD | Admitting: Family Medicine

## 2015-09-03 ENCOUNTER — Encounter: Payer: Self-pay | Admitting: Family Medicine

## 2015-09-03 VITALS — BP 104/66 | HR 79 | Temp 98.3°F | Ht 67.5 in | Wt 198.4 lb

## 2015-09-03 DIAGNOSIS — F418 Other specified anxiety disorders: Secondary | ICD-10-CM | POA: Diagnosis not present

## 2015-09-03 DIAGNOSIS — E669 Obesity, unspecified: Secondary | ICD-10-CM | POA: Diagnosis not present

## 2015-09-03 DIAGNOSIS — Z23 Encounter for immunization: Secondary | ICD-10-CM | POA: Diagnosis not present

## 2015-09-03 DIAGNOSIS — Z0001 Encounter for general adult medical examination with abnormal findings: Secondary | ICD-10-CM

## 2015-09-03 DIAGNOSIS — K219 Gastro-esophageal reflux disease without esophagitis: Secondary | ICD-10-CM | POA: Diagnosis not present

## 2015-09-03 DIAGNOSIS — K625 Hemorrhage of anus and rectum: Secondary | ICD-10-CM

## 2015-09-03 DIAGNOSIS — Z0000 Encounter for general adult medical examination without abnormal findings: Secondary | ICD-10-CM

## 2015-09-03 DIAGNOSIS — F419 Anxiety disorder, unspecified: Secondary | ICD-10-CM

## 2015-09-03 DIAGNOSIS — F329 Major depressive disorder, single episode, unspecified: Secondary | ICD-10-CM

## 2015-09-03 DIAGNOSIS — F32A Depression, unspecified: Secondary | ICD-10-CM

## 2015-09-03 HISTORY — DX: Obesity, unspecified: E66.9

## 2015-09-03 MED ORDER — FLUOXETINE HCL 40 MG PO CAPS: 40.0000 mg | ORAL_CAPSULE | Freq: Every day | ORAL | Status: DC

## 2015-09-03 NOTE — Assessment & Plan Note (Signed)
No recent episodes

## 2015-09-03 NOTE — Assessment & Plan Note (Signed)
Encouraged DASH diet, decrease po intake and increase exercise as tolerated. Needs 7-8 hours of sleep nightly. Avoid trans fats, eat small, frequent meals every 4-5 hours with lean proteins, complex carbs and healthy fats. Minimize simple carbs, GMO foods. 

## 2015-09-03 NOTE — Progress Notes (Signed)
Subjective:    Patient ID: [name], female    DOB: [birth year], 44 y.o.   MRN: [id number]  Chief Complaint  Patient presents with  . Annual Exam    HPI Patient is in today for annual exam. 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 She is noting anxiety and difficulty sleeping at times. No suicidal ideation but there is some anhedonia. She did have a recent episode of loose watery stools recently but those symptoms have resolved. Her menstrual cycles have shortened somewhat but no significant increase in bleeding or pain. No fevers or chills. No other acute concerns. Denies CP/palp/SOB/HA/congestion/fevers/GI or GU c/o. Taking meds as prescribed  Past Medical History  Diagnosis Date  . Granuloma annulare     bx confirmed, improved  . Anxiety   . Hx: UTI (urinary tract infection)     in childhood, resolved with urethral stretching  . Palpitations   . Preventative health care 08/26/2011  . Anxiety and depression 10/27/2011  . Toe pain, right 04/07/2012  . URI (upper respiratory infection) 01/09/2013  . Left flank pain 08/07/2013  . Acute upper respiratory infections of unspecified site 08/21/2014  . Obesity 09/03/2015    Past Surgical History  Procedure Laterality Date  . Cesarean section    . Cholecystectomy    . Tubal ligation    . Bunionectomy      left  . Tonsillectomy    . Urethral stricture dilatation      childhood    Family History  Problem Relation Age of Onset  . Hypertension Mother   . Depression Father     suicide  . Hypertension Father   . COPD Father     smoker  . Alzheimer's disease Maternal Grandmother   . Alzheimer's disease Maternal Grandfather   . Heart disease Paternal Grandfather     MI  . Hypertension Brother     Social History   Social History  . Marital Status: Married    Spouse Name: N/A  . Number of Children: N/A  . Years of Education: N/A    Occupational History  . Not on file.   Social History Main Topics  . Smoking status: Never Smoker   . Smokeless tobacco: Never Used  . Alcohol Use: No  . Drug Use: No  . Sexual Activity: Yes     Comment: lives with husband and twins. teaches K at [employer]. no dietary restrictions   Other Topics Concern  . Not on file   Social History Narrative    Outpatient Prescriptions Prior to Visit  Medication Sig Dispense Refill  . ALPRAZolam (XANAX) 0.25 MG tablet TAKE 1 TABLET BY MOUTH TWICE A DAY AS NEEDED 30 tablet 2  . meloxicam (MOBIC) 15 MG tablet   0  . ranitidine (ZANTAC) 150 MG tablet Take 1 tablet (150 mg total) by mouth 2 (two) times daily as needed for heartburn. 60 tablet 3  . citalopram (CELEXA) 20 MG tablet Take 1 tablet (20 mg total) by mouth 2 (two) times daily. 180 tablet 1  . amoxicillin (AMOXIL) 875 MG tablet Take 1 tablet (875 mg total) by mouth 2 (two) times daily. 20 tablet 0  . lidocaine (XYLOCAINE) 2 % solution Use as directed 10 mLs in the mouth or throat as needed for mouth pain. Gargle & spit as needed for comfort. 100 mL 0   No facility-administered medications prior to visit.  Allergies  Allergen Reactions  . Contrast Media [Iodinated Diagnostic Agents]     Review of Systems  Constitutional: Positive for malaise/fatigue. Negative for fever and chills.  HENT: Negative for congestion and hearing loss.   Eyes: Negative for discharge.  Respiratory: Negative for cough, sputum production and shortness of breath.   Cardiovascular: Negative for chest pain, palpitations and leg swelling.  Gastrointestinal: Negative for heartburn, nausea, vomiting, abdominal pain, diarrhea, constipation and blood in stool.  Genitourinary: Negative for dysuria, urgency, frequency and hematuria.  Musculoskeletal: Negative for myalgias, back pain and falls.  Skin: Negative for rash.  Neurological: Negative for dizziness, sensory change, loss of consciousness, weakness and  headaches.  Endo/Heme/Allergies: Negative for environmental allergies. Does not bruise/bleed easily.  Psychiatric/Behavioral: Positive for depression. Negative for suicidal ideas. The patient is nervous/anxious. The patient does not have insomnia.        Objective:    Physical Exam  Constitutional: She is oriented to person, place, and time. She appears well-developed and well-nourished. No distress.  HENT:  Head: Normocephalic and atraumatic.  Eyes: Conjunctivae are normal.  Neck: Neck supple. No thyromegaly present.  Cardiovascular: Normal rate, regular rhythm and normal heart sounds.   No murmur heard. Pulmonary/Chest: Effort normal and breath sounds normal. No respiratory distress.  Abdominal: Soft. Bowel sounds are normal. She exhibits no distension and no mass. There is no tenderness.  Musculoskeletal: She exhibits no edema.  Lymphadenopathy:    She has no cervical adenopathy.  Neurological: She is alert and oriented to person, place, and time.  Skin: Skin is warm and dry.  Psychiatric: She has a normal mood and affect. Her behavior is normal.    BP 104/66 mmHg  Pulse 79  Temp(Src) 98.3 F (36.8 C) (Oral)  Ht 5' 7.5" (1.715 m)  Wt 198 lb 6 oz (89.982 kg)  BMI 30.59 kg/m2  SpO2 96% Wt Readings from Last 3 Encounters:  09/03/15 198 lb 6 oz (89.982 kg)  02/22/15 191 lb (86.637 kg)  12/04/14 191 lb 9.6 oz (86.909 kg)     Lab Results  Component Value Date   WBC 4.9 03/05/2015   HGB 13.9 03/05/2015   HCT 40.7 03/05/2015   PLT 255.0 03/05/2015   GLUCOSE 82 03/05/2015   CHOL 154 03/05/2015   TRIG 87.0 03/05/2015   HDL 43.80 03/05/2015   LDLCALC 93 03/05/2015   ALT 19 03/05/2015   AST 16 03/05/2015   NA 138 03/05/2015   K 4.7 03/05/2015   CL 104 03/05/2015   CREATININE 0.86 03/05/2015   BUN 12 03/05/2015   CO2 28 03/05/2015   TSH 2.28 03/05/2015    Lab Results  Component Value Date   TSH 2.28 03/05/2015   Lab Results  Component Value Date   WBC 4.9  03/05/2015   HGB 13.9 03/05/2015   HCT 40.7 03/05/2015   MCV 86.3 03/05/2015   PLT 255.0 03/05/2015   Lab Results  Component Value Date   NA 138 03/05/2015   K 4.7 03/05/2015   CO2 28 03/05/2015   GLUCOSE 82 03/05/2015   BUN 12 03/05/2015   CREATININE 0.86 03/05/2015   BILITOT 0.5 03/05/2015   ALKPHOS 54 03/05/2015   AST 16 03/05/2015   ALT 19 03/05/2015   PROT 6.7 03/05/2015   ALBUMIN 4.0 03/05/2015   CALCIUM 8.7 03/05/2015   GFR 69.08 01/07/2013   Lab Results  Component Value Date   CHOL 154 03/05/2015   Lab Results  Component Value Date   HDL  43.80 03/05/2015   Lab Results  Component Value Date   LDLCALC 93 03/05/2015   Lab Results  Component Value Date   TRIG 87.0 03/05/2015   Lab Results  Component Value Date   CHOLHDL 4 03/05/2015   No results found for: HGBA1C     Assessment & Plan:   Problem List Items Addressed This Visit    RECTAL BLEEDING    No recent episodes.       Preventative health care    Patient encouraged to maintain heart healthy diet, regular exercise, adequate sleep. Consider daily probiotics. Take medications as prescribed. Follows with Dr Henderson Cloud of GYN. Given and reviewed copy of ACP documents from [employer] and encouraged to complete and return      Obesity    Encouraged DASH diet, decrease po intake and increase exercise as tolerated. Needs 7-8 hours of sleep nightly. Avoid trans fats, eat small, frequent meals every 4-5 hours with lean proteins, complex carbs and healthy fats. Minimize simple carbs, GMO foods.      GERD (gastroesophageal reflux disease)    Avoid offending foods, start probiotics. Do not eat large meals in late evening and consider raising head of bed.       Anxiety and depression    Struggling with increased stressors, feels the Citalopram is not working as well. Will start Fluoxetine 40 mg daily and monitor.        Other Visit Diagnoses    Encounter for immunization    -  Primary        I have discontinued Ms. Ocheltree's citalopram, lidocaine, and amoxicillin. I am also having her start on FLUoxetine. Additionally, I am having her maintain her ranitidine, meloxicam, and ALPRAZolam.  Meds ordered this encounter  Medications  . FLUoxetine (PROZAC) 40 MG capsule    Sig: Take 1 capsule (40 mg total) by mouth daily.    Dispense:  30 capsule    Refill:  3     Danise Edge, MD

## 2015-09-03 NOTE — Assessment & Plan Note (Signed)
Avoid offending foods, start probiotics. Do not eat large meals in late evening and consider raising head of bed.  

## 2015-09-03 NOTE — Patient Instructions (Signed)
Preventive Care for Adults A healthy lifestyle and preventive care can promote health and wellness. Preventive health guidelines for women include the following key practices.  A routine yearly physical is a good way to check with your health care provider about your health and preventive screening. It is a chance to share any concerns and updates on your health and to receive a thorough exam.  Visit your dentist for a routine exam and preventive care every 6 months. Brush your teeth twice a day and floss once a day. Good oral hygiene prevents tooth decay and gum disease.  The frequency of eye exams is based on your age, health, family medical history, use of contact lenses, and other factors. Follow your health care provider's recommendations for frequency of eye exams.  Eat a healthy diet. Foods like vegetables, fruits, whole grains, low-fat dairy products, and lean protein foods contain the nutrients you need without too many calories. Decrease your intake of foods high in solid fats, added sugars, and salt. Eat the right amount of calories for you.Get information about a proper diet from your health care provider, if necessary.  Regular physical exercise is one of the most important things you can do for your health. Most adults should get at least 150 minutes of moderate-intensity exercise (any activity that increases your heart rate and causes you to sweat) each week. In addition, most adults need muscle-strengthening exercises on 2 or more days a week.  Maintain a healthy weight. The body mass index (BMI) is a screening tool to identify possible weight problems. It provides an estimate of body fat based on height and weight. Your health care provider can find your BMI and can help you achieve or maintain a healthy weight.For adults 20 years and older:  A BMI below 18.5 is considered underweight.  A BMI of 18.5 to 24.9 is normal.  A BMI of 25 to 29.9 is considered overweight.  A BMI of  30 and above is considered obese.  Maintain normal blood lipids and cholesterol levels by exercising and minimizing your intake of saturated fat. Eat a balanced diet with plenty of fruit and vegetables. Blood tests for lipids and cholesterol should begin at age 76 and be repeated every 5 years. If your lipid or cholesterol levels are high, you are over 50, or you are at high risk for heart disease, you may need your cholesterol levels checked more frequently.Ongoing high lipid and cholesterol levels should be treated with medicines if diet and exercise are not working.  If you smoke, find out from your health care provider how to quit. If you do not use tobacco, do not start.  Lung cancer screening is recommended for adults aged 22-80 years who are at high risk for developing lung cancer because of a history of smoking. A yearly low-dose CT scan of the lungs is recommended for people who have at least a 30-pack-year history of smoking and are a current smoker or have quit within the past 15 years. A pack year of smoking is smoking an average of 1 pack of cigarettes a day for 1 year (for example: 1 pack a day for 30 years or 2 packs a day for 15 years). Yearly screening should continue until the smoker has stopped smoking for at least 15 years. Yearly screening should be stopped for people who develop a health problem that would prevent them from having lung cancer treatment.  If you are pregnant, do not drink alcohol. If you are breastfeeding,  be very cautious about drinking alcohol. If you are not pregnant and choose to drink alcohol, do not have more than 1 drink per day. One drink is considered to be 12 ounces (355 mL) of beer, 5 ounces (148 mL) of wine, or 1.5 ounces (44 mL) of liquor.  Avoid use of street drugs. Do not share needles with anyone. Ask for help if you need support or instructions about stopping the use of drugs.  High blood pressure causes heart disease and increases the risk of  stroke. Your blood pressure should be checked at least every 1 to 2 years. Ongoing high blood pressure should be treated with medicines if weight loss and exercise do not work.  If you are 75-52 years old, ask your health care provider if you should take aspirin to prevent strokes.  Diabetes screening involves taking a blood sample to check your fasting blood sugar level. This should be done once every 3 years, after age 15, if you are within normal weight and without risk factors for diabetes. Testing should be considered at a younger age or be carried out more frequently if you are overweight and have at least 1 risk factor for diabetes.  Breast cancer screening is essential preventive care for women. You should practice "breast self-awareness." This means understanding the normal appearance and feel of your breasts and may include breast self-examination. Any changes detected, no matter how small, should be reported to a health care provider. Women in their 58s and 30s should have a clinical breast exam (CBE) by a health care provider as part of a regular health exam every 1 to 3 years. After age 16, women should have a CBE every year. Starting at age 53, women should consider having a mammogram (breast X-ray test) every year. Women who have a family history of breast cancer should talk to their health care provider about genetic screening. Women at a high risk of breast cancer should talk to their health care providers about having an MRI and a mammogram every year.  Breast cancer gene (BRCA)-related cancer risk assessment is recommended for women who have family members with BRCA-related cancers. BRCA-related cancers include breast, ovarian, tubal, and peritoneal cancers. Having family members with these cancers may be associated with an increased risk for harmful changes (mutations) in the breast cancer genes BRCA1 and BRCA2. Results of the assessment will determine the need for genetic counseling and  BRCA1 and BRCA2 testing.  Routine pelvic exams to screen for cancer are no longer recommended for nonpregnant women who are considered low risk for cancer of the pelvic organs (ovaries, uterus, and vagina) and who do not have symptoms. Ask your health care provider if a screening pelvic exam is right for you.  If you have had past treatment for cervical cancer or a condition that could lead to cancer, you need Pap tests and screening for cancer for at least 20 years after your treatment. If Pap tests have been discontinued, your risk factors (such as having a new sexual partner) need to be reassessed to determine if screening should be resumed. Some women have medical problems that increase the chance of getting cervical cancer. In these cases, your health care provider may recommend more frequent screening and Pap tests.  The HPV test is an additional test that may be used for cervical cancer screening. The HPV test looks for the virus that can cause the cell changes on the cervix. The cells collected during the Pap test can be  tested for HPV. The HPV test could be used to screen women aged 30 years and older, and should be used in women of any age who have unclear Pap test results. After the age of 30, women should have HPV testing at the same frequency as a Pap test.  Colorectal cancer can be detected and often prevented. Most routine colorectal cancer screening begins at the age of 50 years and continues through age 75 years. However, your health care provider may recommend screening at an earlier age if you have risk factors for colon cancer. On a yearly basis, your health care provider may provide home test kits to check for hidden blood in the stool. Use of a small camera at the end of a tube, to directly examine the colon (sigmoidoscopy or colonoscopy), can detect the earliest forms of colorectal cancer. Talk to your health care provider about this at age 50, when routine screening begins. Direct  exam of the colon should be repeated every 5-10 years through age 75 years, unless early forms of pre-cancerous polyps or small growths are found.  People who are at an increased risk for hepatitis B should be screened for this virus. You are considered at high risk for hepatitis B if:  You were born in a country where hepatitis B occurs often. Talk with your health care provider about which countries are considered high risk.  Your parents were born in a high-risk country and you have not received a shot to protect against hepatitis B (hepatitis B vaccine).  You have HIV or AIDS.  You use needles to inject street drugs.  You live with, or have sex with, someone who has hepatitis B.  You get hemodialysis treatment.  You take certain medicines for conditions like cancer, organ transplantation, and autoimmune conditions.  Hepatitis C blood testing is recommended for all people born from 1945 through 1965 and any individual with known risks for hepatitis C.  Practice safe sex. Use condoms and avoid high-risk sexual practices to reduce the spread of sexually transmitted infections (STIs). STIs include gonorrhea, chlamydia, syphilis, trichomonas, herpes, HPV, and human immunodeficiency virus (HIV). Herpes, HIV, and HPV are viral illnesses that have no cure. They can result in disability, cancer, and death.  You should be screened for sexually transmitted illnesses (STIs) including gonorrhea and chlamydia if:  You are sexually active and are younger than 24 years.  You are older than 24 years and your health care provider tells you that you are at risk for this type of infection.  Your sexual activity has changed since you were last screened and you are at an increased risk for chlamydia or gonorrhea. Ask your health care provider if you are at risk.  If you are at risk of being infected with HIV, it is recommended that you take a prescription medicine daily to prevent HIV infection. This is  called preexposure prophylaxis (PrEP). You are considered at risk if:  You are a heterosexual woman, are sexually active, and are at increased risk for HIV infection.  You take drugs by injection.  You are sexually active with a partner who has HIV.  Talk with your health care provider about whether you are at high risk of being infected with HIV. If you choose to begin PrEP, you should first be tested for HIV. You should then be tested every 3 months for as long as you are taking PrEP.  Osteoporosis is a disease in which the bones lose minerals and strength   with aging. This can result in serious bone fractures or breaks. The risk of osteoporosis can be identified using a bone density scan. Women ages 65 years and over and women at risk for fractures or osteoporosis should discuss screening with their health care providers. Ask your health care provider whether you should take a calcium supplement or vitamin D to reduce the rate of osteoporosis.  Menopause can be associated with physical symptoms and risks. Hormone replacement therapy is available to decrease symptoms and risks. You should talk to your health care provider about whether hormone replacement therapy is right for you.  Use sunscreen. Apply sunscreen liberally and repeatedly throughout the day. You should seek shade when your shadow is shorter than you. Protect yourself by wearing long sleeves, pants, a wide-brimmed hat, and sunglasses year round, whenever you are outdoors.  Once a month, do a whole body skin exam, using a mirror to look at the skin on your back. Tell your health care provider of new moles, moles that have irregular borders, moles that are larger than a pencil eraser, or moles that have changed in shape or color.  Stay current with required vaccines (immunizations).  Influenza vaccine. All adults should be immunized every year.  Tetanus, diphtheria, and acellular pertussis (Td, Tdap) vaccine. Pregnant women should  receive 1 dose of Tdap vaccine during each pregnancy. The dose should be obtained regardless of the length of time since the last dose. Immunization is preferred during the 27th-36th week of gestation. An adult who has not previously received Tdap or who does not know her vaccine status should receive 1 dose of Tdap. This initial dose should be followed by tetanus and diphtheria toxoids (Td) booster doses every 10 years. Adults with an unknown or incomplete history of completing a 3-dose immunization series with Td-containing vaccines should begin or complete a primary immunization series including a Tdap dose. Adults should receive a Td booster every 10 years.  Varicella vaccine. An adult without evidence of immunity to varicella should receive 2 doses or a second dose if she has previously received 1 dose. Pregnant females who do not have evidence of immunity should receive the first dose after pregnancy. This first dose should be obtained before leaving the health care facility. The second dose should be obtained 4-8 weeks after the first dose.  Human papillomavirus (HPV) vaccine. Females aged 13-26 years who have not received the vaccine previously should obtain the 3-dose series. The vaccine is not recommended for use in pregnant females. However, pregnancy testing is not needed before receiving a dose. If a female is found to be pregnant after receiving a dose, no treatment is needed. In that case, the remaining doses should be delayed until after the pregnancy. Immunization is recommended for any person with an immunocompromised condition through the age of 26 years if she did not get any or all doses earlier. During the 3-dose series, the second dose should be obtained 4-8 weeks after the first dose. The third dose should be obtained 24 weeks after the first dose and 16 weeks after the second dose.  Zoster vaccine. One dose is recommended for adults aged 60 years or older unless certain conditions are  present.  Measles, mumps, and rubella (MMR) vaccine. Adults born before 1957 generally are considered immune to measles and mumps. Adults born in 1957 or later should have 1 or more doses of MMR vaccine unless there is a contraindication to the vaccine or there is laboratory evidence of immunity to   each of the three diseases. A routine second dose of MMR vaccine should be obtained at least 28 days after the first dose for students attending postsecondary schools, health care workers, or international travelers. People who received inactivated measles vaccine or an unknown type of measles vaccine during 1963-1967 should receive 2 doses of MMR vaccine. People who received inactivated mumps vaccine or an unknown type of mumps vaccine before 1979 and are at high risk for mumps infection should consider immunization with 2 doses of MMR vaccine. For females of childbearing age, rubella immunity should be determined. If there is no evidence of immunity, females who are not pregnant should be vaccinated. If there is no evidence of immunity, females who are pregnant should delay immunization until after pregnancy. Unvaccinated health care workers born before 1957 who lack laboratory evidence of measles, mumps, or rubella immunity or laboratory confirmation of disease should consider measles and mumps immunization with 2 doses of MMR vaccine or rubella immunization with 1 dose of MMR vaccine.  Pneumococcal 13-valent conjugate (PCV13) vaccine. When indicated, a person who is uncertain of her immunization history and has no record of immunization should receive the PCV13 vaccine. An adult aged 19 years or older who has certain medical conditions and has not been previously immunized should receive 1 dose of PCV13 vaccine. This PCV13 should be followed with a dose of pneumococcal polysaccharide (PPSV23) vaccine. The PPSV23 vaccine dose should be obtained at least 8 weeks after the dose of PCV13 vaccine. An adult aged 19  years or older who has certain medical conditions and previously received 1 or more doses of PPSV23 vaccine should receive 1 dose of PCV13. The PCV13 vaccine dose should be obtained 1 or more years after the last PPSV23 vaccine dose.  Pneumococcal polysaccharide (PPSV23) vaccine. When PCV13 is also indicated, PCV13 should be obtained first. All adults aged 65 years and older should be immunized. An adult younger than age 65 years who has certain medical conditions should be immunized. Any person who resides in a nursing home or long-term care facility should be immunized. An adult smoker should be immunized. People with an immunocompromised condition and certain other conditions should receive both PCV13 and PPSV23 vaccines. People with human immunodeficiency virus (HIV) infection should be immunized as soon as possible after diagnosis. Immunization during chemotherapy or radiation therapy should be avoided. Routine use of PPSV23 vaccine is not recommended for American Indians, [place] Natives, or people younger than 65 years unless there are medical conditions that require PPSV23 vaccine. When indicated, people who have unknown immunization and have no record of immunization should receive PPSV23 vaccine. One-time revaccination 5 years after the first dose of PPSV23 is recommended for people aged 19-64 years who have chronic kidney failure, nephrotic syndrome, asplenia, or immunocompromised conditions. People who received 1-2 doses of PPSV23 before age 65 years should receive another dose of PPSV23 vaccine at age 65 years or later if at least 5 years have passed since the previous dose. Doses of PPSV23 are not needed for people immunized with PPSV23 at or after age 65 years.  Meningococcal vaccine. Adults with asplenia or persistent complement component deficiencies should receive 2 doses of quadrivalent meningococcal conjugate (MenACWY-D) vaccine. The doses should be obtained at least 2 months apart.  Microbiologists working with certain meningococcal bacteria, military recruits, people at risk during an outbreak, and people who travel to or live in countries with a high rate of meningitis should be immunized. A first-year college student up through age   21 years who is living in a residence hall should receive a dose if she did not receive a dose on or after her 16th birthday. Adults who have certain high-risk conditions should receive one or more doses of vaccine.  Hepatitis A vaccine. Adults who wish to be protected from this disease, have certain high-risk conditions, work with hepatitis A-infected animals, work in hepatitis A research labs, or travel to or work in countries with a high rate of hepatitis A should be immunized. Adults who were previously unvaccinated and who anticipate close contact with an international adoptee during the first 60 days after arrival in the [place] from a country with a high rate of hepatitis A should be immunized.  Hepatitis B vaccine. Adults who wish to be protected from this disease, have certain high-risk conditions, may be exposed to blood or other infectious body fluids, are household contacts or sex partners of hepatitis B positive people, are clients or workers in certain care facilities, or travel to or work in countries with a high rate of hepatitis B should be immunized.  Haemophilus influenzae type b (Hib) vaccine. A previously unvaccinated person with asplenia or sickle cell disease or having a scheduled splenectomy should receive 1 dose of Hib vaccine. Regardless of previous immunization, a recipient of a hematopoietic stem cell transplant should receive a 3-dose series 6-12 months after her successful transplant. Hib vaccine is not recommended for adults with HIV infection. Preventive Services / Frequency Ages 64 to 68 years  Blood pressure check.** / Every 1 to 2 years.  Lipid and cholesterol check.** / Every 5 years beginning at age  22.  Clinical breast exam.** / Every 3 years for women in their 88s and 53s.  BRCA-related cancer risk assessment.** / For women who have family members with a BRCA-related cancer (breast, ovarian, tubal, or peritoneal cancers).  Pap test.** / Every 2 years from ages 90 through 51. Every 3 years starting at age 21 through age 56 or 3 with a history of 3 consecutive normal Pap tests.  HPV screening.** / Every 3 years from ages 24 through ages 1 to 46 with a history of 3 consecutive normal Pap tests.  Hepatitis C blood test.** / For any individual with known risks for hepatitis C.  Skin self-exam. / Monthly.  Influenza vaccine. / Every year.  Tetanus, diphtheria, and acellular pertussis (Tdap, Td) vaccine.** / Consult your health care provider. Pregnant women should receive 1 dose of Tdap vaccine during each pregnancy. 1 dose of Td every 10 years.  Varicella vaccine.** / Consult your health care provider. Pregnant females who do not have evidence of immunity should receive the first dose after pregnancy.  HPV vaccine. / 3 doses over 6 months, if 72 and younger. The vaccine is not recommended for use in pregnant females. However, pregnancy testing is not needed before receiving a dose.  Measles, mumps, rubella (MMR) vaccine.** / You need at least 1 dose of MMR if you were born in 1957 or later. You may also need a 2nd dose. For females of childbearing age, rubella immunity should be determined. If there is no evidence of immunity, females who are not pregnant should be vaccinated. If there is no evidence of immunity, females who are pregnant should delay immunization until after pregnancy.  Pneumococcal 13-valent conjugate (PCV13) vaccine.** / Consult your health care provider.  Pneumococcal polysaccharide (PPSV23) vaccine.** / 1 to 2 doses if you smoke cigarettes or if you have certain conditions.  Meningococcal vaccine.** /  1 dose if you are age 19 to 21 years and a first-year college  student living in a residence hall, or have one of several medical conditions, you need to get vaccinated against meningococcal disease. You may also need additional booster doses.  Hepatitis A vaccine.** / Consult your health care provider.  Hepatitis B vaccine.** / Consult your health care provider.  Haemophilus influenzae type b (Hib) vaccine.** / Consult your health care provider. Ages 40 to 64 years  Blood pressure check.** / Every 1 to 2 years.  Lipid and cholesterol check.** / Every 5 years beginning at age 20 years.  Lung cancer screening. / Every year if you are aged 55-80 years and have a 30-pack-year history of smoking and currently smoke or have quit within the past 15 years. Yearly screening is stopped once you have quit smoking for at least 15 years or develop a health problem that would prevent you from having lung cancer treatment.  Clinical breast exam.** / Every year after age 40 years.  BRCA-related cancer risk assessment.** / For women who have family members with a BRCA-related cancer (breast, ovarian, tubal, or peritoneal cancers).  Mammogram.** / Every year beginning at age 40 years and continuing for as long as you are in good health. Consult with your health care provider.  Pap test.** / Every 3 years starting at age 30 years through age 65 or 70 years with a history of 3 consecutive normal Pap tests.  HPV screening.** / Every 3 years from ages 30 years through ages 65 to 70 years with a history of 3 consecutive normal Pap tests.  Fecal occult blood test (FOBT) of stool. / Every year beginning at age 50 years and continuing until age 75 years. You may not need to do this test if you get a colonoscopy every 10 years.  Flexible sigmoidoscopy or colonoscopy.** / Every 5 years for a flexible sigmoidoscopy or every 10 years for a colonoscopy beginning at age 50 years and continuing until age 75 years.  Hepatitis C blood test.** / For all people born from 1945 through  1965 and any individual with known risks for hepatitis C.  Skin self-exam. / Monthly.  Influenza vaccine. / Every year.  Tetanus, diphtheria, and acellular pertussis (Tdap/Td) vaccine.** / Consult your health care provider. Pregnant women should receive 1 dose of Tdap vaccine during each pregnancy. 1 dose of Td every 10 years.  Varicella vaccine.** / Consult your health care provider. Pregnant females who do not have evidence of immunity should receive the first dose after pregnancy.  Zoster vaccine.** / 1 dose for adults aged 60 years or older.  Measles, mumps, rubella (MMR) vaccine.** / You need at least 1 dose of MMR if you were born in 1957 or later. You may also need a 2nd dose. For females of childbearing age, rubella immunity should be determined. If there is no evidence of immunity, females who are not pregnant should be vaccinated. If there is no evidence of immunity, females who are pregnant should delay immunization until after pregnancy.  Pneumococcal 13-valent conjugate (PCV13) vaccine.** / Consult your health care provider.  Pneumococcal polysaccharide (PPSV23) vaccine.** / 1 to 2 doses if you smoke cigarettes or if you have certain conditions.  Meningococcal vaccine.** / Consult your health care provider.  Hepatitis A vaccine.** / Consult your health care provider.  Hepatitis B vaccine.** / Consult your health care provider.  Haemophilus influenzae type b (Hib) vaccine.** / Consult your health care provider. Ages 65   years and over  Blood pressure check.** / Every 1 to 2 years.  Lipid and cholesterol check.** / Every 5 years beginning at age 22 years.  Lung cancer screening. / Every year if you are aged 73-80 years and have a 30-pack-year history of smoking and currently smoke or have quit within the past 15 years. Yearly screening is stopped once you have quit smoking for at least 15 years or develop a health problem that would prevent you from having lung cancer  treatment.  Clinical breast exam.** / Every year after age 4 years.  BRCA-related cancer risk assessment.** / For women who have family members with a BRCA-related cancer (breast, ovarian, tubal, or peritoneal cancers).  Mammogram.** / Every year beginning at age 40 years and continuing for as long as you are in good health. Consult with your health care provider.  Pap test.** / Every 3 years starting at age 9 years through age 34 or 91 years with 3 consecutive normal Pap tests. Testing can be stopped between 65 and 70 years with 3 consecutive normal Pap tests and no abnormal Pap or HPV tests in the past 10 years.  HPV screening.** / Every 3 years from ages 57 years through ages 64 or 45 years with a history of 3 consecutive normal Pap tests. Testing can be stopped between 65 and 70 years with 3 consecutive normal Pap tests and no abnormal Pap or HPV tests in the past 10 years.  Fecal occult blood test (FOBT) of stool. / Every year beginning at age 15 years and continuing until age 17 years. You may not need to do this test if you get a colonoscopy every 10 years.  Flexible sigmoidoscopy or colonoscopy.** / Every 5 years for a flexible sigmoidoscopy or every 10 years for a colonoscopy beginning at age 86 years and continuing until age 71 years.  Hepatitis C blood test.** / For all people born from [birth year] through 1966 and any individual with known risks for hepatitis C.  Osteoporosis screening.** / A one-time screening for women ages 83 years and over and women at risk for fractures or osteoporosis.  Skin self-exam. / Monthly.  Influenza vaccine. / Every year.  Tetanus, diphtheria, and acellular pertussis (Tdap/Td) vaccine.** / 1 dose of Td every 10 years.  Varicella vaccine.** / Consult your health care provider.  Zoster vaccine.** / 1 dose for adults aged 61 years or older.  Pneumococcal 13-valent conjugate (PCV13) vaccine.** / Consult your health care provider.  Pneumococcal  polysaccharide (PPSV23) vaccine.** / 1 dose for all adults aged 28 years and older.  Meningococcal vaccine.** / Consult your health care provider.  Hepatitis A vaccine.** / Consult your health care provider.  Hepatitis B vaccine.** / Consult your health care provider.  Haemophilus influenzae type b (Hib) vaccine.** / Consult your health care provider. ** Family history and personal history of risk and conditions may change your health care provider's recommendations. Document Released: 01/20/2002 Document Revised: 04/10/2014 Document Reviewed: 04/21/2011 [facility] Patient Information 2015 [place], [place]. This information is not intended to replace advice given to you by your health care provider. Make sure you discuss any questions you have with your health care provider.

## 2015-09-03 NOTE — Assessment & Plan Note (Addendum)
Struggling with increased stressors, feels the Citalopram is not working as well. Will start Fluoxetine 40 mg daily and monitor.

## 2015-09-03 NOTE — Assessment & Plan Note (Signed)
Patient encouraged to maintain heart healthy diet, regular exercise, adequate sleep. Consider daily probiotics. Take medications as prescribed. Follows with Dr Henderson Cloud of GYN. Given and reviewed copy of ACP documents from [employer] and encouraged to complete and return

## 2015-09-03 NOTE — Progress Notes (Signed)
Pre visit review using our clinic review tool, if applicable. No additional management support is needed unless otherwise documented below in the visit note. 

## 2015-10-03 ENCOUNTER — Encounter: Payer: Self-pay | Admitting: Gastroenterology

## 2015-11-06 ENCOUNTER — Ambulatory Visit: Payer: BLUE CROSS/BLUE SHIELD | Admitting: Family Medicine

## 2016-01-08 ENCOUNTER — Ambulatory Visit (INDEPENDENT_AMBULATORY_CARE_PROVIDER_SITE_OTHER): Payer: BLUE CROSS/BLUE SHIELD | Admitting: Family Medicine

## 2016-01-08 ENCOUNTER — Encounter: Payer: Self-pay | Admitting: Family Medicine

## 2016-01-08 VITALS — BP 120/72 | HR 72 | Temp 98.1°F | Ht 68.0 in | Wt 193.1 lb

## 2016-01-08 DIAGNOSIS — R002 Palpitations: Secondary | ICD-10-CM

## 2016-01-08 DIAGNOSIS — E669 Obesity, unspecified: Secondary | ICD-10-CM

## 2016-01-08 DIAGNOSIS — R1012 Left upper quadrant pain: Secondary | ICD-10-CM | POA: Diagnosis not present

## 2016-01-08 DIAGNOSIS — Z0000 Encounter for general adult medical examination without abnormal findings: Secondary | ICD-10-CM | POA: Diagnosis not present

## 2016-01-08 DIAGNOSIS — K5900 Constipation, unspecified: Secondary | ICD-10-CM

## 2016-01-08 DIAGNOSIS — K219 Gastro-esophageal reflux disease without esophagitis: Secondary | ICD-10-CM

## 2016-01-08 NOTE — Progress Notes (Signed)
Subjective:    Patient ID: [name], female    DOB: [birth year], 45 y.o.   MRN: [id number]  Chief Complaint  Patient presents with  . Follow-up    Prozac  . Flank Pain    left     HPI Patient is in today for evaluation of left flank pain, she has had symptoms intermittently for about 6 weeks now. She denies any acute illness when this started or any significant change in diet or exercise. bowels have been moving but slower than previously. No bloody or tarry stool. No anorexia, no nausea or vomiting. Notes some low back pain at times as well. No trauma or falls. Denies CP/palp/SOB/HA/congestion/fevers. Taking meds as prescribed  Past Medical History  Diagnosis Date  . Granuloma annulare     bx confirmed, improved  . Anxiety   . Hx: UTI (urinary tract infection)     in childhood, resolved with urethral stretching  . Palpitations   . Preventative health care 08/26/2011  . Anxiety and depression 10/27/2011  . Toe pain, right 04/07/2012  . URI (upper respiratory infection) 01/09/2013  . Left flank pain 08/07/2013  . Acute upper respiratory infections of unspecified site 08/21/2014  . Obesity 09/03/2015    Past Surgical History  Procedure Laterality Date  . Cesarean section    . Cholecystectomy    . Tubal ligation    . Bunionectomy      left  . Tonsillectomy    . Urethral stricture dilatation      childhood    Family History  Problem Relation Age of Onset  . Hypertension Mother   . Depression Father     suicide  . Hypertension Father   . COPD Father     smoker  . Alzheimer's disease Maternal Grandmother   . Alzheimer's disease Maternal Grandfather   . Heart disease Paternal Grandfather     MI  . Hypertension Brother     Social History   Social History  . Marital Status: Married    Spouse Name: N/A  . Number of Children: N/A  . Years of Education: N/A   Occupational History  . Not on file.   Social History Main Topics  . Smoking status: Never  Smoker   . Smokeless tobacco: Never Used  . Alcohol Use: No  . Drug Use: No  . 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 Not on file   Social History Narrative    Outpatient Prescriptions Prior to Visit  Medication Sig Dispense Refill  . ALPRAZolam (XANAX) 0.25 MG tablet TAKE 1 TABLET BY MOUTH TWICE A DAY AS NEEDED 30 tablet 2  . FLUoxetine (PROZAC) 40 MG capsule Take 1 capsule (40 mg total) by mouth daily. 30 capsule 3  . meloxicam (MOBIC) 15 MG tablet   0  . ranitidine (ZANTAC) 150 MG tablet Take 1 tablet (150 mg total) by mouth 2 (two) times daily as needed for heartburn. 60 tablet 3   No facility-administered medications prior to visit.    Allergies  Allergen Reactions  . Contrast Media [Iodinated Diagnostic Agents]     Review of Systems  Constitutional: Negative for fever and malaise/fatigue.  HENT: Negative for congestion.   Eyes: Negative for discharge.  Respiratory: Negative for shortness of breath.   Cardiovascular: Negative for chest pain, palpitations and leg swelling.  Gastrointestinal: Positive for constipation and  blood in stool. Negative for nausea, abdominal pain, diarrhea and melena.  Genitourinary: Positive for frequency and flank pain. Negative for dysuria, urgency and hematuria.  Musculoskeletal: Negative for myalgias and falls.  Skin: Negative for rash.  Neurological: Negative for loss of consciousness and headaches.  Endo/Heme/Allergies: Negative for environmental allergies.  Psychiatric/Behavioral: Negative for depression. The patient is not nervous/anxious.        Objective:    Physical Exam  Constitutional: She is oriented to person, place, and time. She appears well-developed and well-nourished. No distress.  HENT:  Head: Normocephalic and atraumatic.  Nose: Nose normal.  Eyes: Right eye exhibits no discharge. Left eye exhibits no discharge.    Neck: Normal range of motion. Neck supple.  Cardiovascular: Normal rate and regular rhythm.   No murmur heard. Pulmonary/Chest: Effort normal and breath sounds normal.  Abdominal: Soft. Bowel sounds are normal. There is no tenderness.  Musculoskeletal: She exhibits no edema.  Neurological: She is alert and oriented to person, place, and time.  Skin: Skin is warm and dry.  Psychiatric: She has a normal mood and affect.  Nursing note and vitals reviewed.   BP 120/72 mmHg  Pulse 72  Temp(Src) 98.1 F (36.7 C) (Oral)  Ht  (1.727 m)  Wt 193 lb 2 oz (87.601 kg)  BMI 29.37 kg/m2  SpO2 95% Wt Readings from Last 3 Encounters:  01/08/16 193 lb 2 oz (87.601 kg)  09/03/15 198 lb 6 oz (89.982 kg)  02/22/15 191 lb (86.637 kg)     Lab Results  Component Value Date   WBC 4.9 03/05/2015   HGB 13.9 03/05/2015   HCT 40.7 03/05/2015   PLT 255.0 03/05/2015   GLUCOSE 82 03/05/2015   CHOL 154 03/05/2015   TRIG 87.0 03/05/2015   HDL 43.80 03/05/2015   LDLCALC 93 03/05/2015   ALT 19 03/05/2015   AST 16 03/05/2015   NA 138 03/05/2015   K 4.7 03/05/2015   CL 104 03/05/2015   CREATININE 0.86 03/05/2015   BUN 12 03/05/2015   CO2 28 03/05/2015   TSH 2.28 03/05/2015    Lab Results  Component Value Date   TSH 2.28 03/05/2015   Lab Results  Component Value Date   WBC 4.9 03/05/2015   HGB 13.9 03/05/2015   HCT 40.7 03/05/2015   MCV 86.3 03/05/2015   PLT 255.0 03/05/2015   Lab Results  Component Value Date   NA 138 03/05/2015   K 4.7 03/05/2015   CO2 28 03/05/2015   GLUCOSE 82 03/05/2015   BUN 12 03/05/2015   CREATININE 0.86 03/05/2015   BILITOT 0.5 03/05/2015   ALKPHOS 54 03/05/2015   AST 16 03/05/2015   ALT 19 03/05/2015   PROT 6.7 03/05/2015   ALBUMIN 4.0 03/05/2015   CALCIUM 8.7 03/05/2015   GFR 69.08 01/07/2013   Lab Results  Component Value Date   CHOL 154 03/05/2015   Lab Results  Component Value Date   HDL 43.80 03/05/2015   Lab Results  Component  Value Date   LDLCALC 93 03/05/2015   Lab Results  Component Value Date   TRIG 87.0 03/05/2015   Lab Results  Component Value Date   CHOLHDL 4 03/05/2015   No results found for: HGBA1C     Assessment & Plan:   Problem List Items Addressed This Visit    None    Visit Diagnoses    LUQ pain    -  Primary       I am  having Ms. Lievanos maintain her ranitidine, meloxicam, ALPRAZolam, and FLUoxetine.  No orders of the defined types were placed in this encounter.     Reuel Derby, MD

## 2016-01-08 NOTE — Patient Instructions (Signed)

## 2016-01-08 NOTE — Progress Notes (Signed)
Pre visit review using our clinic review tool, if applicable. No additional management support is needed unless otherwise documented below in the visit note. 

## 2016-01-09 LAB — CBC
HCT: 38.4 % (ref 36.0–46.0)
Hemoglobin: 12.8 g/dL (ref 12.0–15.0)
MCHC: 33.2 g/dL (ref 30.0–36.0)
MCV: 89.5 fl (ref 78.0–100.0)
Platelets: 240 10*3/uL (ref 150.0–400.0)
RBC: 4.29 Mil/uL (ref 3.87–5.11)
RDW: 13.2 % (ref 11.5–15.5)
WBC: 4.5 10*3/uL (ref 4.0–10.5)

## 2016-01-09 LAB — TSH: TSH: 1.59 u[IU]/mL (ref 0.35–4.50)

## 2016-01-09 LAB — LIPASE: LIPASE: 29 U/L (ref 11.0–59.0)

## 2016-01-09 LAB — AMYLASE: AMYLASE: 44 U/L (ref 27–131)

## 2016-01-10 ENCOUNTER — Ambulatory Visit (HOSPITAL_BASED_OUTPATIENT_CLINIC_OR_DEPARTMENT_OTHER)
Admission: RE | Admit: 2016-01-10 | Discharge: 2016-01-10 | Disposition: A | Discharge status: Home / Self Care | Payer: BLUE CROSS/BLUE SHIELD | Source: Ambulatory Visit | Attending: Family Medicine | Admitting: Family Medicine

## 2016-01-10 DIAGNOSIS — R1012 Left upper quadrant pain: Secondary | ICD-10-CM | POA: Diagnosis not present

## 2016-01-13 DIAGNOSIS — R1012 Left upper quadrant pain: Secondary | ICD-10-CM | POA: Insufficient documentation

## 2016-01-13 NOTE — Assessment & Plan Note (Signed)
Imaging confirms constipation. Encouraged increased hydration and fiber in diet. Daily probiotics. If bowels not moving can use MOM 2 tbls po in 4 oz of warm prune juice by mouth every 2-3 days. If no results then repeat in 4 hours with  Dulcolax suppository pr, may repeat again in 4 more hours as needed. Seek care if symptoms worsen. Consider daily Miralax and/or Dulcolax if symptoms persist.

## 2016-01-13 NOTE — Assessment & Plan Note (Signed)
Avoid offending foods, start probiotics. Do not eat large meals in late evening and consider raising head of bed.  

## 2016-01-13 NOTE — Assessment & Plan Note (Signed)
Encouraged DASH diet, decrease po intake and increase exercise as tolerated. Needs 7-8 hours of sleep nightly. Avoid trans fats, eat small, frequent meals every 4-5 hours with lean proteins, complex carbs and healthy fats. Minimize simple carbs 

## 2016-01-13 NOTE — Assessment & Plan Note (Signed)
Infrequent. Encouraged to minimize caffeine and report worsening symptoms.

## 2016-01-16 ENCOUNTER — Other Ambulatory Visit: Payer: Self-pay | Admitting: Family Medicine

## 2016-02-21 ENCOUNTER — Other Ambulatory Visit: Payer: Self-pay | Admitting: Family Medicine

## 2016-02-21 MED ORDER — FLUOXETINE HCL 40 MG PO CAPS: ORAL_CAPSULE | ORAL | Status: DC

## 2016-03-18 ENCOUNTER — Other Ambulatory Visit: Payer: Self-pay | Admitting: Family Medicine

## 2016-03-18 NOTE — Telephone Encounter (Signed)
Pt is requesting refill on Alprazolam. Dr. Mariel AloeBlyth's Pt.  Last OV: 01/08/2016 Last Fill: 08/29/2015 #30 and 2RF Pt sig: 1 tablet bid PRN UDS: None  Please advise.

## 2016-03-18 NOTE — Telephone Encounter (Signed)
Refilled with #30 and 1 rf will have

## 2016-03-18 NOTE — Telephone Encounter (Signed)
Cana refill Alprazolam with 30 and 1 rf

## 2016-03-19 NOTE — Telephone Encounter (Signed)
She can have a 30 day supply once she has scheduled an appt

## 2016-03-26 ENCOUNTER — Other Ambulatory Visit: Payer: Self-pay | Admitting: Family Medicine

## 2016-03-26 NOTE — Telephone Encounter (Signed)
Looks like this was filled on 03/18/16? If not can fill

## 2016-04-02 ENCOUNTER — Encounter: Payer: BLUE CROSS/BLUE SHIELD | Admitting: Medical

## 2016-04-02 ENCOUNTER — Telehealth: Payer: Self-pay | Admitting: Family Medicine

## 2016-04-02 NOTE — Telephone Encounter (Signed)
No charge. 

## 2016-04-02 NOTE — Progress Notes (Signed)
This encounter was created in error - please disregard.

## 2016-04-02 NOTE — Telephone Encounter (Signed)
Patient LVM at 9am cancelling 11:30am appointment due to patient feeling better. Charge or no charge

## 2016-04-06 DIAGNOSIS — G44209 Tension-type headache, unspecified, not intractable: Secondary | ICD-10-CM | POA: Diagnosis not present

## 2016-04-22 ENCOUNTER — Ambulatory Visit (INDEPENDENT_AMBULATORY_CARE_PROVIDER_SITE_OTHER): Payer: BLUE CROSS/BLUE SHIELD | Admitting: Family Medicine

## 2016-04-22 ENCOUNTER — Encounter: Payer: Self-pay | Admitting: Family Medicine

## 2016-04-22 VITALS — BP 110/62 | HR 73 | Temp 98.2°F | Ht 67.0 in | Wt 182.0 lb

## 2016-04-22 DIAGNOSIS — R002 Palpitations: Secondary | ICD-10-CM | POA: Diagnosis not present

## 2016-04-22 DIAGNOSIS — K219 Gastro-esophageal reflux disease without esophagitis: Secondary | ICD-10-CM | POA: Diagnosis not present

## 2016-04-22 DIAGNOSIS — E669 Obesity, unspecified: Secondary | ICD-10-CM

## 2016-04-22 MED ORDER — ALPRAZOLAM 0.25 MG PO TABS: 0.2500 mg | ORAL_TABLET | Freq: Two times a day (BID) | ORAL | Status: DC | PRN

## 2016-04-22 MED ORDER — ONDANSETRON HCL 4 MG PO TABS: 4.0000 mg | ORAL_TABLET | Freq: Three times a day (TID) | ORAL | Status: DC | PRN

## 2016-04-22 NOTE — Patient Instructions (Addendum)
Ginger as needed NOW probiotics 10 caps 1 cap daily   Food Choices for Gastroesophageal Reflux Disease, Adult When you have gastroesophageal reflux disease (GERD), the foods you eat and your eating habits are very important. Choosing the right foods can help ease the discomfort of GERD. WHAT GENERAL GUIDELINES DO I NEED TO FOLLOW?  Choose fruits, vegetables, whole grains, low-fat dairy products, and low-fat meat, fish, and poultry.  Limit fats such as oils, salad dressings, butter, nuts, and avocado.  Keep a food diary to identify foods that cause symptoms.  Avoid foods that cause reflux. These may be different for different people.  Eat frequent small meals instead of three large meals each day.  Eat your meals slowly, in a relaxed setting.  Limit fried foods.  Cook foods using methods other than frying.  Avoid drinking alcohol.  Avoid drinking large amounts of liquids with your meals.  Avoid bending over or lying down until 2-3 hours after eating. WHAT FOODS ARE NOT RECOMMENDED? The following are some foods and drinks that may worsen your symptoms: Vegetables Tomatoes. Tomato juice. Tomato and spaghetti sauce. Chili peppers. Onion and garlic. Horseradish. Fruits Oranges, grapefruit, and lemon (fruit and juice). Meats High-fat meats, fish, and poultry. This includes hot dogs, ribs, ham, sausage, salami, and bacon. Dairy Whole milk and chocolate milk. Sour cream. Cream. Butter. Ice cream. Cream cheese.  Beverages Coffee and tea, with or without caffeine. Carbonated beverages or energy drinks. Condiments Hot sauce. Barbecue sauce.  Sweets/Desserts Chocolate and cocoa. Donuts. Peppermint and spearmint. Fats and Oils High-fat foods, including [place] fries and potato chips. Other Vinegar. Strong spices, such as black pepper, white pepper, red pepper, cayenne, curry powder, cloves, ginger, and chili powder. The items listed above may not be a complete list of foods and  beverages to avoid. Contact your dietitian for more information.   This information is not intended to replace advice given to you by your health care provider. Make sure you discuss any questions you have with your health care provider.   Document Released: 11/24/2005 Document Revised: 12/15/2014 Document Reviewed: 09/28/2013 Elsevier Interactive Patient Education [employer]2016 Elsevier Inc.

## 2016-04-22 NOTE — Progress Notes (Signed)
Pre visit review using our clinic review tool, if applicable. No additional management support is needed unless otherwise documented below in the visit note. 

## 2016-04-22 NOTE — Progress Notes (Signed)
Subjective:    Patient ID: [name], female    DOB: July 29, 1971, 45 y.o.   MRN: [id number]  Chief Complaint  Patient presents with  . Follow-up    HPI Patient is in today for follow up.  She is generally doing well. She is noting increased nausea over past 1-2 weeks. No vomiting, diarrhea or fever. No other acute concerns. Denies CP/palp/SOB/HA/congestion/fevers/ or GU c/o. Taking meds as prescribed Past Medical History  Diagnosis Date  . Granuloma annulare     bx confirmed, improved  . Anxiety   . Hx: UTI (urinary tract infection)     in childhood, resolved with urethral stretching  . Palpitations   . Preventative health care 08/26/2011  . Anxiety and depression 10/27/2011  . Toe pain, right 04/07/2012  . URI (upper respiratory infection) 01/09/2013  . Left flank pain 08/07/2013  . Acute upper respiratory infections of unspecified site 08/21/2014  . Obesity 09/03/2015    Past Surgical History  Procedure Laterality Date  . Cesarean section    . Cholecystectomy    . Tubal ligation    . Bunionectomy      left  . Tonsillectomy    . Urethral stricture dilatation      childhood    Family History  Problem Relation Age of Onset  . Hypertension Mother   . Depression Father     suicide  . Hypertension Father   . COPD Father     smoker  . Alzheimer's disease Maternal Grandmother   . Alzheimer's disease Maternal Grandfather   . Heart disease Paternal Grandfather     MI  . Hypertension Brother     Social History   Social History  . Marital Status: Married    Spouse Name: N/A  . Number of Children: N/A  . Years of Education: N/A   Occupational History  . Not on file.   Social History Main Topics  . Smoking status: Never Smoker   . Smokeless tobacco: Never Used  . Alcohol Use: No  . Drug Use: No  . 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 Not on file    Social History Narrative    Outpatient Prescriptions Prior to Visit  Medication Sig Dispense Refill  . FLUoxetine (PROZAC) 40 MG capsule TAKE 1 CAPSULE (40 MG TOTAL) BY MOUTH DAILY. 90 capsule 1  . meloxicam (MOBIC) 15 MG tablet   0  . ranitidine (ZANTAC) 150 MG tablet Take 1 tablet (150 mg total) by mouth 2 (two) times daily as needed for heartburn. 60 tablet 3  . ALPRAZolam (XANAX) 0.25 MG tablet TAKE 1 TABLET BY MOUTH TWICE A DAY AS NEEDED 30 tablet 1   No facility-administered medications prior to visit.    Allergies  Allergen Reactions  . Contrast Media [Iodinated Diagnostic Agents]     Review of Systems  Constitutional: Negative for fever and malaise/fatigue.  HENT: Negative for congestion.   Eyes: Negative for blurred vision.  Respiratory: Negative for shortness of breath.   Cardiovascular: Negative for chest pain, palpitations and leg swelling.  Gastrointestinal: Negative for nausea, abdominal pain and blood in stool.  Genitourinary: Negative for dysuria and frequency.  Musculoskeletal: Negative for falls.  Skin: Negative for rash.  Neurological: Negative for dizziness, loss of consciousness and headaches.  Endo/Heme/Allergies: Negative for environmental allergies.  Psychiatric/Behavioral: Negative for depression. The patient is not  nervous/anxious.        Objective:    Physical Exam  Constitutional: She is oriented to person, place, and time. She appears well-developed and well-nourished. No distress.  HENT:  Head: Normocephalic and atraumatic.  Eyes: Conjunctivae are normal.  Neck: Neck supple. No thyromegaly present.  Cardiovascular: Normal rate, regular rhythm and normal heart sounds.   No murmur heard. Pulmonary/Chest: Effort normal and breath sounds normal. No respiratory distress.  Abdominal: Soft. Bowel sounds are normal. She exhibits no distension and no mass. There is no tenderness.  Musculoskeletal: She exhibits no edema.  Lymphadenopathy:    She  has no cervical adenopathy.  Neurological: She is alert and oriented to person, place, and time.  Skin: Skin is warm and dry.  Psychiatric: She has a normal mood and affect. Her behavior is normal.    BP 110/62 mmHg  Pulse 73  Temp(Src) 98.2 F (36.8 C) (Oral)  Ht  (1.702 m)  Wt 182 lb (82.555 kg)  BMI 28.50 kg/m2  SpO2 97% Wt Readings from Last 3 Encounters:  04/22/16 182 lb (82.555 kg)  01/08/16 193 lb 2 oz (87.601 kg)  09/03/15 198 lb 6 oz (89.982 kg)     Lab Results  Component Value Date   WBC 4.5 01/08/2016   HGB 12.8 01/08/2016   HCT 38.4 01/08/2016   PLT 240.0 01/08/2016   GLUCOSE 82 03/05/2015   CHOL 154 03/05/2015   TRIG 87.0 03/05/2015   HDL 43.80 03/05/2015   LDLCALC 93 03/05/2015   ALT 19 03/05/2015   AST 16 03/05/2015   NA 138 03/05/2015   K 4.7 03/05/2015   CL 104 03/05/2015   CREATININE 0.86 03/05/2015   BUN 12 03/05/2015   CO2 28 03/05/2015   TSH 1.59 01/08/2016    Lab Results  Component Value Date   TSH 1.59 01/08/2016   Lab Results  Component Value Date   WBC 4.5 01/08/2016   HGB 12.8 01/08/2016   HCT 38.4 01/08/2016   MCV 89.5 01/08/2016   PLT 240.0 01/08/2016   Lab Results  Component Value Date   NA 138 03/05/2015   K 4.7 03/05/2015   CO2 28 03/05/2015   GLUCOSE 82 03/05/2015   BUN 12 03/05/2015   CREATININE 0.86 03/05/2015   BILITOT 0.5 03/05/2015   ALKPHOS 54 03/05/2015   AST 16 03/05/2015   ALT 19 03/05/2015   PROT 6.7 03/05/2015   ALBUMIN 4.0 03/05/2015   CALCIUM 8.7 03/05/2015   GFR 69.08 01/07/2013   Lab Results  Component Value Date   CHOL 154 03/05/2015   Lab Results  Component Value Date   HDL 43.80 03/05/2015   Lab Results  Component Value Date   LDLCALC 93 03/05/2015   Lab Results  Component Value Date   TRIG 87.0 03/05/2015   Lab Results  Component Value Date   CHOLHDL 4 03/05/2015   No results found for: HGBA1C     Assessment & Plan:   Problem List Items Addressed This Visit     Palpitations    No recent episodes      Obesity - Primary    Encouraged DASH diet, decrease po intake and increase exercise as tolerated. Needs 7-8 hours of sleep nightly. Avoid trans fats, eat small, frequent meals every 4-5 hours with lean proteins, complex carbs and healthy fats. Minimize simple carbs      GERD (gastroesophageal reflux disease)    Avoid offending foods, take probiotics. Do not eat large meals in  late evening and consider raising head of bed. Has had increased nausea this past week. No vomiting or anorexia. Try ginger prn and given Ondansetron to try infrequently      Relevant Medications   ondansetron (ZOFRAN) 4 MG tablet      I have changed Ms. Claus's ALPRAZolam. I am also having her start on ondansetron. Additionally, I am having her maintain her ranitidine, meloxicam, and FLUoxetine.  Meds ordered this encounter  Medications  . ALPRAZolam (XANAX) 0.25 MG tablet    Sig: Take 1 tablet (0.25 mg total) by mouth 2 (two) times daily as needed.    Dispense:  40 tablet    Refill:  2    This request is for a new prescription for a controlled substance as required by Federal/State law.  . ondansetron (ZOFRAN) 4 MG tablet    Sig: Take 1 tablet (4 mg total) by mouth every 8 (eight) hours as needed for nausea or vomiting.    Dispense:  20 tablet    Refill:  1     Danise EdgeBLYTH, STACEY, MD

## 2016-04-27 NOTE — Assessment & Plan Note (Signed)
No recent episodes

## 2016-04-27 NOTE — Assessment & Plan Note (Signed)
Avoid offending foods, take probiotics. Do not eat large meals in late evening and consider raising head of bed. Has had increased nausea this past week. No vomiting or anorexia. Try ginger prn and given Ondansetron to try infrequently

## 2016-04-27 NOTE — Assessment & Plan Note (Signed)
Encouraged DASH diet, decrease po intake and increase exercise as tolerated. Needs 7-8 hours of sleep nightly. Avoid trans fats, eat small, frequent meals every 4-5 hours with lean proteins, complex carbs and healthy fats. Minimize simple carbs 

## 2016-05-14 DIAGNOSIS — J019 Acute sinusitis, unspecified: Secondary | ICD-10-CM | POA: Diagnosis not present

## 2016-07-15 ENCOUNTER — Encounter: Payer: Self-pay | Admitting: Family Medicine

## 2016-10-08 ENCOUNTER — Other Ambulatory Visit: Payer: Self-pay | Admitting: Family Medicine

## 2016-11-05 DIAGNOSIS — M546 Pain in thoracic spine: Secondary | ICD-10-CM | POA: Diagnosis not present

## 2016-11-05 DIAGNOSIS — M25519 Pain in unspecified shoulder: Secondary | ICD-10-CM | POA: Diagnosis not present

## 2016-11-05 DIAGNOSIS — M542 Cervicalgia: Secondary | ICD-10-CM | POA: Diagnosis not present

## 2016-11-05 DIAGNOSIS — N62 Hypertrophy of breast: Secondary | ICD-10-CM | POA: Diagnosis not present

## 2016-11-07 DIAGNOSIS — Z6829 Body mass index (BMI) 29.0-29.9, adult: Secondary | ICD-10-CM | POA: Diagnosis not present

## 2016-11-07 DIAGNOSIS — Z01419 Encounter for gynecological examination (general) (routine) without abnormal findings: Secondary | ICD-10-CM | POA: Diagnosis not present

## 2016-11-12 ENCOUNTER — Encounter: Payer: Self-pay | Admitting: Family Medicine

## 2016-12-16 ENCOUNTER — Other Ambulatory Visit: Payer: Self-pay

## 2016-12-16 ENCOUNTER — Other Ambulatory Visit: Payer: Self-pay | Admitting: Family Medicine

## 2016-12-16 NOTE — Telephone Encounter (Signed)
Received refill request for Xanax 0.25mg  (take 1 tab po BID PRN); #40 )RF  Last Rf: 10/08/2016 Last Ov: 04/22/2016 Missed last scheduled appointment on 10/23/2016. UDS: None.  Please advise.

## 2016-12-16 NOTE — Telephone Encounter (Signed)
Faxed a hard copy to the following Pharmacy:  [id number] - [place],  - [address] [phone])

## 2016-12-25 IMAGING — DX DG ABDOMEN 2V
3 series · 3 of 3 positions shown · non-contrast
Comparison: 08/01/2013

CLINICAL DATA: Patient c/o LUQ pains x 6 weeks, hx of
cholecystectomy, tubal pregnancy, no other complaints

EXAM:
ABDOMEN - 2 VIEW

[abdomen erect]
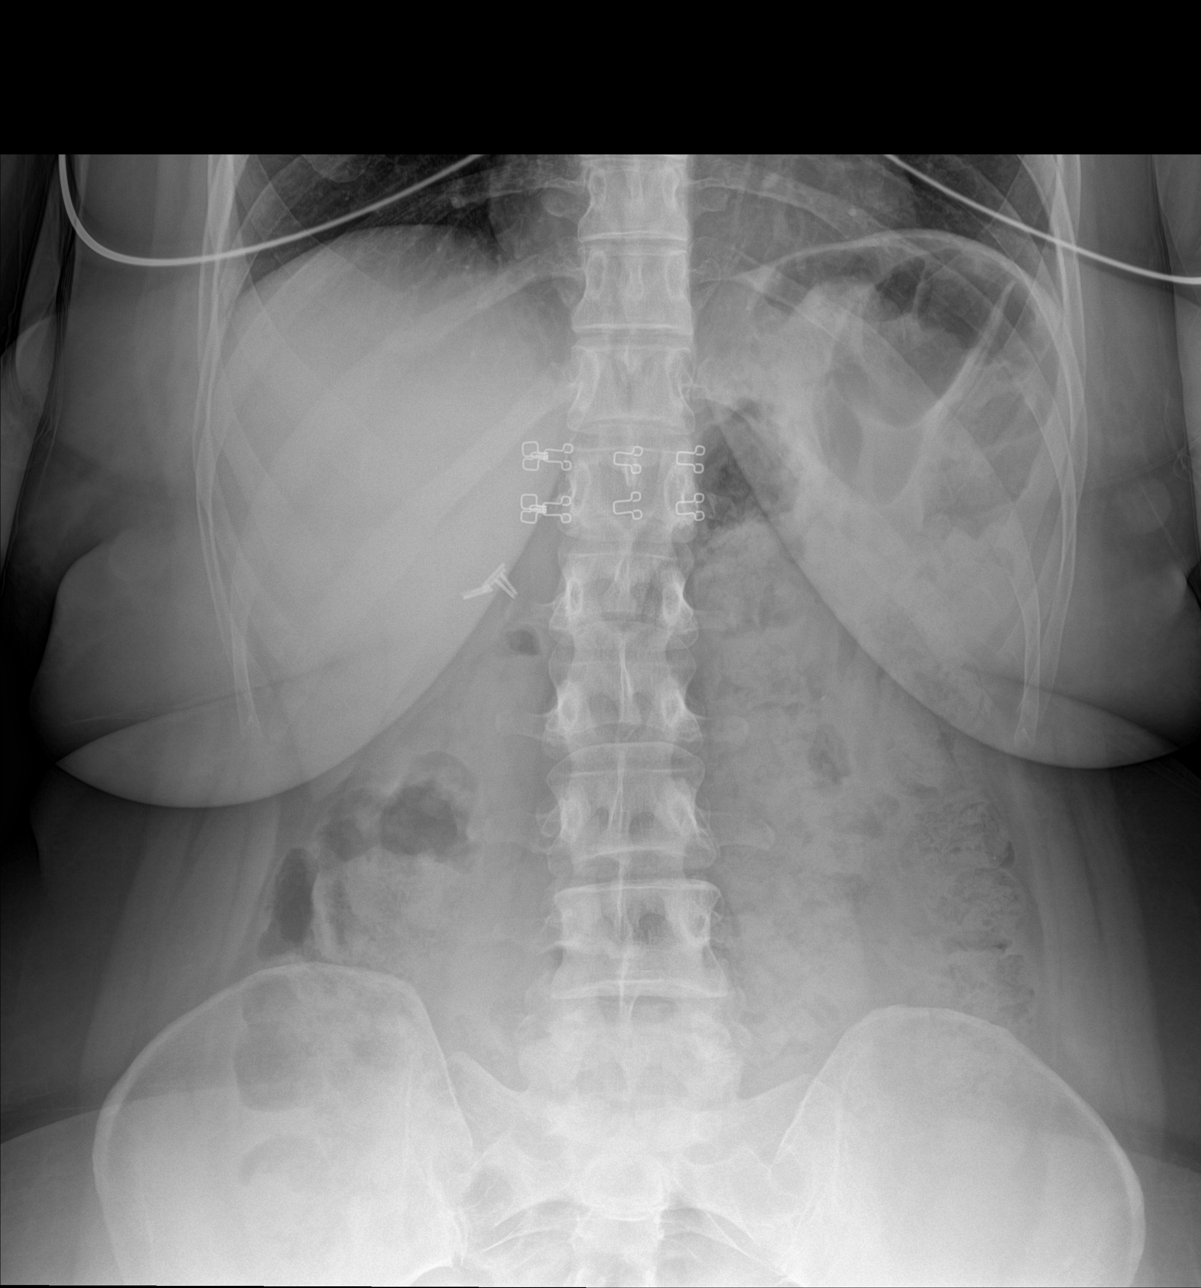

[abdomen supine (1 of 2)]
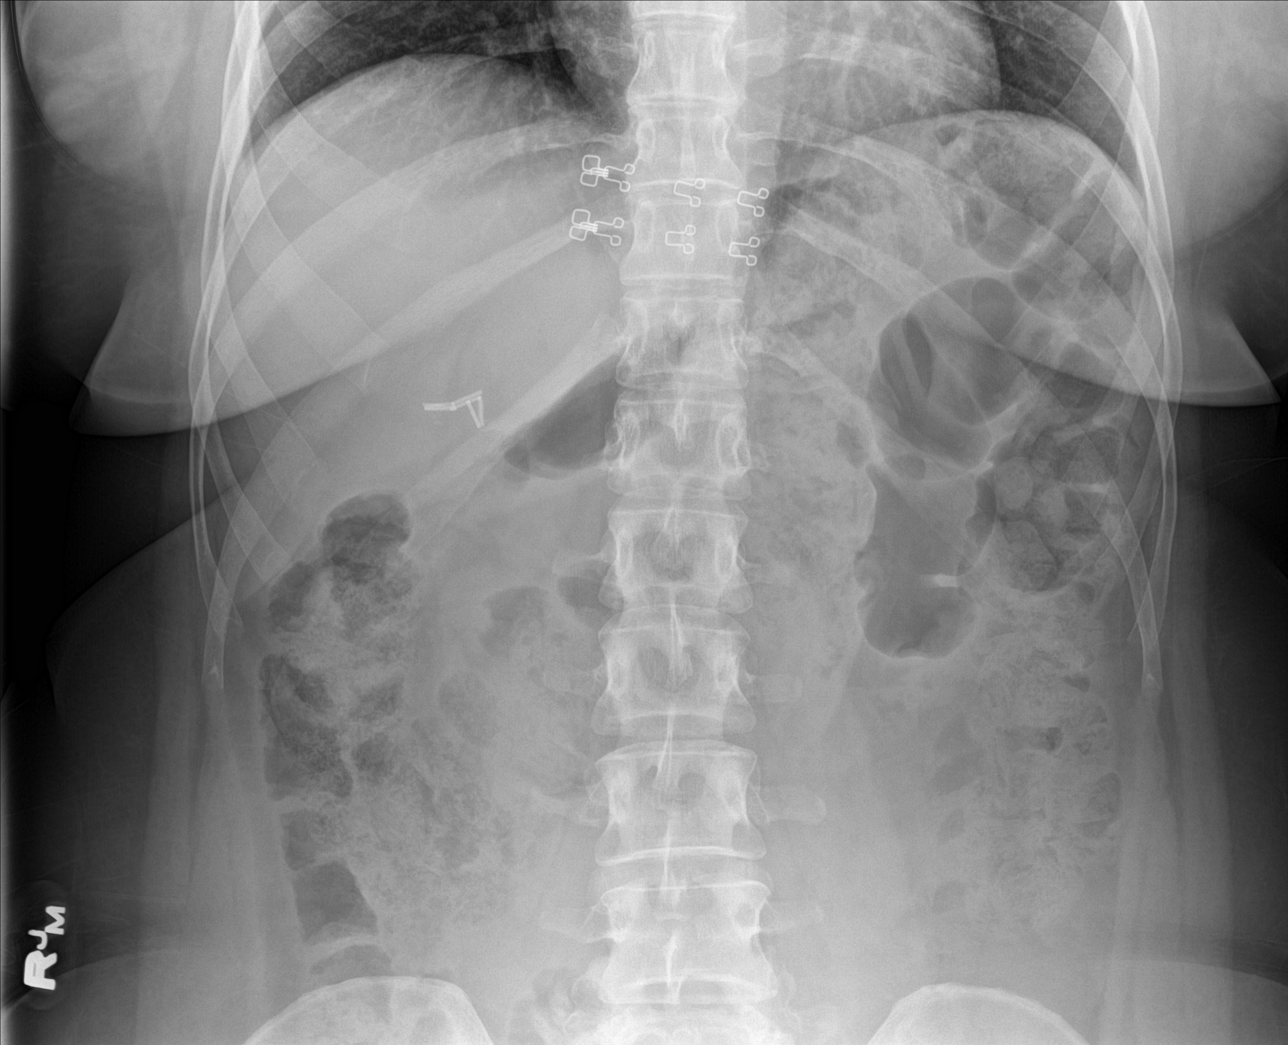

[abdomen supine (2 of 2)]
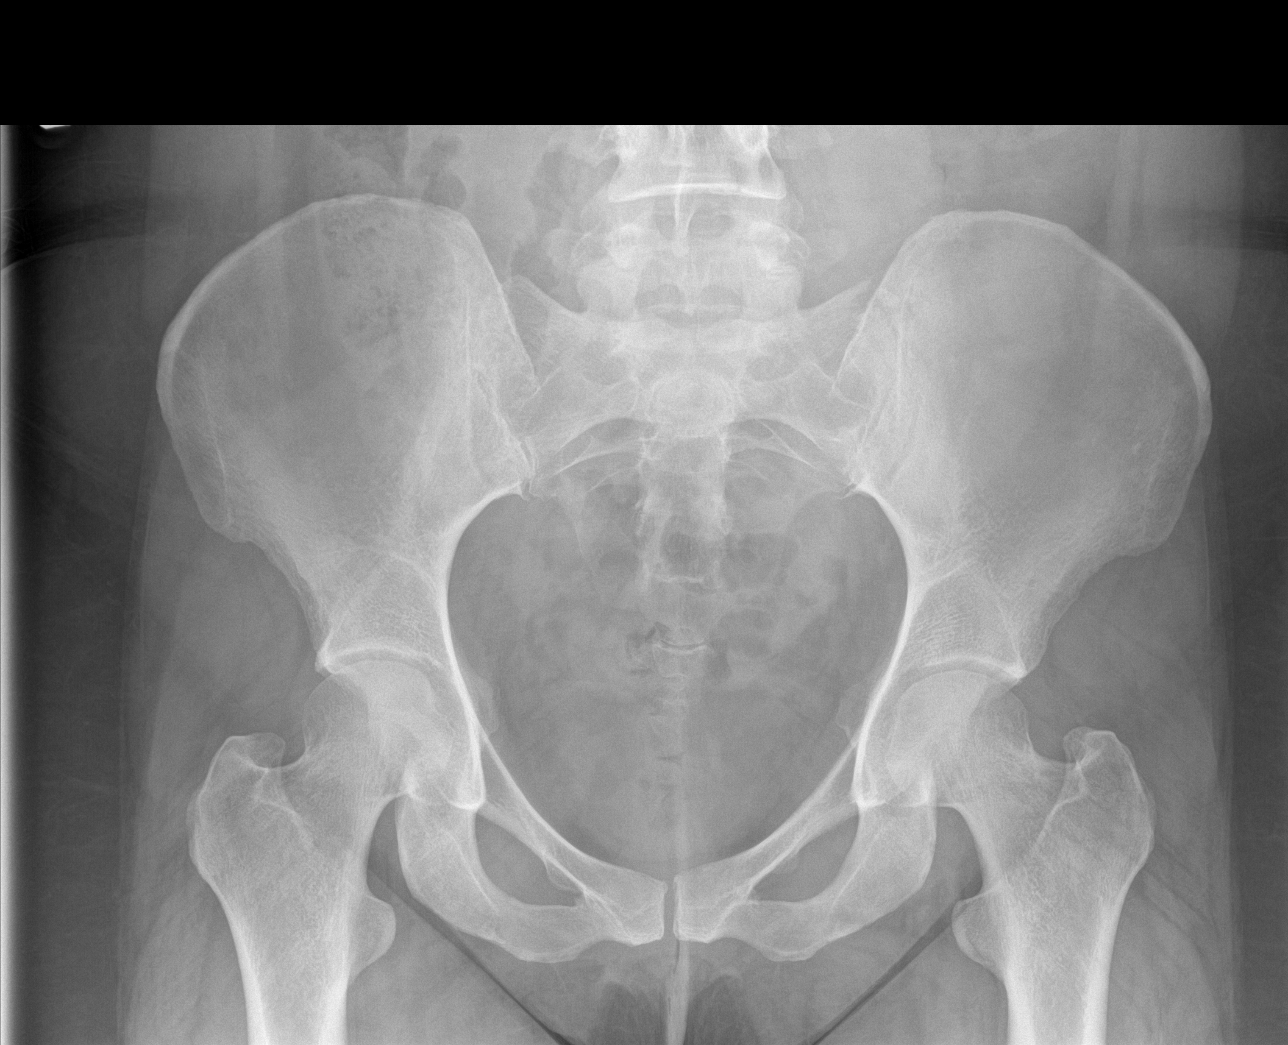

[3 of 3 positions shown; findings below may reference images not displayed]

FINDINGS: No bowel distention is seen to suggest obstruction or significant
adynamic ileus. There is moderate increased stool in the colon.

No free air. Status post cholecystectomy. No evidence of renal or
ureteral stones. Clear lung bases.
IMPRESSION: 1. No acute findings.
2. Moderate increased stool in the colon.

## 2017-01-04 ENCOUNTER — Other Ambulatory Visit: Payer: Self-pay | Admitting: Family Medicine

## 2017-01-05 NOTE — Telephone Encounter (Signed)
lvm advising patient to c/b and schedule

## 2017-01-05 NOTE — Telephone Encounter (Signed)
Please schedule patient for follow up visit.  Pc

## 2017-03-31 ENCOUNTER — Encounter: Payer: Self-pay | Admitting: Family Medicine

## 2017-03-31 ENCOUNTER — Ambulatory Visit (INDEPENDENT_AMBULATORY_CARE_PROVIDER_SITE_OTHER): Payer: BLUE CROSS/BLUE SHIELD | Admitting: Family Medicine

## 2017-03-31 VITALS — BP 108/64 | HR 62 | Temp 97.9°F | Wt 190.6 lb

## 2017-03-31 DIAGNOSIS — E6609 Other obesity due to excess calories: Secondary | ICD-10-CM | POA: Diagnosis not present

## 2017-03-31 DIAGNOSIS — E782 Mixed hyperlipidemia: Secondary | ICD-10-CM

## 2017-03-31 DIAGNOSIS — F419 Anxiety disorder, unspecified: Secondary | ICD-10-CM | POA: Diagnosis not present

## 2017-03-31 DIAGNOSIS — F329 Major depressive disorder, single episode, unspecified: Secondary | ICD-10-CM | POA: Diagnosis not present

## 2017-03-31 DIAGNOSIS — R002 Palpitations: Secondary | ICD-10-CM

## 2017-03-31 DIAGNOSIS — E785 Hyperlipidemia, unspecified: Secondary | ICD-10-CM | POA: Diagnosis not present

## 2017-03-31 DIAGNOSIS — R110 Nausea: Secondary | ICD-10-CM | POA: Diagnosis not present

## 2017-03-31 DIAGNOSIS — F32A Depression, unspecified: Secondary | ICD-10-CM

## 2017-03-31 MED ORDER — ALPRAZOLAM 0.25 MG PO TABS: ORAL_TABLET | ORAL | 1 refills | Status: DC

## 2017-03-31 MED ORDER — ESCITALOPRAM OXALATE 20 MG PO TABS: 20.0000 mg | ORAL_TABLET | Freq: Every day | ORAL | 2 refills | Status: DC

## 2017-03-31 NOTE — Progress Notes (Signed)
Patient ID: [name], female   DOB: May 25, 1971, 46 y.o.   MRN: [id number]   Subjective:  I acted as a Neurosurgeon for Danise Edge, MD [place], [place] Patient ID: [name], female    DOB: November 30, 1971, 46 y.o.   MRN: [id number]  Chief Complaint  Patient presents with  . Nausea    x1 month. States that medication is working some.    HPI  Patient is in today for an acute visit for nausea. Patient states that this has been a concern for the past month. Has been taking Zofran. Also wants to discuss possibly changing her Prozac medication to something else. States that the medication seems to be working now. Patient has no additional acute concerns noted at this time. She is tearful and anxious frequently. Endorses anhedonia but no suicidal ideation. No recent febrile illness and the nausea she had over this past month has resolved. Denies CP/palp/SOB/HA/congestion/fevers or GU c/o. Taking meds as prescribed  Patient Care Team: Bradd Canary, MD as PCP - General (Family Medicine)   Past Medical History:  Diagnosis Date  . Acute upper respiratory infections of unspecified site 08/21/2014  . Anxiety   . Anxiety and depression 10/27/2011  . Granuloma annulare    bx confirmed, improved  . Hx: UTI (urinary tract infection)    in childhood, resolved with urethral stretching  . Hyperlipidemia 04/01/2017  . Left flank pain 08/07/2013  . Obesity 09/03/2015  . Palpitations   . Preventative health care 08/26/2011  . Toe pain, right 04/07/2012  . URI (upper respiratory infection) 01/09/2013    Past Surgical History:  Procedure Laterality Date  . BUNIONECTOMY     left  . CESAREAN SECTION    . CHOLECYSTECTOMY    . TONSILLECTOMY    . TUBAL LIGATION    . URETHRAL STRICTURE DILATATION     childhood    Family History  Problem Relation Age of Onset  . Hypertension Mother   . Depression Father     suicide  . Hypertension Father   . COPD Father     smoker  . Alzheimer's disease  Maternal Grandmother   . Alzheimer's disease Maternal Grandfather   . Heart disease Paternal Grandfather     MI  . Hypertension Brother     Social History   Social History  . Marital status: Married    Spouse name: N/A  . Number of children: N/A  . Years of education: N/A   Occupational History  . Not on file.   Social History Main Topics  . Smoking status: Never Smoker  . Smokeless tobacco: Never Used  . Alcohol use No  . Drug use: No  . 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 Not on file   Social History Narrative  . No narrative on file    Outpatient Medications Prior to Visit  Medication Sig Dispense Refill  . ondansetron (ZOFRAN) 4 MG tablet Take 1 tablet (4 mg total) by mouth every 8 (eight) hours as needed for nausea or vomiting. 20 tablet 1  . ranitidine (ZANTAC) 150 MG tablet Take 1 tablet (150 mg total) by mouth 2 (two) times daily as needed for heartburn. 60 tablet 3  . ALPRAZolam (XANAX) 0.25 MG tablet TAKE 1 TABLET BY MOUTH 2 TIMES DAILY AS NEEDED 40 tablet 1  . FLUoxetine (PROZAC) 40 MG capsule TAKE  1 CAPSULE (40 MG TOTAL) BY MOUTH DAILY. (Patient not taking: Reported on 03/31/2017) 90 capsule 0  . meloxicam (MOBIC) 15 MG tablet   0   No facility-administered medications prior to visit.     Allergies  Allergen Reactions  . Contrast Media [Iodinated Diagnostic Agents]     Review of Systems  Constitutional: Negative for fever and malaise/fatigue.  HENT: Negative for congestion.   Eyes: Negative for blurred vision.  Respiratory: Negative for shortness of breath.   Cardiovascular: Negative for chest pain, palpitations and leg swelling.  Gastrointestinal: Positive for nausea. Negative for abdominal pain, blood in stool, heartburn and vomiting.  Genitourinary: Negative for dysuria and frequency.  Musculoskeletal: Negative for falls.  Skin: Negative for  rash.  Neurological: Negative for dizziness, loss of consciousness and headaches.  Endo/Heme/Allergies: Negative for environmental allergies.  Psychiatric/Behavioral: Negative for depression. The patient is not nervous/anxious.        Objective:    Physical Exam  Constitutional: She is oriented to person, place, and time. She appears well-developed and well-nourished. No distress.  HENT:  Head: Normocephalic and atraumatic.  Nose: Nose normal.  Eyes: Right eye exhibits no discharge. Left eye exhibits no discharge.  Neck: Normal range of motion. Neck supple.  Cardiovascular: Normal rate and regular rhythm.   No murmur heard. Pulmonary/Chest: Effort normal and breath sounds normal.  Abdominal: Soft. Bowel sounds are normal. There is no tenderness.  Musculoskeletal: She exhibits no edema.  Neurological: She is alert and oriented to person, place, and time.  Skin: Skin is warm and dry.  Psychiatric: She has a normal mood and affect.  Nursing note and vitals reviewed.   BP 108/64 (BP Location: Left Arm, Patient Position: Sitting, Cuff Size: Normal)   Pulse 62   Temp 97.9 F (36.6 C) (Oral)   Wt 190 lb 9.6 oz (86.5 kg)   SpO2 100% Comment: RA  BMI 29.85 kg/m  Wt Readings from Last 3 Encounters:  03/31/17 190 lb 9.6 oz (86.5 kg)  04/22/16 182 lb (82.6 kg)  01/08/16 193 lb 2 oz (87.6 kg)   BP Readings from Last 3 Encounters:  03/31/17 108/64  04/22/16 110/62  01/08/16 120/72     Immunization History  Administered Date(s) Administered  . Influenza Split 08/26/2011  . Influenza,inj,Quad PF,36+ Mos 08/21/2014, 09/03/2015  . PPD Test 01/05/2013  . Tdap 08/26/2011    Health Maintenance  Topic Date Due  . INFLUENZA VACCINE  07/08/2017  . PAP SMEAR  10/30/2017  . TETANUS/TDAP  08/25/2021  . HIV Screening  Completed    Lab Results  Component Value Date   WBC 4.5 01/08/2016   HGB 12.8 01/08/2016   HCT 38.4 01/08/2016   PLT 240.0 01/08/2016   GLUCOSE 82 03/05/2015    CHOL 154 03/05/2015   TRIG 87.0 03/05/2015   HDL 43.80 03/05/2015   LDLCALC 93 03/05/2015   ALT 19 03/05/2015   AST 16 03/05/2015   NA 138 03/05/2015   K 4.7 03/05/2015   CL 104 03/05/2015   CREATININE 0.86 03/05/2015   BUN 12 03/05/2015   CO2 28 03/05/2015   TSH 1.59 01/08/2016    Lab Results  Component Value Date   TSH 1.59 01/08/2016   Lab Results  Component Value Date   WBC 4.5 01/08/2016   HGB 12.8 01/08/2016   HCT 38.4 01/08/2016   MCV 89.5 01/08/2016   PLT 240.0 01/08/2016   Lab Results  Component Value Date   NA 138 03/05/2015  K 4.7 03/05/2015   CO2 28 03/05/2015   GLUCOSE 82 03/05/2015   BUN 12 03/05/2015   CREATININE 0.86 03/05/2015   BILITOT 0.5 03/05/2015   ALKPHOS 54 03/05/2015   AST 16 03/05/2015   ALT 19 03/05/2015   PROT 6.7 03/05/2015   ALBUMIN 4.0 03/05/2015   CALCIUM 8.7 03/05/2015   GFR 69.08 01/07/2013   Lab Results  Component Value Date   CHOL 154 03/05/2015   Lab Results  Component Value Date   HDL 43.80 03/05/2015   Lab Results  Component Value Date   LDLCALC 93 03/05/2015   Lab Results  Component Value Date   TRIG 87.0 03/05/2015   Lab Results  Component Value Date   CHOLHDL 4 03/05/2015   No results found for: HGBA1C       Assessment & Plan:   Problem List Items Addressed This Visit    Palpitations    Infrequent, short lived. Minimize caffeine and report worsening symptoms      Relevant Orders   CBC   Comprehensive metabolic panel   Lipid panel   TSH   Anxiety and depression    Does not feel the Prozac has worked as well recently, she is experiencing increased anxiety and crying episodes. Will try to d/c Prozac and start Lexapro 20 mg daily, refill given on alprazolam to use sparingly      Obesity    Encouraged DASH diet, decrease po intake and increase exercise as tolerated. Needs 7-8 hours of sleep nightly. Avoid trans fats, eat small, frequent meals every 4-5 hours with lean proteins, complex carbs  and healthy fats. Minimize siimple carbs      Hyperlipidemia - Primary    Tolerating statin, encouraged heart healthy diet, avoid trans fats, minimize simple carbs and saturated fats. Increase exercise as tolerated      Relevant Orders   CBC   Comprehensive metabolic panel   Lipid panel   TSH    Other Visit Diagnoses    Nausea       Relevant Orders   CBC   Comprehensive metabolic panel   Lipid panel   TSH      I have discontinued Ms. Nease's meloxicam and FLUoxetine. I am also having her start on escitalopram. Additionally, I am having her maintain her ranitidine, ondansetron, and ALPRAZolam.  Meds ordered this encounter  Medications  . escitalopram (LEXAPRO) 20 MG tablet    Sig: Take 1 tablet (20 mg total) by mouth daily.    Dispense:  30 tablet    Refill:  2  . ALPRAZolam (XANAX) 0.25 MG tablet    Sig: TAKE 1 TABLET BY MOUTH 2 TIMES DAILY AS NEEDED    Dispense:  40 tablet    Refill:  1    This request is for a new prescription for a controlled substance as required by Federal/State law.    CMA served as Neurosurgeon during this visit. History, Physical and Plan performed by medical provider. Documentation and orders reviewed and attested to.  Danise Edge, MD

## 2017-03-31 NOTE — Progress Notes (Signed)
Pre visit review using our clinic review tool, if applicable. No additional management support is needed unless otherwise documented below in the visit note. 

## 2017-03-31 NOTE — Patient Instructions (Signed)
Gastritis, Adult Gastritis is swelling (inflammation) of the stomach. When you have this condition, you can have these problems (symptoms):  Pain in your stomach.  A burning feeling in your stomach.  Feeling sick to your stomach (nauseous).  Throwing up (vomiting).  Feeling too full after you eat. It is important to get help for this condition. Without help, your stomach can bleed, and you can get sores (ulcers) in your stomach. Follow these instructions at home:  Take over-the-counter and prescription medicines only as told by your doctor.  If you were prescribed an antibiotic medicine, take it as told by your doctor. Do not stop taking it even if you start to feel better.  Drink enough fluid to keep your pee (urine) clear or pale yellow.  Instead of eating big meals, eat small meals often. Contact a health care provider if:  Your problems get worse.  Your problems go away and then come back. Get help right away if:  You throw up blood or something that looks like coffee grounds.  You have black or dark red poop (stools).  You cannot keep fluids down.  Your stomach pain gets worse.  You have a fever.  You do not feel better after 1 week. This information is not intended to replace advice given to you by your health care provider. Make sure you discuss any questions you have with your health care provider. Document Released: 05/12/2008 Document Revised: 07/23/2016 Document Reviewed: 08/18/2015 Elsevier Interactive Patient Education  2017 Elsevier Inc.  

## 2017-04-01 ENCOUNTER — Encounter: Payer: Self-pay | Admitting: Family Medicine

## 2017-04-01 DIAGNOSIS — E785 Hyperlipidemia, unspecified: Secondary | ICD-10-CM

## 2017-04-01 HISTORY — DX: Hyperlipidemia, unspecified: E78.5

## 2017-04-01 LAB — COMPREHENSIVE METABOLIC PANEL
ALK PHOS: 50 U/L (ref 39–117)
ALT: 12 U/L (ref 0–35)
AST: 17 U/L (ref 0–37)
Albumin: 4.2 g/dL (ref 3.5–5.2)
BILIRUBIN TOTAL: 0.4 mg/dL (ref 0.2–1.2)
BUN: 15 mg/dL (ref 6–23)
CALCIUM: 9.4 mg/dL (ref 8.4–10.5)
CO2: 29 meq/L (ref 19–32)
CREATININE: 0.92 mg/dL (ref 0.40–1.20)
Chloride: 101 mEq/L (ref 96–112)
GFR: 69.48 mL/min (ref >60.00)
GLUCOSE: 65 mg/dL — AB (ref 70–99)
Potassium: 4 mEq/L (ref 3.5–5.1)
Sodium: 137 mEq/L (ref 135–145)
TOTAL PROTEIN: 7.2 g/dL (ref 6.0–8.3)

## 2017-04-01 LAB — LIPID PANEL
CHOL/HDL RATIO: 3
Cholesterol: 164 mg/dL (ref 0–200)
HDL: 60 mg/dL (ref >39.00)
LDL Cholesterol: 86 mg/dL (ref 0–99)
NONHDL: 104.39
Triglycerides: 93 mg/dL (ref 0.0–149.0)
VLDL: 18.6 mg/dL (ref 0.0–40.0)

## 2017-04-01 LAB — TSH: TSH: 3.03 u[IU]/mL (ref 0.35–4.50)

## 2017-04-01 LAB — CBC
HCT: 39 % (ref 36.0–46.0)
HEMOGLOBIN: 12.9 g/dL (ref 12.0–15.0)
MCHC: 33.2 g/dL (ref 30.0–36.0)
MCV: 89.9 fl (ref 78.0–100.0)
PLATELETS: 253 10*3/uL (ref 150.0–400.0)
RBC: 4.33 Mil/uL (ref 3.87–5.11)
RDW: 13.1 % (ref 11.5–15.5)
WBC: 5.6 10*3/uL (ref 4.0–10.5)

## 2017-04-01 NOTE — Assessment & Plan Note (Signed)
Encouraged DASH diet, decrease po intake and increase exercise as tolerated. Needs 7-8 hours of sleep nightly. Avoid trans fats, eat small, frequent meals every 4-5 hours with lean proteins, complex carbs and healthy fats. Minimize siimple carbs

## 2017-04-01 NOTE — Assessment & Plan Note (Signed)
Tolerating statin, encouraged heart healthy diet, avoid trans fats, minimize simple carbs and saturated fats. Increase exercise as tolerated 

## 2017-04-01 NOTE — Assessment & Plan Note (Signed)
Infrequent, short lived. Minimize caffeine and report worsening symptoms

## 2017-04-01 NOTE — Assessment & Plan Note (Signed)
Does not feel the Prozac has worked as well recently, she is experiencing increased anxiety and crying episodes. Will try to d/c Prozac and start Lexapro 20 mg daily, refill given on alprazolam to use sparingly

## 2017-04-21 DIAGNOSIS — Z1231 Encounter for screening mammogram for malignant neoplasm of breast: Secondary | ICD-10-CM | POA: Diagnosis not present

## 2017-05-05 DIAGNOSIS — H40011 Open angle with borderline findings, low risk, right eye: Secondary | ICD-10-CM | POA: Diagnosis not present

## 2017-06-09 ENCOUNTER — Ambulatory Visit: Payer: BLUE CROSS/BLUE SHIELD | Admitting: Family Medicine

## 2017-06-22 ENCOUNTER — Ambulatory Visit (INDEPENDENT_AMBULATORY_CARE_PROVIDER_SITE_OTHER): Payer: BLUE CROSS/BLUE SHIELD | Admitting: Family Medicine

## 2017-06-22 ENCOUNTER — Encounter: Payer: Self-pay | Admitting: Family Medicine

## 2017-06-22 DIAGNOSIS — K219 Gastro-esophageal reflux disease without esophagitis: Secondary | ICD-10-CM

## 2017-06-22 DIAGNOSIS — E6609 Other obesity due to excess calories: Secondary | ICD-10-CM | POA: Diagnosis not present

## 2017-06-22 DIAGNOSIS — F32A Depression, unspecified: Secondary | ICD-10-CM

## 2017-06-22 DIAGNOSIS — F419 Anxiety disorder, unspecified: Secondary | ICD-10-CM

## 2017-06-22 DIAGNOSIS — F329 Major depressive disorder, single episode, unspecified: Secondary | ICD-10-CM

## 2017-06-22 DIAGNOSIS — G43909 Migraine, unspecified, not intractable, without status migrainosus: Secondary | ICD-10-CM

## 2017-06-22 HISTORY — DX: Migraine, unspecified, not intractable, without status migrainosus: G43.909

## 2017-06-22 MED ORDER — ESCITALOPRAM OXALATE 20 MG PO TABS: 20.0000 mg | ORAL_TABLET | Freq: Every day | ORAL | 1 refills | Status: DC

## 2017-06-22 NOTE — Assessment & Plan Note (Signed)
Encouraged increased hydration, 64 ounces of clear fluids daily. Minimize alcohol and caffeine. Eat small frequent meals with lean proteins and complex carbs. Avoid high and low blood sugars. Get adequate sleep, 7-8 hours a night. Needs exercise daily preferably in the morning. Try Excedrin tension vs migraine prn

## 2017-06-22 NOTE — Assessment & Plan Note (Signed)
lexapro is helping, no changes

## 2017-06-22 NOTE — Progress Notes (Signed)
Subjective:  I acted as a Neurosurgeon for Dr. Abner Greenspan. [place], [place]  Patient ID: [name], female    DOB: October 12, 1971, 46 y.o.   MRN: [id number]  No chief complaint on file.   HPI  Patient is in today for medication follow up. Patient needs refill on her Lexapro. She is pleased with the response to the Lexapro at 20 mg daily. Depression is improved anhedonia is improved and anxiety is improved. Unfortunately she has recently noted an increase in headaches. When she was a young woman she had some trouble with migraines but those have largely resolved. Over the last couple of weeks she has had increased headaches with some nausea and photophobia noted. She took some Excedrin Migraine with good results over the weekend. No headache at today's visit no recent trauma falls or febrile illness. No other acute concerns noted at today's visit although she is frustrated with weight gain. Denies CP/palp/SOB/HA/congestion/fevers/GI or GU c/o. Taking meds as prescribed  Patient Care Team: Bradd Canary, MD as PCP - General (Family Medicine)   Past Medical History:  Diagnosis Date  . Acute upper respiratory infections of unspecified site 08/21/2014  . Anxiety   . Anxiety and depression 10/27/2011  . Granuloma annulare    bx confirmed, improved  . Hx: UTI (urinary tract infection)    in childhood, resolved with urethral stretching  . Hyperlipidemia 04/01/2017  . Left flank pain 08/07/2013  . Migraine 06/22/2017  . Obesity 09/03/2015  . Palpitations   . Preventative health care 08/26/2011  . Toe pain, right 04/07/2012  . URI (upper respiratory infection) 01/09/2013    Past Surgical History:  Procedure Laterality Date  . BUNIONECTOMY     left  . CESAREAN SECTION    . CHOLECYSTECTOMY    . TONSILLECTOMY    . TUBAL LIGATION    . URETHRAL STRICTURE DILATATION     childhood    Family History  Problem Relation Age of Onset  . Hypertension Mother   . Depression Father        suicide  .  Hypertension Father   . COPD Father        smoker  . Alzheimer's disease Maternal Grandmother   . Alzheimer's disease Maternal Grandfather   . Heart disease Paternal Grandfather        MI  . Hypertension Brother     Social History   Social History  . Marital status: Married    Spouse name: N/A  . Number of children: N/A  . Years of education: N/A   Occupational History  . Not on file.   Social History Main Topics  . Smoking status: Never Smoker  . Smokeless tobacco: Never Used  . Alcohol use No  . Drug use: No  . 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 Not on file   Social History Narrative  . No narrative on file    Outpatient Medications Prior to Visit  Medication Sig Dispense Refill  . ALPRAZolam (XANAX) 0.25 MG tablet TAKE 1 TABLET BY MOUTH 2 TIMES DAILY AS NEEDED 40 tablet 1  . ondansetron (ZOFRAN) 4 MG tablet Take 1 tablet (4 mg total) by mouth every 8 (eight) hours as needed for nausea or vomiting. 20 tablet 1  . ranitidine (ZANTAC) 150 MG tablet Take 1 tablet (150 mg total) by mouth 2 (two) times daily as  needed for heartburn. 60 tablet 3  . escitalopram (LEXAPRO) 20 MG tablet Take 1 tablet (20 mg total) by mouth daily. 30 tablet 2   No facility-administered medications prior to visit.     Allergies  Allergen Reactions  . Contrast Media [Iodinated Diagnostic Agents]     Review of Systems  Constitutional: Positive for malaise/fatigue. Negative for fever.  HENT: Negative for congestion.   Eyes: Negative for blurred vision.  Respiratory: Negative for cough and shortness of breath.   Cardiovascular: Negative for chest pain, palpitations and leg swelling.  Gastrointestinal: Negative for vomiting.  Musculoskeletal: Negative for back pain.  Skin: Negative for rash.  Neurological: Positive for headaches. Negative for loss of consciousness.    Psychiatric/Behavioral: Positive for depression. The patient is not nervous/anxious.        Objective:    Physical Exam  Constitutional: She is oriented to person, place, and time. She appears well-developed and well-nourished. No distress.  HENT:  Head: Normocephalic and atraumatic.  Eyes: Conjunctivae are normal.  Neck: Normal range of motion. No thyromegaly present.  Cardiovascular: Normal rate and regular rhythm.   Pulmonary/Chest: Effort normal and breath sounds normal. She has no wheezes.  Abdominal: Soft. Bowel sounds are normal. There is no tenderness.  Musculoskeletal: Normal range of motion. She exhibits no edema or deformity.  Neurological: She is alert and oriented to person, place, and time.  Skin: Skin is warm and dry. She is not diaphoretic.  Psychiatric: She has a normal mood and affect.    BP 109/61 (BP Location: Left Arm, Patient Position: Sitting, Cuff Size: Normal)   Pulse 68   Temp 98.4 F (36.9 C) (Oral)   Resp 18   Wt 198 lb (89.8 kg)   SpO2 98%   BMI 31.01 kg/m  Wt Readings from Last 3 Encounters:  06/22/17 198 lb (89.8 kg)  03/31/17 190 lb 9.6 oz (86.5 kg)  04/22/16 182 lb (82.6 kg)   BP Readings from Last 3 Encounters:  06/22/17 109/61  03/31/17 108/64  04/22/16 110/62     Immunization History  Administered Date(s) Administered  . Influenza Split 08/26/2011  . Influenza,inj,Quad PF,36+ Mos 08/21/2014, 09/03/2015  . PPD Test 01/05/2013  . Tdap 08/26/2011    Health Maintenance  Topic Date Due  . INFLUENZA VACCINE  07/08/2017  . PAP SMEAR  10/30/2017  . TETANUS/TDAP  08/25/2021  . HIV Screening  Completed    Lab Results  Component Value Date   WBC 5.6 03/31/2017   HGB 12.9 03/31/2017   HCT 39.0 03/31/2017   PLT 253.0 03/31/2017   GLUCOSE 65 (L) 03/31/2017   CHOL 164 03/31/2017   TRIG 93.0 03/31/2017   HDL 60.00 03/31/2017   LDLCALC 86 03/31/2017   ALT 12 03/31/2017   AST 17 03/31/2017   NA 137 03/31/2017   K 4.0 03/31/2017    CL 101 03/31/2017   CREATININE 0.92 03/31/2017   BUN 15 03/31/2017   CO2 29 03/31/2017   TSH 3.03 03/31/2017    Lab Results  Component Value Date   TSH 3.03 03/31/2017   Lab Results  Component Value Date   WBC 5.6 03/31/2017   HGB 12.9 03/31/2017   HCT 39.0 03/31/2017   MCV 89.9 03/31/2017   PLT 253.0 03/31/2017   Lab Results  Component Value Date   NA 137 03/31/2017   K 4.0 03/31/2017   CO2 29 03/31/2017   GLUCOSE 65 (L) 03/31/2017   BUN 15 03/31/2017   CREATININE 0.92 03/31/2017  BILITOT 0.4 03/31/2017   ALKPHOS 50 03/31/2017   AST 17 03/31/2017   ALT 12 03/31/2017   PROT 7.2 03/31/2017   ALBUMIN 4.2 03/31/2017   CALCIUM 9.4 03/31/2017   GFR 69.48 03/31/2017   Lab Results  Component Value Date   CHOL 164 03/31/2017   Lab Results  Component Value Date   HDL 60.00 03/31/2017   Lab Results  Component Value Date   LDLCALC 86 03/31/2017   Lab Results  Component Value Date   TRIG 93.0 03/31/2017   Lab Results  Component Value Date   CHOLHDL 3 03/31/2017   No results found for: HGBA1C       Assessment & Plan:   Problem List Items Addressed This Visit    Anxiety and depression    lexapro is helping, no changes      GERD (gastroesophageal reflux disease)    Avoid offending foods, start probiotics. Do not eat large meals in late evening and consider raising head of bed. Doing better with diet changes      Obesity    Encouraged DASH diet, decrease po intake and increase exercise as tolerated. Needs 7-8 hours of sleep nightly. Avoid trans fats, eat small, frequent meals every 4-5 hours with lean proteins, complex carbs and healthy fats. Minimize simple carbs, bariatric referral.      Migraine    Encouraged increased hydration, 64 ounces of clear fluids daily. Minimize alcohol and caffeine. Eat small frequent meals with lean proteins and complex carbs. Avoid high and low blood sugars. Get adequate sleep, 7-8 hours a night. Needs exercise daily  preferably in the morning. Try Excedrin tension vs migraine prn      Relevant Medications   escitalopram (LEXAPRO) 20 MG tablet      I am having Ms. Morrell maintain her ranitidine, ondansetron, ALPRAZolam, and escitalopram.  Meds ordered this encounter  Medications  . escitalopram (LEXAPRO) 20 MG tablet    Sig: Take 1 tablet (20 mg total) by mouth daily.    Dispense:  90 tablet    Refill:  1    CMA served as Neurosurgeonscribe during this visit. History, Physical and Plan performed by medical provider. Documentation and orders reviewed and attested to.  Danise EdgeStacey Sanaiyah Kirchhoff, MD

## 2017-06-22 NOTE — Patient Instructions (Signed)
Exercising to Lose Weight Exercising can help you to lose weight. In order to lose weight through exercise, you need to do vigorous-intensity exercise. You can tell that you are exercising with vigorous intensity if you are breathing very hard and fast and cannot hold a conversation while exercising. Moderate-intensity exercise helps to maintain your current weight. You can tell that you are exercising at a moderate level if you have a higher heart rate and faster breathing, but you are still able to hold a conversation. How often should I exercise? Choose an activity that you enjoy and set realistic goals. Your health care provider can help you to make an activity plan that works for you. Exercise regularly as directed by your health care provider. This may include:  Doing resistance training twice each week, such as: ? Push-ups. ? Sit-ups. ? Lifting weights. ? Using resistance bands.  Doing a given intensity of exercise for a given amount of time. Choose from these options: ? 150 minutes of moderate-intensity exercise every week. ? 75 minutes of vigorous-intensity exercise every week. ? A mix of moderate-intensity and vigorous-intensity exercise every week.  Children, pregnant women, people who are out of shape, people who are overweight, and older adults may need to consult a health care provider for individual recommendations. If you have any sort of medical condition, be sure to consult your health care provider before starting a new exercise program. What are some activities that can help me to lose weight?  Walking at a rate of at least 4.5 miles an hour.  Jogging or running at a rate of 5 miles per hour.  Biking at a rate of at least 10 miles per hour.  Lap swimming.  Roller-skating or in-line skating.  Cross-country skiing.  Vigorous competitive sports, such as football, basketball, and soccer.  Jumping rope.  Aerobic dancing. How can I be more active in my day-to-day  activities?  Use the stairs instead of the elevator.  Take a walk during your lunch break.  If you drive, park your car farther away from work or school.  If you take public transportation, get off one stop early and walk the rest of the way.  Make all of your phone calls while standing up and walking around.  Get up, stretch, and walk around every 30 minutes throughout the day. What guidelines should I follow while exercising?  Do not exercise so much that you hurt yourself, feel dizzy, or get very short of breath.  Consult your health care provider prior to starting a new exercise program.  Wear comfortable clothes and shoes with good support.  Drink plenty of water while you exercise to prevent dehydration or heat stroke. Body water is lost during exercise and must be replaced.  Work out until you breathe faster and your heart beats faster. This information is not intended to replace advice given to you by your health care provider. Make sure you discuss any questions you have with your health care provider. Document Released: 12/27/2010 Document Revised: 05/01/2016 Document Reviewed: 04/27/2014 Elsevier Interactive Patient Education  2018 Elsevier Inc.  

## 2017-06-22 NOTE — Assessment & Plan Note (Signed)
Encouraged DASH diet, decrease po intake and increase exercise as tolerated. Needs 7-8 hours of sleep nightly. Avoid trans fats, eat small, frequent meals every 4-5 hours with lean proteins, complex carbs and healthy fats. Minimize simple carbs, bariatric referral 

## 2017-06-22 NOTE — Assessment & Plan Note (Signed)
Avoid offending foods, start probiotics. Do not eat large meals in late evening and consider raising head of bed. Doing better with diet changes

## 2017-09-07 NOTE — Progress Notes (Signed)
[facility] [address] [place], [place] [address] Phone: [phone] Subjective:    I'm seeing this patient by the request  of:  Bradd Canary, MD   CC: Left knee pain   BJY:NWGNFAOZHY  [name] is a 46 y.o. female coming in with complaint of left knee pain. Patient states that she is experiencing some pain in her left knee cap. [place] the [place] she felt a lot of pain. 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   Onset- 3 months Location- knee cap/ patella  Duration- Gradual pain Character- sharp pain Aggravating factors- being on her feet a lot  Reliving factors- Advil- Helps some Therapies tried-  Severity- At its worse 7/10. Denies any radiation down the legs any numbness or tingling. Can be associated with oh is swelling     Past Medical History:  Diagnosis Date  . Acute upper respiratory infections of unspecified site 08/21/2014  . Anxiety   . Anxiety and depression 10/27/2011  . Granuloma annulare    bx confirmed, improved  . Hx: UTI (urinary tract infection)    in childhood, resolved with urethral stretching  . Hyperlipidemia 04/01/2017  . Left flank pain 08/07/2013  . Migraine 06/22/2017  . Obesity 09/03/2015  . Palpitations   . Preventative health care 08/26/2011  . Toe pain, right 04/07/2012  . URI (upper respiratory infection) 01/09/2013   Past Surgical History:  Procedure Laterality Date  . BUNIONECTOMY     left  . CESAREAN SECTION    . CHOLECYSTECTOMY    . TONSILLECTOMY    . TUBAL LIGATION    . URETHRAL STRICTURE DILATATION     childhood   Social History   Social History  . Marital status: Married    Spouse name: N/A  . Number of children: N/A  . Years of education: N/A   Social History Main Topics  . Smoking status: Never Smoker  . Smokeless tobacco: Never Used  . Alcohol use No  . Drug use: No  . Sexual activity: Yes     Comment: lives with husband and twins. 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 no  dietary restrictions   Other Topics Concern  . Not on file   Social History Narrative  . No narrative on file   Allergies  Allergen Reactions  . Contrast Media [Iodinated Diagnostic Agents]    Family History  Problem Relation Age of Onset  . Hypertension Mother   . Depression Father        suicide  . Hypertension Father   . COPD Father        smoker  . Alzheimer's disease Maternal Grandmother   . Alzheimer's disease Maternal Grandfather   . Heart disease Paternal Grandfather        MI  . Hypertension Brother      Past medical history, social, surgical and family history all reviewed in electronic medical record.  No pertanent information unless stated regarding to the chief complaint.   Review of Systems:Review of systems updated and as accurate as of 09/07/17  No headache, visual changes, nausea, vomiting, diarrhea, constipation, dizziness, abdominal pain, skin rash, fevers, chills, night sweats, weight loss, swollen lymph nodes,  chest pain, shortness of breath, mood changes. Positive muscle aches and joint swelling  Objective  There were no vitals taken for this visit. Systems examined below as of 09/07/17   General: No apparent distress alert and oriented x3 mood and affect  normal, dressed appropriately.  HEENT: Pupils equal, extraocular movements intact  Respiratory: Patient's speak in full sentences and does not appear short of breath  Cardiovascular: No lower extremity edema, non tender, no erythema  Skin: Warm dry intact with no signs of infection or rash on extremities or on axial skeleton.  Abdomen: Soft nontender  Neuro: Cranial nerves II through XII are intact, neurovascularly intact in all extremities with 2+ DTRs and 2+ pulses.  Lymph: No lymphadenopathy of posterior or anterior cervical chain or axillae bilaterally.  Gait normal with good balance and coordination.  MSK:  Non tender with full range of motion and good stability and symmetric strength and  tone of shoulders, elbows, wrist, hip, and ankles bilaterally.  Knee:Left Mild lateral tilt of the patella Tender over the anterior and superior lateral aspects of any Range of motion lacks last 5 of flexion Ligaments with solid consistent endpoints including ACL, PCL, LCL, MCL. Negative Mcmurray's, Apley's, and Thessalonian tests. painful patellar compression. Patellar glide with moderate to severe crepitus. Patellar and quadriceps tendons unremarkable. Hamstring and quadriceps strength is normal.  Contralateral knee unremarkable  MSK US performed of: Left knee  This study was ordered, performed, and interpreted by Terrilee Files D.O.  Knee: All structures visualized. Patient does have some mild to moderate narrowing of the medial compartment as well as moderate narrowing of the patellofemoral joint. Patellar Tendon unremarkable on long and transverse views without effusion. No abnormality of prepatellar bursa. LCL and MCL unremarkable on long and transverse views. No abnormality of origin of medial or lateral head of the gastrocnemius.  IMPRESSION:  Patellofemoral arthritis  After informed written and verbal consent, patient was seated on exam table. Left knee was prepped with alcohol swab and utilizing anterolateral approach, patient's left knee space was injected with 4:1  marcaine 0.5%: Kenalog /dL. Patient tolerated the procedure well without immediate complications.  Procedure 97110; 15 additional minutes spent for Therapeutic exercises as stated in above notes.  This included exercises focusing on stretching, strengthening, with significant focus on eccentric aspects.   Long term goals include an improvement in range of motion, strength, endurance as well as avoiding reinjury. Patient's frequency would include in 1-2 times a day, 3-5 times a week for a duration of 6-12 weeks.  Patellofemoral Syndrome  Reviewed anatomy using anatomical model and how PFS occurs.  Given rehab  exercises handout for VMO, hip abductors, core, entire kinetic chain including proprioception exercises including cone touches, step downs, hip elevations and turn outs.  Could benefit from PT, regular exercise, upright biking, and a PFS knee brace to assist with tracking abnormalities. Proper technique shown and discussed handout in great detail with ATC.  All questions were discussed and answered.     Impression and Recommendations:     This case required medical decision making of moderate complexity.      Note: This dictation was prepared with Dragon dictation along with smaller phrase technology. Any transcriptional errors that result from this process are unintentional.

## 2017-09-08 ENCOUNTER — Encounter: Payer: Self-pay | Admitting: Family Medicine

## 2017-09-08 ENCOUNTER — Ambulatory Visit (INDEPENDENT_AMBULATORY_CARE_PROVIDER_SITE_OTHER): Payer: BLUE CROSS/BLUE SHIELD | Admitting: Family Medicine

## 2017-09-08 DIAGNOSIS — M1712 Unilateral primary osteoarthritis, left knee: Secondary | ICD-10-CM

## 2017-09-08 MED ORDER — DICLOFENAC SODIUM 2 % TD SOLN: 2.0000 "application " | Freq: Two times a day (BID) | TRANSDERMAL | 3 refills | Status: DC

## 2017-09-08 NOTE — Patient Instructions (Signed)
Good to see you  Ice 20 minutes 2 times daily. Usually after activity and before bed. pennsaid pinkie amount topically 2 times daily as needed.   Exercises 3 times a week.  Injected the knee today  Over the counter  Vitamin D 2000 IU daily  Turmeric  daily  Tart cherry extract any dose at night Good shoes with rigid bottom.  Dierdre Harness, Merrell or New balance greater then 700 See em again in 4 weeks to make sure you are doing well.

## 2017-09-08 NOTE — Assessment & Plan Note (Signed)
Patient given injection 09/08/2017. Discussed with patient at great length. Topical anti-inflammatories given, discussed vitamin D supplementation. Discussed icing regimen. Home exercises given and patient work with a Psychologist, educational. Follow-up again in 4 weeks. Could be a candidate for viscous supplementation

## 2017-09-14 ENCOUNTER — Telehealth: Payer: Self-pay

## 2017-09-14 NOTE — Telephone Encounter (Signed)
-----   Message from Weber Cooks sent at 09/11/2017 11:03 AM EDT ----- Regarding: meds for injection Hi [name],  The medication didn't drop for this patient's injection on 09/08/17.  Are you able to add?  Thanks [employer]

## 2017-09-14 NOTE — Telephone Encounter (Signed)
Injection charge has been added by Lindsay----if you need anything else, let me know----have a great day

## 2017-09-14 NOTE — Telephone Encounter (Signed)
Thanks you!

## 2017-10-16 ENCOUNTER — Ambulatory Visit: Payer: BLUE CROSS/BLUE SHIELD | Admitting: Family Medicine

## 2017-10-16 ENCOUNTER — Ambulatory Visit: Payer: Self-pay

## 2017-10-16 ENCOUNTER — Encounter: Payer: Self-pay | Admitting: Family Medicine

## 2017-10-16 VITALS — BP 112/78 | HR 70 | Ht 68.0 in | Wt 204.0 lb

## 2017-10-16 DIAGNOSIS — M25562 Pain in left knee: Principal | ICD-10-CM

## 2017-10-16 DIAGNOSIS — G8929 Other chronic pain: Secondary | ICD-10-CM

## 2017-10-16 DIAGNOSIS — Z23 Encounter for immunization: Secondary | ICD-10-CM | POA: Diagnosis not present

## 2017-10-16 DIAGNOSIS — M1712 Unilateral primary osteoarthritis, left knee: Secondary | ICD-10-CM

## 2017-10-16 NOTE — Progress Notes (Signed)
Tawana ScaleZach Smith D.O. [facility] [address] [place], [place] [address] Phone: [phone] Subjective:    I'm seeing this patient by the request  of:    CC: Left knee pain  BJY:NWGNFAOZHYHPI:Subjective  [name] is a 46 y.o. female coming in for follow up for left knee pain. She had a cortisone injection but states that her knee has seem to have gotten worse. Pain is still under the patella and sometimes goes down her leg. She is having trouble sleeping due to the pain.        Past Medical History:  Diagnosis Date  . Acute upper respiratory infections of unspecified site 08/21/2014  . Anxiety   . Anxiety and depression 10/27/2011  . Granuloma annulare    bx confirmed, improved  . Hx: UTI (urinary tract infection)    in childhood, resolved with urethral stretching  . Hyperlipidemia 04/01/2017  . Left flank pain 08/07/2013  . Migraine 06/22/2017  . Obesity 09/03/2015  . Palpitations   . Preventative health care 08/26/2011  . Toe pain, right 04/07/2012  . URI (upper respiratory infection) 01/09/2013   Past Surgical History:  Procedure Laterality Date  . BUNIONECTOMY     left  . CESAREAN SECTION    . CHOLECYSTECTOMY    . TONSILLECTOMY    . TUBAL LIGATION    . URETHRAL STRICTURE DILATATION     childhood   Social History   Socioeconomic History  . Marital status: Married    Spouse name: Not on file  . Number of children: Not on file  . Years of education: Not on file  . Highest education level: Not on file  Social Needs  . Financial resource strain: Not on file  . Food insecurity - worry: Not on file  . Food insecurity - inability: Not on file  . Transportation needs - medical: Not on file  . Transportation needs - non-medical: Not on file  Occupational History  . Not on file  Tobacco Use  . Smoking status: Never Smoker  . Smokeless tobacco: Never Used  Substance and Sexual Activity  . Alcohol use: No  . Drug use: No  . 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 Not on file  Social History Narrative  . Not on file   Allergies  Allergen Reactions  . Contrast Media [Iodinated Diagnostic Agents]    Family History  Problem Relation Age of Onset  . Hypertension Mother   . Depression Father        suicide  . Hypertension Father   . COPD Father        smoker  . Alzheimer's disease Maternal Grandmother   . Alzheimer's disease Maternal Grandfather   . Heart disease Paternal Grandfather        MI  . Hypertension Brother      Past medical history, social, surgical and family history all reviewed in electronic medical record.  No pertanent information unless stated regarding to the chief complaint.   Review of Systems:Review of systems updated and as accurate as of 10/16/17  No headache, visual changes, nausea, vomiting, diarrhea, constipation, dizziness, abdominal pain, skin rash, fevers, chills, night sweats, weight loss, swollen lymph nodes, body aches, joint swelling, muscle aches, chest pain, shortness of breath, mood changes.   Objective  Blood pressure 112/78, pulse 70, height 5\' 8"  (1.727 m), weight 204 lb (  92.5 kg), SpO2 98 %.  Systems examined below as of 10/16/17   General: No apparent distress alert and oriented x3 mood and affect normal, dressed appropriately.  HEENT: Pupils equal, extraocular movements intact  Respiratory: Patient's speak in full sentences and does not appear short of breath  Cardiovascular: No lower extremity edema, non tender, no erythema  Skin: Warm dry intact with no signs of infection or rash on extremities or on axial skeleton.  Abdomen: Soft nontender  Neuro: Cranial nerves II through XII are intact, neurovascularly intact in all extremities with 2+ DTRs and 2+ pulses.  Lymph: No lymphadenopathy of posterior or anterior cervical chain or axillae bilaterally.  Gait mild antalgic.  MSK:  Non tender with  full range of motion and good stability and symmetric strength and tone of shoulders, elbows, wrist, hip, and ankles bilaterally.  Knee: Left Mild valgus deformity noted.  Tender to palpation over medial and PF joint line.  ROM full in flexion and extension and lower leg rotation. instability with valgus force.  painful patellar compression. Patellar glide with moderate crepitus. Patellar and quadriceps tendons unremarkable. Hamstring and quadriceps strength is normal. Contralateral knee shows significant arthritic changes  After informed written and verbal consent, patient was seated on exam table. Left knee was prepped with alcohol swab and utilizing anterolateral approach, patient's left knee space was injected with 16 mg/2.5 mL of Synvisc (sodium hyaluronate) in a prefilled syringe was injected easily into the knee through a 22-gauge needle.. Patient tolerated the procedure well without immediate complications.   Impression and Recommendations:     This case required medical decision making of moderate complexity.      Note: This dictation was prepared with Dragon dictation along with smaller phrase technology. Any transcriptional errors that result from this process are unintentional.

## 2017-10-16 NOTE — Patient Instructions (Signed)
Good to see you  We will get you set up for the next 2 weeks Ice is your friend.  I do expect improvement after the 2nd injection  See you soon!

## 2017-10-17 NOTE — Assessment & Plan Note (Addendum)
Worsening symptoms.  Started Synvisc injections a week ago.  We discussed icing regimen and home exercises.  We discussed objective density which wants to avoid.  Patient is going to follow-up in 1 week for second in a series of 3 injections.  Has failed all other conservative therapy.

## 2017-10-22 ENCOUNTER — Ambulatory Visit: Payer: BLUE CROSS/BLUE SHIELD | Admitting: Family Medicine

## 2017-10-22 ENCOUNTER — Encounter: Payer: Self-pay | Admitting: Family Medicine

## 2017-10-22 DIAGNOSIS — M1712 Unilateral primary osteoarthritis, left knee: Secondary | ICD-10-CM

## 2017-10-22 NOTE — Assessment & Plan Note (Signed)
Patient is failed all conservative therapy.  Given the second in the series of 3 injections for Visco supplementation.  Continue conservative therapy and follow-up in 1 week.

## 2017-10-22 NOTE — Progress Notes (Signed)
Tawana ScaleZach Florencio Cannon D.O. [facility] [address] [place], [place] [address] Phone: [phone] Subjective:    I'm seeing this patient by the request  of:    CC: Follow-up  BJY:NWGNFAOZHYHPI:Subjective  [name] is a 46 y.o. female coming in with complaint of knee pain.  Found to have arthritic changes.  Patient failed all conservative therapy.  Patient is here for second in the series of 3 injections for the knee.  Patient states that she has had some relief since the last injection.     Past Medical History:  Diagnosis Date  . Acute upper respiratory infections of unspecified site 08/21/2014  . Anxiety   . Anxiety and depression 10/27/2011  . Granuloma annulare    bx confirmed, improved  . Hx: UTI (urinary tract infection)    in childhood, resolved with urethral stretching  . Hyperlipidemia 04/01/2017  . Left flank pain 08/07/2013  . Migraine 06/22/2017  . Obesity 09/03/2015  . Palpitations   . Preventative health care 08/26/2011  . Toe pain, right 04/07/2012  . URI (upper respiratory infection) 01/09/2013   Past Surgical History:  Procedure Laterality Date  . BUNIONECTOMY     left  . CESAREAN SECTION    . CHOLECYSTECTOMY    . TONSILLECTOMY    . TUBAL LIGATION    . URETHRAL STRICTURE DILATATION     childhood   Social History   Socioeconomic History  . Marital status: Married    Spouse name: Not on file  . Number of children: Not on file  . Years of education: Not on file  . Highest education level: Not on file  Social Needs  . Financial resource strain: Not on file  . Food insecurity - worry: Not on file  . Food insecurity - inability: Not on file  . Transportation needs - medical: Not on file  . Transportation needs - non-medical: Not on file  Occupational History  . Not on file  Tobacco Use  . Smoking status: Never Smoker  . Smokeless tobacco: Never Used  Substance and Sexual Activity  . Alcohol use: No  . Drug use: No  . 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 Not on file  Social History Narrative  . Not on file   Allergies  Allergen Reactions  . Contrast Media [Iodinated Diagnostic Agents]    Family History  Problem Relation Age of Onset  . Hypertension Mother   . Depression Father        suicide  . Hypertension Father   . COPD Father        smoker  . Alzheimer's disease Maternal Grandmother   . Alzheimer's disease Maternal Grandfather   . Heart disease Paternal Grandfather        MI  . Hypertension Brother      Past medical history, social, surgical and family history all reviewed in electronic medical record.  No pertanent information unless stated regarding to the chief complaint.   Review of Systems:Review of systems updated and as accurate as of 10/22/17  No headache, visual changes, nausea, vomiting, diarrhea, constipation, dizziness, abdominal pain, skin rash, fevers, chills, night sweats, weight loss, swollen lymph nodes, body aches, joint swelling, muscle aches, chest pain, shortness of breath, mood changes.   Objective  There were no vitals taken for this visit. Systems examined below as of 10/22/17   General:  No apparent distress alert and oriented x3 mood and affect normal, dressed appropriately.  HEENT: Pupils equal, extraocular movements intact  Respiratory: Patient's speak in full sentences and does not appear short of breath  Cardiovascular: No lower extremity edema, non tender, no erythema  Skin: Warm dry intact with no signs of infection or rash on extremities or on axial skeleton.  Abdomen: Soft nontender  Neuro: Cranial nerves II through XII are intact, neurovascularly intact in all extremities with 2+ DTRs and 2+ pulses.  Lymph: No lymphadenopathy of posterior or anterior cervical chain or axillae bilaterally.  Gait normal with good balance and coordination.  MSK:  Non tender with full range of motion and  good stability and symmetric strength and tone of shoulders, elbows, wrist, hip, and ankles bilaterally.  Knee: Left valgus deformity noted.  Tender to palpation over medial and PF joint line.  ROM full in flexion and extension and lower leg rotation. instability with valgus force.  painful patellar compression. Patellar glide with moderate crepitus. Patellar and quadriceps tendons unremarkable. Hamstring and quadriceps strength is normal. Contralateral knee shows mild arthritic changes  After informed written and verbal consent, patient was seated on exam table. Left knee was prepped with alcohol swab and utilizing anterolateral approach, patient's left knee space was injected with 16 mg/2.5 mL of Synvisc (sodium hyaluronate) in a prefilled syringe was injected easily into the knee through a 22-gauge needle.. Patient tolerated the procedure well without immediate complications.    Impression and Recommendations:     This case required medical decision making of moderate complexity.      Note: This dictation was prepared with Dragon dictation along with smaller phrase technology. Any transcriptional errors that result from this process are unintentional.

## 2017-11-06 ENCOUNTER — Ambulatory Visit: Payer: BLUE CROSS/BLUE SHIELD | Admitting: Family Medicine

## 2017-11-17 ENCOUNTER — Ambulatory Visit: Payer: BLUE CROSS/BLUE SHIELD | Admitting: Family Medicine

## 2017-11-18 ENCOUNTER — Other Ambulatory Visit: Payer: Self-pay | Admitting: Family Medicine

## 2017-11-19 DIAGNOSIS — Z6833 Body mass index (BMI) 33.0-33.9, adult: Secondary | ICD-10-CM | POA: Diagnosis not present

## 2017-11-19 DIAGNOSIS — Z01419 Encounter for gynecological examination (general) (routine) without abnormal findings: Secondary | ICD-10-CM | POA: Diagnosis not present

## 2017-11-20 NOTE — Telephone Encounter (Signed)
Requesting:Xanax Contract:no  UDS:no Last OV:06/22/17 Next OV:12/22/17 Last Refill:03/31/17   #[id number]   Please advise

## 2017-11-22 NOTE — Telephone Encounter (Signed)
OK to refill but needs uds and contract

## 2017-11-24 ENCOUNTER — Other Ambulatory Visit: Payer: Self-pay | Admitting: Family Medicine

## 2017-11-24 DIAGNOSIS — F329 Major depressive disorder, single episode, unspecified: Secondary | ICD-10-CM

## 2017-11-24 DIAGNOSIS — F32A Depression, unspecified: Secondary | ICD-10-CM

## 2017-11-24 DIAGNOSIS — F419 Anxiety disorder, unspecified: Principal | ICD-10-CM

## 2017-11-24 DIAGNOSIS — Z79899 Other long term (current) drug therapy: Secondary | ICD-10-CM

## 2017-11-24 NOTE — Telephone Encounter (Signed)
Copied from CRM [phone]. Topic: Quick Communication - Rx Refill/Question >> Nov 24, 2017  1:37 PM Alexander BergeronBarksdale, Harvey B wrote: Pt called about the ALPRAZolam Prudy Feeler(XANAX) 0.25 MG tablet [Pharmacy Med Name: ALPRAZOLAM 0.25 MG TABLET] [[id number]][[id number]] Rx, the pharmacy hasnt heard anything on the request, contact pharmacy if needed

## 2017-11-25 NOTE — Telephone Encounter (Signed)
Please advise refill?  Last RX: 42/24/18, #40 x 1 refills Last OV: 06/22/17 Next OV: 12/22/17 UDS:  Not on file CSC:  Not on file CSR:  Reviewed, No discrepancies identified

## 2017-11-25 NOTE — Telephone Encounter (Signed)
OK to refill requested med with same sig, same number and 1 rf but needs UDS and contract

## 2017-11-26 ENCOUNTER — Encounter (INDEPENDENT_AMBULATORY_CARE_PROVIDER_SITE_OTHER): Payer: Self-pay

## 2017-11-26 NOTE — Telephone Encounter (Signed)
Copied from CRM [phone]#[id number]. Topic: Quick Communication - Rx Refill/Question >> Nov 26, 2017  4:01 PM Darletta MollLander, Lumin L wrote: Has the patient contacted their pharmacy? Yes.   (Agent: If no, request that the patient contact the pharmacy for the refill.) Preferred Pharmacy (with phone number or street name): pick up in office? Agent: Please be advised that RX refills may take up to 3 business days. We ask that you follow-up with your pharmacy. Checking on status of xanax

## 2017-11-27 MED ORDER — ALPRAZOLAM 0.25 MG PO TABS: ORAL_TABLET | ORAL | 1 refills | Status: DC

## 2017-11-27 NOTE — Telephone Encounter (Signed)
Rx called to pharmacy voicemail. Pt has been notified and will complete UDS and sign contract on Wednesday 12/02/17. Contract placed at front desk and order placed for UDS.

## 2017-12-02 ENCOUNTER — Other Ambulatory Visit: Payer: BLUE CROSS/BLUE SHIELD

## 2017-12-02 DIAGNOSIS — F329 Major depressive disorder, single episode, unspecified: Secondary | ICD-10-CM

## 2017-12-02 DIAGNOSIS — Z79899 Other long term (current) drug therapy: Secondary | ICD-10-CM | POA: Diagnosis not present

## 2017-12-02 DIAGNOSIS — F419 Anxiety disorder, unspecified: Principal | ICD-10-CM

## 2017-12-02 DIAGNOSIS — F32A Depression, unspecified: Secondary | ICD-10-CM

## 2017-12-03 LAB — PAIN MGMT, PROFILE 8 W/CONF, U
6 Acetylmorphine: NEGATIVE ng/mL (ref <10)
ALCOHOL METABOLITES: NEGATIVE ng/mL (ref <500)
Amphetamines: NEGATIVE ng/mL (ref <500)
BENZODIAZEPINES: NEGATIVE ng/mL (ref <100)
Buprenorphine, Urine: NEGATIVE ng/mL (ref <5)
COCAINE METABOLITE: NEGATIVE ng/mL (ref <150)
CREATININE: 123.5 mg/dL
MARIJUANA METABOLITE: NEGATIVE ng/mL (ref <20)
MDMA: NEGATIVE ng/mL (ref <500)
Opiates: NEGATIVE ng/mL (ref <100)
Oxidant: NEGATIVE ug/mL (ref <200)
Oxycodone: NEGATIVE ng/mL (ref <100)
pH: 7.42 (ref 4.5–9.0)

## 2017-12-21 DIAGNOSIS — R1032 Left lower quadrant pain: Secondary | ICD-10-CM | POA: Diagnosis not present

## 2017-12-21 DIAGNOSIS — R102 Pelvic and perineal pain: Secondary | ICD-10-CM | POA: Diagnosis not present

## 2017-12-22 ENCOUNTER — Encounter: Payer: Self-pay | Admitting: Family Medicine

## 2017-12-22 ENCOUNTER — Ambulatory Visit (INDEPENDENT_AMBULATORY_CARE_PROVIDER_SITE_OTHER): Payer: BLUE CROSS/BLUE SHIELD | Admitting: Family Medicine

## 2017-12-22 VITALS — BP 102/54 | HR 71 | Temp 98.1°F | Resp 16 | Ht 68.11 in | Wt 211.4 lb

## 2017-12-22 DIAGNOSIS — R51 Headache: Secondary | ICD-10-CM | POA: Diagnosis not present

## 2017-12-22 DIAGNOSIS — Z0000 Encounter for general adult medical examination without abnormal findings: Secondary | ICD-10-CM | POA: Diagnosis not present

## 2017-12-22 DIAGNOSIS — E785 Hyperlipidemia, unspecified: Secondary | ICD-10-CM

## 2017-12-22 DIAGNOSIS — R519 Headache, unspecified: Secondary | ICD-10-CM | POA: Insufficient documentation

## 2017-12-22 NOTE — Assessment & Plan Note (Signed)
Encouraged increased hydration, 64 ounces of clear fluids daily. Minimize alcohol and caffeine. Eat small frequent meals with lean proteins and complex carbs. Avoid high and low blood sugars. Get adequate sleep, 7-8 hours a night. Needs exercise daily preferably in the morning.try Lidocaine gel to the neck, moist heat and gentle stretching. Has had some snoring so if it worsens consider

## 2017-12-22 NOTE — Progress Notes (Signed)
Subjective:  I acted as a Neurosurgeon for [employer] Fuller Song, RMA   Patient ID: [name], female    DOB: 08-29-1971, 47 y.o.   MRN: [id number]  Chief Complaint  Patient presents with  . Annual Exam    HPI  Patient is in today for annual exam and follow up. She feels well today but notes she has been having more trouble with some headaches recently.  They do not occur every day but with more frequency than they have in the past.  No recent febrile illness, congestion, trauma or falls.  No other neurologic complaints.  Is doing well with activities of daily living.  Tries to maintain a heart healthy diet and stay active. Denies CP/palp/SOB/congestion/fevers/GI or GU c/o. Taking meds as prescribed  Patient Care Team: Bradd Canary, MD as PCP - General (Family Medicine)   Past Medical History:  Diagnosis Date  . Acute upper respiratory infections of unspecified site 08/21/2014  . Anxiety   . Anxiety and depression 10/27/2011  . Granuloma annulare    bx confirmed, improved  . Hx: UTI (urinary tract infection)    in childhood, resolved with urethral stretching  . Hyperlipidemia 04/01/2017  . Left flank pain 08/07/2013  . Migraine 06/22/2017  . Obesity 09/03/2015  . Palpitations   . Preventative health care 08/26/2011  . Toe pain, right 04/07/2012  . URI (upper respiratory infection) 01/09/2013    Past Surgical History:  Procedure Laterality Date  . BUNIONECTOMY     left  . CESAREAN SECTION    . CHOLECYSTECTOMY    . TONSILLECTOMY    . TUBAL LIGATION    . URETHRAL STRICTURE DILATATION     childhood    Family History  Problem Relation Age of Onset  . Hypertension Mother   . Depression Father        suicide  . Hypertension Father   . COPD Father        smoker  . Alzheimer's disease Maternal Grandmother   . Alzheimer's disease Maternal Grandfather   . Heart disease Paternal Grandfather        MI  . Hypertension Brother     Social History   Socioeconomic History    . Marital status: Married    Spouse name: Not on file  . Number of children: Not on file  . Years of education: Not on file  . Highest education level: Not on file  Social Needs  . Financial resource strain: Not on file  . Food insecurity - worry: Not on file  . Food insecurity - inability: Not on file  . Transportation needs - medical: Not on file  . Transportation needs - non-medical: Not on file  Occupational History  . Not on file  Tobacco Use  . Smoking status: Never Smoker  . Smokeless tobacco: Never Used  Substance and Sexual Activity  . Alcohol use: No  . Drug use: No  . Sexual activity: Yes    Comment: lives with husband and twins. 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 Not on file  Social History Narrative  . Not on file    Outpatient Medications Prior to Visit  Medication Sig Dispense Refill  . ALPRAZolam (XANAX) 0.25 MG tablet TAKE 1 TABLET BY MOUTH 2 TIMES DAILY AS NEEDED 40 tablet 1  . escitalopram (LEXAPRO) 20 MG tablet Take 1 tablet (20 mg total) by mouth daily. 90 tablet 1  . ondansetron (ZOFRAN) 4  MG tablet Take 1 tablet (4 mg total) by mouth every 8 (eight) hours as needed for nausea or vomiting. 20 tablet 1  . ranitidine (ZANTAC) 150 MG tablet Take 1 tablet (150 mg total) by mouth 2 (two) times daily as needed for heartburn. 60 tablet 3  . Diclofenac Sodium (PENNSAID) 2 % SOLN Place 2 application onto the skin 2 (two) times daily. (Patient not taking: Reported on 12/22/2017) 112 g 3   No facility-administered medications prior to visit.     Allergies  Allergen Reactions  . Contrast Media [Iodinated Diagnostic Agents]     Review of Systems  Constitutional: Negative for chills, fever and malaise/fatigue.  HENT: Negative for congestion and hearing loss.   Eyes: Negative for discharge.  Respiratory: Negative for cough, sputum production and shortness of breath.   Cardiovascular: Negative for chest pain, palpitations  and leg swelling.  Gastrointestinal: Negative for abdominal pain, blood in stool, constipation, diarrhea, heartburn, nausea and vomiting.  Genitourinary: Negative for dysuria, frequency, hematuria and urgency.  Musculoskeletal: Negative for back pain, falls and myalgias.  Skin: Negative for rash.  Neurological: Positive for headaches. Negative for dizziness, sensory change, loss of consciousness and weakness.  Endo/Heme/Allergies: Negative for environmental allergies. Does not bruise/bleed easily.  Psychiatric/Behavioral: Negative for depression and suicidal ideas. The patient is not nervous/anxious and does not have insomnia.        Objective:    Physical Exam  Constitutional: She is oriented to person, place, and time. She appears well-developed and well-nourished. No distress.  HENT:  Head: Normocephalic and atraumatic.  Eyes: Conjunctivae are normal.  Neck: Neck supple. No thyromegaly present.  Cardiovascular: Normal rate, regular rhythm and normal heart sounds.  No murmur heard. Pulmonary/Chest: Effort normal and breath sounds normal. No respiratory distress.  Abdominal: Soft. Bowel sounds are normal. She exhibits no distension and no mass. There is no tenderness.  Musculoskeletal: She exhibits no edema.  Lymphadenopathy:    She has no cervical adenopathy.  Neurological: She is alert and oriented to person, place, and time.  Skin: Skin is warm and dry.  Psychiatric: She has a normal mood and affect. Her behavior is normal.    BP (!) 102/54 (BP Location: Left Arm, Patient Position: Sitting, Cuff Size: Large)   Pulse 71   Temp 98.1 F (36.7 C) (Oral)   Resp 16   Ht 5' 8.11" (1.73 m)   Wt 211 lb 6.4 oz (95.9 kg)   SpO2 97%   BMI 32.04 kg/m  Wt Readings from Last 3 Encounters:  12/22/17 211 lb 6.4 oz (95.9 kg)  10/22/17 206 lb (93.4 kg)  10/16/17 204 lb (92.5 kg)   BP Readings from Last 3 Encounters:  12/22/17 (!) 102/54  10/22/17 118/74  10/16/17 112/78      Immunization History  Administered Date(s) Administered  . Influenza Split 08/26/2011  . Influenza,inj,Quad PF,6+ Mos 08/21/2014, 09/03/2015, 10/16/2017  . PPD Test 01/05/2013  . Tdap 08/26/2011    Health Maintenance  Topic Date Due  . PAP SMEAR  11/19/2020  . TETANUS/TDAP  08/25/2021  . INFLUENZA VACCINE  Completed  . HIV Screening  Completed    Lab Results  Component Value Date   WBC 7.2 12/22/2017   HGB 12.8 12/22/2017   HCT 38.1 12/22/2017   PLT 294.0 12/22/2017   GLUCOSE 85 12/22/2017   CHOL 156 12/22/2017   TRIG 100.0 12/22/2017   HDL 52.00 12/22/2017   LDLCALC 84 12/22/2017   ALT 13 12/22/2017   AST  17 12/22/2017   NA 137 12/22/2017   K 3.9 12/22/2017   CL 103 12/22/2017   CREATININE 0.98 12/22/2017   BUN 20 12/22/2017   CO2 29 12/22/2017   TSH 4.91 (H) 12/22/2017    Lab Results  Component Value Date   TSH 4.91 (H) 12/22/2017   Lab Results  Component Value Date   WBC 7.2 12/22/2017   HGB 12.8 12/22/2017   HCT 38.1 12/22/2017   MCV 88.2 12/22/2017   PLT 294.0 12/22/2017   Lab Results  Component Value Date   NA 137 12/22/2017   K 3.9 12/22/2017   CO2 29 12/22/2017   GLUCOSE 85 12/22/2017   BUN 20 12/22/2017   CREATININE 0.98 12/22/2017   BILITOT 0.5 12/22/2017   ALKPHOS 58 12/22/2017   AST 17 12/22/2017   ALT 13 12/22/2017   PROT 7.2 12/22/2017   ALBUMIN 4.1 12/22/2017   CALCIUM 9.0 12/22/2017   GFR 64.40 12/22/2017   Lab Results  Component Value Date   CHOL 156 12/22/2017   Lab Results  Component Value Date   HDL 52.00 12/22/2017   Lab Results  Component Value Date   LDLCALC 84 12/22/2017   Lab Results  Component Value Date   TRIG 100.0 12/22/2017   Lab Results  Component Value Date   CHOLHDL 3 12/22/2017   No results found for: HGBA1C       Assessment & Plan:   Problem List Items Addressed This Visit    Preventative health care   Relevant Orders   CBC (Completed)   Comprehensive metabolic panel (Completed)    TSH (Completed)   Hyperlipidemia - Primary   Relevant Orders   Lipid panel (Completed)   Headache    Encouraged increased hydration, 64 ounces of clear fluids daily. Minimize alcohol and caffeine. Eat small frequent meals with lean proteins and complex carbs. Avoid high and low blood sugars. Get adequate sleep, 7-8 hours a night. Needs exercise daily preferably in the morning.try Lidocaine gel to the neck, moist heat and gentle stretching. Has had some snoring so if it worsens consider       Relevant Orders   CBC (Completed)   Comprehensive metabolic panel (Completed)   TSH (Completed)      I am having Brandi BondsJennifer M. Oshea maintain her ranitidine, ondansetron, escitalopram, Diclofenac Sodium, and ALPRAZolam.  No orders of the defined types were placed in this encounter.   CMA served as Neurosurgeonscribe during this visit. History, Physical and Plan performed by medical provider. Documentation and orders reviewed and attested to.  Danise EdgeStacey Blyth, MD

## 2017-12-22 NOTE — Patient Instructions (Addendum)
Tums as needed Now company products at [email or web address] or [employer] Vitamin D 2000 IU daily Probiotic Curcumen capsule daily Krill or fish oil daily  Encouraged increased hydration, 64 ounces of clear fluids daily. Minimize alcohol and caffeine. Eat small frequent meals with lean proteins and complex carbs. Avoid high and low blood sugars. Get adequate sleep, 7-8 hours a night. Needs exercise daily preferably in the morning. Try Lidocaine gel with stretching to the neck and consider neurology if they worsen If morning headaches worsen consider sleep study  Preventive Care 40-64 Years, Female Preventive care refers to lifestyle choices and visits with your health care provider that can promote health and wellness. What does preventive care include?  A yearly physical exam. This is also called an annual well check.  Dental exams once or twice a year.  Routine eye exams. Ask your health care provider how often you should have your eyes checked.  Personal lifestyle choices, including: ? Daily care of your teeth and gums. ? Regular physical activity. ? Eating a healthy diet. ? Avoiding tobacco and drug use. ? Limiting alcohol use. ? Practicing safe sex. ? Taking low-dose aspirin daily starting at age 21. ? Taking vitamin and mineral supplements as recommended by your health care provider. What happens during an annual well check? The services and screenings done by your health care provider during your annual well check will depend on your age, overall health, lifestyle risk factors, and family history of disease. Counseling Your health care provider may ask you questions about your:  Alcohol use.  Tobacco use.  Drug use.  Emotional well-being.  Home and relationship well-being.  Sexual activity.  Eating habits.  Work and work Statistician.  Method of birth control.  Menstrual cycle.  Pregnancy history.  Screening You may have the following tests or  measurements:  Height, weight, and BMI.  Blood pressure.  Lipid and cholesterol levels. These may be checked every 5 years, or more frequently if you are over 42 years old.  Skin check.  Lung cancer screening. You may have this screening every year starting at age 40 if you have a 30-pack-year history of smoking and currently smoke or have quit within the past 15 years.  Fecal occult blood test (FOBT) of the stool. You may have this test every year starting at age 55.  Flexible sigmoidoscopy or colonoscopy. You may have a sigmoidoscopy every 5 years or a colonoscopy every 10 years starting at age 20.  Hepatitis C blood test.  Hepatitis B blood test.  Sexually transmitted disease (STD) testing.  Diabetes screening. This is done by checking your blood sugar (glucose) after you have not eaten for a while (fasting). You may have this done every 1-3 years.  Mammogram. This may be done every 1-2 years. Talk to your health care provider about when you should start having regular mammograms. This may depend on whether you have a family history of breast cancer.  BRCA-related cancer screening. This may be done if you have a family history of breast, ovarian, tubal, or peritoneal cancers.  Pelvic exam and Pap test. This may be done every 3 years starting at age 4. Starting at age 87, this may be done every 5 years if you have a Pap test in combination with an HPV test.  Bone density scan. This is done to screen for osteoporosis. You may have this scan if you are at high risk for osteoporosis.  Discuss your test results, treatment options, and if necessary, the  need for more tests with your health care provider. Vaccines Your health care provider may recommend certain vaccines, such as:  Influenza vaccine. This is recommended every year.  Tetanus, diphtheria, and acellular pertussis (Tdap, Td) vaccine. You may need a Td booster every 10 years.  Varicella vaccine. You may need this if  you have not been vaccinated.  Zoster vaccine. You may need this after age 76.  Measles, mumps, and rubella (MMR) vaccine. You may need at least one dose of MMR if you were born in 1957 or later. You may also need a second dose.  Pneumococcal 13-valent conjugate (PCV13) vaccine. You may need this if you have certain conditions and were not previously vaccinated.  Pneumococcal polysaccharide (PPSV23) vaccine. You may need one or two doses if you smoke cigarettes or if you have certain conditions.  Meningococcal vaccine. You may need this if you have certain conditions.  Hepatitis A vaccine. You may need this if you have certain conditions or if you travel or work in places where you may be exposed to hepatitis A.  Hepatitis B vaccine. You may need this if you have certain conditions or if you travel or work in places where you may be exposed to hepatitis B.  Haemophilus influenzae type b (Hib) vaccine. You may need this if you have certain conditions.  Talk to your health care provider about which screenings and vaccines you need and how often you need them. This information is not intended to replace advice given to you by your health care provider. Make sure you discuss any questions you have with your health care provider. Document Released: 12/21/2015 Document Revised: 08/13/2016 Document Reviewed: 09/25/2015 Elsevier Interactive Patient Education  [employer].

## 2017-12-23 LAB — COMPREHENSIVE METABOLIC PANEL
ALT: 13 U/L (ref 0–35)
AST: 17 U/L (ref 0–37)
Albumin: 4.1 g/dL (ref 3.5–5.2)
Alkaline Phosphatase: 58 U/L (ref 39–117)
BILIRUBIN TOTAL: 0.5 mg/dL (ref 0.2–1.2)
BUN: 20 mg/dL (ref 6–23)
CHLORIDE: 103 meq/L (ref 96–112)
CO2: 29 meq/L (ref 19–32)
CREATININE: 0.98 mg/dL (ref 0.40–1.20)
Calcium: 9 mg/dL (ref 8.4–10.5)
GFR: 64.4 mL/min (ref >60.00)
GLUCOSE: 85 mg/dL (ref 70–99)
Potassium: 3.9 mEq/L (ref 3.5–5.1)
Sodium: 137 mEq/L (ref 135–145)
Total Protein: 7.2 g/dL (ref 6.0–8.3)

## 2017-12-23 LAB — CBC
HCT: 38.1 % (ref 36.0–46.0)
Hemoglobin: 12.8 g/dL (ref 12.0–15.0)
MCHC: 33.7 g/dL (ref 30.0–36.0)
MCV: 88.2 fl (ref 78.0–100.0)
Platelets: 294 10*3/uL (ref 150.0–400.0)
RBC: 4.32 Mil/uL (ref 3.87–5.11)
RDW: 12.8 % (ref 11.5–15.5)
WBC: 7.2 10*3/uL (ref 4.0–10.5)

## 2017-12-23 LAB — LIPID PANEL
CHOL/HDL RATIO: 3
Cholesterol: 156 mg/dL (ref 0–200)
HDL: 52 mg/dL (ref >39.00)
LDL CALC: 84 mg/dL (ref 0–99)
NONHDL: 104.19
Triglycerides: 100 mg/dL (ref 0.0–149.0)
VLDL: 20 mg/dL (ref 0.0–40.0)

## 2017-12-23 LAB — TSH: TSH: 4.91 u[IU]/mL — ABNORMAL HIGH (ref 0.35–4.50)

## 2017-12-24 ENCOUNTER — Other Ambulatory Visit (INDEPENDENT_AMBULATORY_CARE_PROVIDER_SITE_OTHER): Payer: BLUE CROSS/BLUE SHIELD

## 2017-12-24 DIAGNOSIS — R7989 Other specified abnormal findings of blood chemistry: Secondary | ICD-10-CM

## 2017-12-24 LAB — T4, FREE: FREE T4: 0.8 ng/dL (ref 0.60–1.60)

## 2017-12-25 MED ORDER — LEVOTHYROXINE SODIUM 25 MCG PO TABS: 25.0000 ug | ORAL_TABLET | Freq: Every day | ORAL | 3 refills | Status: DC

## 2017-12-28 ENCOUNTER — Other Ambulatory Visit: Payer: BLUE CROSS/BLUE SHIELD

## 2018-01-12 ENCOUNTER — Other Ambulatory Visit: Payer: Self-pay | Admitting: Family Medicine

## 2018-02-25 ENCOUNTER — Other Ambulatory Visit (INDEPENDENT_AMBULATORY_CARE_PROVIDER_SITE_OTHER): Payer: BLUE CROSS/BLUE SHIELD

## 2018-02-25 DIAGNOSIS — R7989 Other specified abnormal findings of blood chemistry: Secondary | ICD-10-CM

## 2018-02-25 LAB — TSH: TSH: 3.67 u[IU]/mL (ref 0.35–4.50)

## 2018-03-22 ENCOUNTER — Other Ambulatory Visit: Payer: Self-pay | Admitting: Family Medicine

## 2018-03-24 DIAGNOSIS — N9089 Other specified noninflammatory disorders of vulva and perineum: Secondary | ICD-10-CM | POA: Diagnosis not present

## 2018-03-24 DIAGNOSIS — N762 Acute vulvitis: Secondary | ICD-10-CM | POA: Diagnosis not present

## 2018-04-13 ENCOUNTER — Other Ambulatory Visit: Payer: Self-pay | Admitting: Family Medicine

## 2018-04-26 ENCOUNTER — Other Ambulatory Visit: Payer: Self-pay | Admitting: Family Medicine

## 2018-05-04 DIAGNOSIS — Z1231 Encounter for screening mammogram for malignant neoplasm of breast: Secondary | ICD-10-CM | POA: Diagnosis not present

## 2018-05-12 DIAGNOSIS — H40011 Open angle with borderline findings, low risk, right eye: Secondary | ICD-10-CM | POA: Diagnosis not present

## 2018-06-03 ENCOUNTER — Other Ambulatory Visit: Payer: Self-pay | Admitting: Family Medicine

## 2018-06-20 ENCOUNTER — Other Ambulatory Visit: Payer: Self-pay | Admitting: Family Medicine

## 2018-06-21 NOTE — Telephone Encounter (Signed)
Requesting:xanax Contract:yes UDS:low risk next screen 12/03/18 Last OV:12/22/17 Next OV:not scheduled  Last Refill:11/27/17 [id number] Database:   Please advise

## 2018-07-16 ENCOUNTER — Other Ambulatory Visit: Payer: Self-pay | Admitting: Family Medicine

## 2018-07-31 DIAGNOSIS — J020 Streptococcal pharyngitis: Secondary | ICD-10-CM | POA: Diagnosis not present

## 2018-08-22 ENCOUNTER — Other Ambulatory Visit: Payer: Self-pay | Admitting: Family Medicine

## 2018-09-07 ENCOUNTER — Other Ambulatory Visit: Payer: Self-pay | Admitting: Family Medicine

## 2018-09-07 MED ORDER — LEVOTHYROXINE SODIUM 25 MCG PO TABS: 25.0000 ug | ORAL_TABLET | Freq: Every day | ORAL | 1 refills | Status: DC

## 2018-09-07 NOTE — Addendum Note (Signed)
Addended by: Crissie Sickles A on: 09/07/2018 06:45 PM   Modules accepted: Orders

## 2018-09-14 ENCOUNTER — Other Ambulatory Visit: Payer: Self-pay | Admitting: Family Medicine

## 2018-09-25 ENCOUNTER — Other Ambulatory Visit: Payer: Self-pay | Admitting: Family Medicine

## 2018-10-12 ENCOUNTER — Ambulatory Visit: Payer: BLUE CROSS/BLUE SHIELD

## 2018-10-18 ENCOUNTER — Ambulatory Visit: Payer: BLUE CROSS/BLUE SHIELD

## 2018-10-20 ENCOUNTER — Ambulatory Visit (INDEPENDENT_AMBULATORY_CARE_PROVIDER_SITE_OTHER): Payer: BLUE CROSS/BLUE SHIELD

## 2018-10-20 DIAGNOSIS — Z23 Encounter for immunization: Secondary | ICD-10-CM | POA: Diagnosis not present

## 2018-11-26 ENCOUNTER — Ambulatory Visit: Payer: BLUE CROSS/BLUE SHIELD | Admitting: Physician Assistant

## 2018-11-26 ENCOUNTER — Encounter: Payer: Self-pay | Admitting: Physician Assistant

## 2018-11-26 ENCOUNTER — Other Ambulatory Visit: Payer: Self-pay

## 2018-11-26 VITALS — BP 114/78 | HR 102 | Temp 98.6°F | Resp 16 | Ht 68.11 in | Wt 220.0 lb

## 2018-11-26 DIAGNOSIS — R6889 Other general symptoms and signs: Secondary | ICD-10-CM

## 2018-11-26 MED ORDER — BENZONATATE 100 MG PO CAPS: 100.0000 mg | ORAL_CAPSULE | Freq: Three times a day (TID) | ORAL | 0 refills | Status: DC | PRN

## 2018-11-26 MED ORDER — OSELTAMIVIR PHOSPHATE 75 MG PO CAPS: 75.0000 mg | ORAL_CAPSULE | Freq: Two times a day (BID) | ORAL | 0 refills | Status: DC

## 2018-11-26 NOTE — Patient Instructions (Signed)
E visit for Flu like symptoms   We are sorry that you are not feeling well.  Here is how we plan to help! Based on what you have shared with me it looks like you may have a respiratory virus that may be influenza.  Influenza or "the flu" is   an infection caused by a respiratory virus. The flu virus is highly contagious and persons who did not receive their yearly flu vaccination may "catch" the flu from close contact.  We have anti-viral medications to treat the viruses that cause this infection. They are not a "cure" and only shorten the course of the infection. These prescriptions are most effective when they are given within the first 2 days of "flu" symptoms. Antiviral medication are indicated if you have a high risk of complications from the flu. You should  also consider an antiviral medication if you are in close contact with someone who is at risk. These medications can help patients avoid complications from the flu  but have side effects that you should know. Possible side effects from Tamiflu or oseltamivir include nausea, vomiting, diarrhea, dizziness, headaches, eye redness, sleep problems or other respiratory symptoms. You should not take Tamiflu if you have an allergy to oseltamivir or any to the ingredients in Tamiflu.  Based upon your symptoms and potential risk factors I have prescribed Oseltamivir (Tamiflu).  It has been sent to your designated pharmacy.  You will take one 75 mg capsule orally twice a day for the next 5 days.  ANYONE WHO HAS FLU SYMPTOMS SHOULD: . Stay home. The flu is highly contagious and going out or to work exposes others! . Be sure to drink plenty of fluids. Water is fine as well as fruit juices, sodas and electrolyte beverages. You may want to stay away from caffeine or alcohol. If you are nauseated, try taking small sips of liquids. How do you know if you are getting enough fluid? Your urine should be a pale yellow or almost colorless. . Get rest. . Taking a  steamy shower or using a humidifier may help nasal congestion and ease sore throat pain. Using a saline nasal spray works much the same way. . Cough drops, hard candies and sore throat lozenges may ease your cough. . Line up a caregiver. Have someone check on you regularly.   GET HELP RIGHT AWAY IF: . You cannot keep down liquids or your medications. . You become short of breath . Your fell like you are going to pass out or loose consciousness. . Your symptoms persist after you have completed your treatment plan MAKE SURE YOU   Understand these instructions.  Will watch your condition.  Will get help right away if you are not doing well or get worse.

## 2018-11-26 NOTE — Progress Notes (Signed)
Patient presents to clinic today c/o 1.5 days of aches, chills, nasal congestion, sore throat and dry cough. Notes chest tenderness only with coughing. Denies sinus pain, ear pain to tooth pain. Does note mild, intermittent headache. Has taken Sinex for symptoms. Daughter was diagnosed with the flu yesterday. Patient without significant respiratory history but is in close contact with the very young.   Past Medical History:  Diagnosis Date  . Acute upper respiratory infections of unspecified site 08/21/2014  . Anxiety   . Anxiety and depression 10/27/2011  . Granuloma annulare    bx confirmed, improved  . Hx: UTI (urinary tract infection)    in childhood, resolved with urethral stretching  . Hyperlipidemia 04/01/2017  . Left flank pain 08/07/2013  . Migraine 06/22/2017  . Obesity 09/03/2015  . Palpitations   . Preventative health care 08/26/2011  . Toe pain, right 04/07/2012  . URI (upper respiratory infection) 01/09/2013    Current Outpatient Medications on File Prior to Visit  Medication Sig Dispense Refill  . ALPRAZolam (XANAX) 0.25 MG tablet TAKE 1 TABLET BY MOUTH TWICE A DAY AS NEEDED 40 tablet 0  . Diclofenac Sodium (PENNSAID) 2 % SOLN Place 2 application onto the skin 2 (two) times daily. 112 g 3  . escitalopram (LEXAPRO) 20 MG tablet TAKE 1 TABLET BY MOUTH EVERY DAY 90 tablet 0  . levothyroxine (SYNTHROID, LEVOTHROID) 25 MCG tablet Take 1 tablet (25 mcg total) by mouth daily before breakfast. 90 tablet 1  . ondansetron (ZOFRAN) 4 MG tablet Take 1 tablet (4 mg total) by mouth every 8 (eight) hours as needed for nausea or vomiting. 20 tablet 1  . ranitidine (ZANTAC) 150 MG tablet Take 1 tablet (150 mg total) by mouth 2 (two) times daily as needed for heartburn. 60 tablet 3   No current facility-administered medications on file prior to visit.     Allergies  Allergen Reactions  . Contrast Media [Iodinated Diagnostic Agents]     Family History  Problem Relation Age of Onset  .  Hypertension Mother   . Depression Father        suicide  . Hypertension Father   . COPD Father        smoker  . Alzheimer's disease Maternal Grandmother   . Alzheimer's disease Maternal Grandfather   . Heart disease Paternal Grandfather        MI  . Hypertension Brother     Social History   Socioeconomic History  . Marital status: Married    Spouse name: Not on file  . Number of children: Not on file  . Years of education: Not on file  . Highest education level: Not on file  Occupational History  . Not on file  Social Needs  . Financial resource strain: Not on file  . Food insecurity:    Worry: Not on file    Inability: Not on file  . Transportation needs:    Medical: Not on file    Non-medical: Not on file  Tobacco Use  . Smoking status: Never Smoker  . Smokeless tobacco: Never Used  Substance and Sexual Activity  . Alcohol use: No  . Drug use: No  . 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 Physical activity:    Days per week: Not on file    Minutes per session: Not on file  . Stress: Not on file  Relationships  .  Social connections:    Talks on phone: Not on file    Gets together: Not on file    Attends religious service: Not on file    Active member of club or organization: Not on file    Attends meetings of clubs or organizations: Not on file    Relationship status: Not on file  Other Topics Concern  . Not on file  Social History Narrative  . Not on file   Review of Systems - See HPI.  All other ROS are negative.  BP 114/78   Pulse (!) 102   Temp 98.6 F (37 C) (Oral)   Resp 16   Ht 5' 8.11" (1.73 m)   Wt 220 lb (99.8 kg)   SpO2 98%   BMI 33.34 kg/m   Physical Exam Constitutional:      General: She is not in acute distress.    Appearance: Normal appearance.  HENT:     Head: Normocephalic and atraumatic.     Right Ear: Tympanic membrane normal.     Left  Ear: Tympanic membrane normal.     Nose: Congestion and rhinorrhea present.     Mouth/Throat:     Mouth: Mucous membranes are moist.  Neck:     Musculoskeletal: Neck supple. No muscular tenderness.  Cardiovascular:     Rate and Rhythm: Normal rate and regular rhythm.     Pulses: Normal pulses.     Heart sounds: Normal heart sounds.  Pulmonary:     Effort: Pulmonary effort is normal.  Neurological:     General: No focal deficit present.     Mental Status: She is alert and oriented to person, place, and time.    Assessment/Plan: 1. Flu-like symptoms + exposure. Tmax 101. Classic symptoms. No alarm findings on examination. Start [place]. Rx Tessalon. Supportive measures and OTC medications reviewed.  - oseltamivir (TAMIFLU) 75 MG capsule; Take 1 capsule (75 mg total) by mouth 2 (two) times daily.  Dispense: 10 capsule; Refill: 0 - benzonatate (TESSALON) 100 MG capsule; Take 1 capsule (100 mg total) by mouth 3 (three) times daily as needed for cough.  Dispense: 30 capsule; Refill: 0   Piedad ClimesWilliam Cody Domnique Vantine, PA-C

## 2018-11-28 DIAGNOSIS — J101 Influenza due to other identified influenza virus with other respiratory manifestations: Secondary | ICD-10-CM | POA: Diagnosis not present

## 2018-11-29 ENCOUNTER — Ambulatory Visit: Payer: Self-pay

## 2018-11-29 NOTE — Telephone Encounter (Signed)
Returned call to patient who states she has had a cough since last Wednesday.  She states that her daughter and the her son were Dx with flu and she is presently taking Tamiflu. She says she is also taking prednisone given to her when she visited urgent care this weekend.  Today she states that she has a productive cough.  The sputum she coughs up is brown.  She denies blood.  She has sinus pressure and pain that makes her teeth hurt. She has recently this December been on a cruise to the [place]. She is requesting an antibiotic and is refusing an appointment.  She states she will call after the holidays if needed. She still would like an antibiotic called to her pharmacy. Reason for Disposition . [1] Nasal discharge AND [2] present > 10 days  Answer Assessment - Initial Assessment Questions 1. ONSET: "When did the cough begin?"      Wednesday and Friday fever 2. SEVERITY: "How bad is the cough today?"      Better now 3. RESPIRATORY DISTRESS: "Describe your breathing."      ok 4. FEVER: "Do you have a fever?" If so, ask: "What is your temperature, how was it measured, and when did it start?"     no 5. SPUTUM: "Describe the color of your sputum" (clear, white, yellow, green)     brown 6. HEMOPTYSIS: "Are you coughing up any blood?" If so ask: "How much?" (flecks, streaks, tablespoons, etc.)     no 7. CARDIAC HISTORY: "Do you have any history of heart disease?" (e.g., heart attack, congestive heart failure)      no 8. LUNG HISTORY: "Do you have any history of lung disease?"  (e.g., pulmonary embolus, asthma, emphysema)     no 9. PE RISK FACTORS: "Do you have a history of blood clots?" (or: recent major surgery, recent prolonged travel, bedridden)     no 10. OTHER SYMPTOMS: "Do you have any other symptoms?" (e.g., runny nose, wheezing, chest pain)       Nasal congestion and pain and pressure teeth 11. PREGNANCY: "Is there any chance you are pregnant?" "When was your last menstrual period?"    No dec 18 12. TRAVEL: "Have you traveled out of the country in the last month?" (e.g., travel history, exposures)       [place] in [place]  Protocols used: COUGH - ACUTE PRODUCTIVE-A-AH

## 2018-11-29 NOTE — Telephone Encounter (Signed)
Please advise 

## 2018-11-29 NOTE — Telephone Encounter (Signed)
Encouraged increased rest and hydration, add probiotics, zinc such as Coldeze or Xicam. Treat fevers as needed with Vitamin C 1000 mg daily, plain Mucinex bid and Elderberry. Call in Amoxicillin 500 mg po tid x 7 days, disp #21

## 2018-11-30 MED ORDER — AMOXICILLIN 500 MG PO CAPS: 500.0000 mg | ORAL_CAPSULE | Freq: Three times a day (TID) | ORAL | 0 refills | Status: DC

## 2018-11-30 NOTE — Telephone Encounter (Signed)
Sent medication in  Left detailed message for patient

## 2018-11-30 NOTE — Addendum Note (Signed)
Addended by: Crissie SicklesARTER, Ivyrose Hashman A on: 11/30/2018 09:14 AM   Modules accepted: Orders

## 2019-01-06 DIAGNOSIS — Z01419 Encounter for gynecological examination (general) (routine) without abnormal findings: Secondary | ICD-10-CM | POA: Diagnosis not present

## 2019-01-06 DIAGNOSIS — Z6835 Body mass index (BMI) 35.0-35.9, adult: Secondary | ICD-10-CM | POA: Diagnosis not present

## 2019-01-25 ENCOUNTER — Other Ambulatory Visit: Payer: Self-pay | Admitting: Family Medicine

## 2019-03-02 ENCOUNTER — Other Ambulatory Visit: Payer: Self-pay | Admitting: Family Medicine

## 2019-05-25 DIAGNOSIS — H40011 Open angle with borderline findings, low risk, right eye: Secondary | ICD-10-CM | POA: Diagnosis not present

## 2019-05-25 DIAGNOSIS — H5203 Hypermetropia, bilateral: Secondary | ICD-10-CM | POA: Diagnosis not present

## 2019-05-25 DIAGNOSIS — Z1231 Encounter for screening mammogram for malignant neoplasm of breast: Secondary | ICD-10-CM | POA: Diagnosis not present

## 2019-05-25 DIAGNOSIS — H524 Presbyopia: Secondary | ICD-10-CM | POA: Diagnosis not present

## 2019-06-02 ENCOUNTER — Other Ambulatory Visit: Payer: Self-pay | Admitting: Family Medicine

## 2019-06-14 ENCOUNTER — Ambulatory Visit (INDEPENDENT_AMBULATORY_CARE_PROVIDER_SITE_OTHER): Payer: BC Managed Care – PPO | Admitting: Family Medicine

## 2019-06-14 ENCOUNTER — Other Ambulatory Visit: Payer: Self-pay

## 2019-06-14 DIAGNOSIS — M791 Myalgia, unspecified site: Secondary | ICD-10-CM | POA: Diagnosis not present

## 2019-06-14 DIAGNOSIS — R42 Dizziness and giddiness: Secondary | ICD-10-CM | POA: Insufficient documentation

## 2019-06-14 DIAGNOSIS — E782 Mixed hyperlipidemia: Secondary | ICD-10-CM | POA: Diagnosis not present

## 2019-06-14 MED ORDER — FAMOTIDINE 20 MG PO TABS: 20.0000 mg | ORAL_TABLET | Freq: Two times a day (BID) | ORAL | 5 refills | Status: DC | PRN

## 2019-06-14 MED ORDER — ALPRAZOLAM 0.25 MG PO TABS: ORAL_TABLET | ORAL | 1 refills | Status: DC

## 2019-06-14 NOTE — Assessment & Plan Note (Signed)
Encouraged heart healthy diet, increase exercise, avoid trans fats, consider a krill oil cap daily 

## 2019-06-14 NOTE — Progress Notes (Signed)
Virtual Visit via Video Note  I connected with Brandi BondsJennifer M Weiland on 06/14/19 at  9:40 AM EDT by a video enabled telemedicine application and verified that I am speaking with the correct person using two identifiers.  Location: Patient: home Provider: office   I discussed the limitations of evaluation and management by telemedicine and the availability of in person appointments. The patient expressed understanding and agreed to proceed. Princess Montez MoritaCarter CMA was able to get patient set up on video visit    Subjective:    Patient ID: [name], female    DOB: 04/01/1971, 48 y.o.   MRN: [id number]  No chief complaint on file.   HPI Patient is in today for evaluation of some cramping she is noting in 2 fingers in her right hand, the thumb and index finger and some episodes of feeling dizzy and light headed usually upon arising recently. No recent febrile illness or hospitalizations. No other neurologic symptoms. No falls. She acknowledges not drinking adequate clear fluids. Denies CP/palp/SOB/HA/congestion/fevers/GI or GU c/o. Taking meds as prescribed  Past Medical History:  Diagnosis Date   Acute upper respiratory infections of unspecified site 08/21/2014   Anxiety    Anxiety and depression 10/27/2011   Granuloma annulare    bx confirmed, improved   Hx: UTI (urinary tract infection)    in childhood, resolved with urethral stretching   Hyperlipidemia 04/01/2017   Left flank pain 08/07/2013   Migraine 06/22/2017   Obesity 09/03/2015   Palpitations    Preventative health care 08/26/2011   Toe pain, right 04/07/2012   URI (upper respiratory infection) 01/09/2013    Past Surgical History:  Procedure Laterality Date   BUNIONECTOMY     left   CESAREAN SECTION     CHOLECYSTECTOMY     TONSILLECTOMY     TUBAL LIGATION     URETHRAL STRICTURE DILATATION     childhood    Family History  Problem Relation Age of Onset   Hypertension Mother    Depression  Father        suicide   Hypertension Father    COPD Father        smoker   Alzheimer's disease Maternal Grandmother    Alzheimer's disease Maternal Grandfather    Heart disease Paternal Grandfather        MI   Hypertension Brother     Social History   Socioeconomic History   Marital status: Married    Spouse name: Not on file   Number of children: Not on file   Years of education: Not on file   Highest education level: Not on file  Occupational History   Not on file  Social Needs   Financial resource strain: Not on file   Food insecurity    Worry: Not on file    Inability: Not on file   Transportation needs    Medical: Not on file    Non-medical: Not on file  Tobacco Use   Smoking status: Never Smoker   Smokeless tobacco: Never Used  Substance and Sexual Activity   Alcohol use: No   Drug use: No   Sexual activity: Yes    Comment: 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  Lifestyle   Physical activity    Days per week: Not on file    Minutes per session: Not on file   Stress: Not on file  Relationships   Social connections    Talks on phone:  Not on file    Gets together: Not on file    Attends religious service: Not on file    Active member of club or organization: Not on file    Attends meetings of clubs or organizations: Not on file    Relationship status: Not on file   Intimate partner violence    Fear of current or ex partner: Not on file    Emotionally abused: Not on file    Physically abused: Not on file    Forced sexual activity: Not on file  Other Topics Concern   Not on file  Social History Narrative   Not on file    Outpatient Medications Prior to Visit  Medication Sig Dispense Refill   Diclofenac Sodium (PENNSAID) 2 % SOLN Place 2 application onto the skin 2 (two) times daily. 112 g 3   escitalopram (LEXAPRO) 20 MG tablet TAKE 1 TABLET BY MOUTH EVERY DAY 90 tablet 0    levothyroxine (SYNTHROID, LEVOTHROID) 25 MCG tablet TAKE 1 TABLET (25 MCG TOTAL) BY MOUTH DAILY BEFORE BREAKFAST. 90 tablet 1   ondansetron (ZOFRAN) 4 MG tablet Take 1 tablet (4 mg total) by mouth every 8 (eight) hours as needed for nausea or vomiting. 20 tablet 1   ALPRAZolam (XANAX) 0.25 MG tablet TAKE 1 TABLET BY MOUTH TWICE A DAY AS NEEDED 40 tablet 0   amoxicillin (AMOXIL) 500 MG capsule Take 1 capsule (500 mg total) by mouth 3 (three) times daily. 21 capsule 0   benzonatate (TESSALON) 100 MG capsule Take 1 capsule (100 mg total) by mouth 3 (three) times daily as needed for cough. 30 capsule 0   oseltamivir (TAMIFLU) 75 MG capsule Take 1 capsule (75 mg total) by mouth 2 (two) times daily. 10 capsule 0   ranitidine (ZANTAC) 150 MG tablet Take 1 tablet (150 mg total) by mouth 2 (two) times daily as needed for heartburn. 60 tablet 3   No facility-administered medications prior to visit.     Allergies  Allergen Reactions   Contrast Media [Iodinated Diagnostic Agents]     Review of Systems  Constitutional: Positive for malaise/fatigue. Negative for fever.  HENT: Negative for congestion.   Eyes: Negative for blurred vision.  Respiratory: Negative for shortness of breath.   Cardiovascular: Negative for chest pain, palpitations and leg swelling.  Gastrointestinal: Negative for abdominal pain, blood in stool and nausea.  Genitourinary: Negative for dysuria and frequency.  Musculoskeletal: Positive for myalgias. Negative for falls.  Skin: Negative for rash.  Neurological: Positive for dizziness. Negative for loss of consciousness and headaches.  Endo/Heme/Allergies: Negative for environmental allergies.  Psychiatric/Behavioral: Negative for depression. The patient is not nervous/anxious.        Objective:    Physical Exam Constitutional:      Appearance: Normal appearance. She is not ill-appearing.  HENT:     Head: Normocephalic and atraumatic.     Nose: Nose normal.  Eyes:       General:        Right eye: No discharge.        Left eye: No discharge.  Pulmonary:     Effort: Pulmonary effort is normal.  Neurological:     Mental Status: She is alert and oriented to person, place, and time.  Psychiatric:        Mood and Affect: Mood normal.        Behavior: Behavior normal.     There were no vitals taken for this visit. Wt Readings from Last  3 Encounters:  11/26/18 220 lb (99.8 kg)  12/22/17 211 lb 6.4 oz (95.9 kg)  10/22/17 206 lb (93.4 kg)    Diabetic Foot Exam - Simple   No data filed     Lab Results  Component Value Date   WBC 7.2 12/22/2017   HGB 12.8 12/22/2017   HCT 38.1 12/22/2017   PLT 294.0 12/22/2017   GLUCOSE 85 12/22/2017   CHOL 156 12/22/2017   TRIG 100.0 12/22/2017   HDL 52.00 12/22/2017   LDLCALC 84 12/22/2017   ALT 13 12/22/2017   AST 17 12/22/2017   NA 137 12/22/2017   K 3.9 12/22/2017   CL 103 12/22/2017   CREATININE 0.98 12/22/2017   BUN 20 12/22/2017   CO2 29 12/22/2017   TSH 3.67 02/25/2018    Lab Results  Component Value Date   TSH 3.67 02/25/2018   Lab Results  Component Value Date   WBC 7.2 12/22/2017   HGB 12.8 12/22/2017   HCT 38.1 12/22/2017   MCV 88.2 12/22/2017   PLT 294.0 12/22/2017   Lab Results  Component Value Date   NA 137 12/22/2017   K 3.9 12/22/2017   CO2 29 12/22/2017   GLUCOSE 85 12/22/2017   BUN 20 12/22/2017   CREATININE 0.98 12/22/2017   BILITOT 0.5 12/22/2017   ALKPHOS 58 12/22/2017   AST 17 12/22/2017   ALT 13 12/22/2017   PROT 7.2 12/22/2017   ALBUMIN 4.1 12/22/2017   CALCIUM 9.0 12/22/2017   GFR 64.40 12/22/2017   Lab Results  Component Value Date   CHOL 156 12/22/2017   Lab Results  Component Value Date   HDL 52.00 12/22/2017   Lab Results  Component Value Date   LDLCALC 84 12/22/2017   Lab Results  Component Value Date   TRIG 100.0 12/22/2017   Lab Results  Component Value Date   CHOLHDL 3 12/22/2017   No results found for: HGBA1C     Assessment  & Plan:   Problem List Items Addressed This Visit    Hyperlipidemia    Encouraged heart healthy diet, increase exercise, avoid trans fats, consider a krill oil cap daily      Myalgia    Has been noting some twitching cramp mostly in 2 fingers. Consider electrolyte abnormalities and dehydration. Check labs and hydrate. Report if worsens.       Dizziness    Has noted a few episodes of feeling dizzy and light headed especially upon arising recently. Encouraged to hydrate better, do not skip meals and always need a protein with meals. Check labs report if worsens.          I have discontinued Clearnce Sorrel. Entsminger's ranitidine, oseltamivir, benzonatate, and amoxicillin. I am also having her start on famotidine. Additionally, I am having her maintain her ondansetron, Diclofenac Sodium, levothyroxine, escitalopram, and ALPRAZolam.  Meds ordered this encounter  Medications   ALPRAZolam (XANAX) 0.25 MG tablet    Sig: TAKE 1 TABLET BY MOUTH TWICE A DAY AS NEEDED    Dispense:  40 tablet    Refill:  1    This request is for a new prescription for a controlled substance as required by Federal/State law..   famotidine (PEPCID) 20 MG tablet    Sig: Take 1 tablet (20 mg total) by mouth 2 (two) times daily as needed for heartburn or indigestion.    Dispense:  60 tablet    Refill:  5     I discussed the assessment and treatment  plan with the patient. The patient was provided an opportunity to ask questions and all were answered. The patient agreed with the plan and demonstrated an understanding of the instructions.   The patient was advised to call back or seek an in-person evaluation if the symptoms worsen or if the condition fails to improve as anticipated.  I provided 15 minutes of non-face-to-face time during this encounter.   Danise EdgeStacey Kylyn Mcdade, MD

## 2019-06-14 NOTE — Assessment & Plan Note (Signed)
Has noted a few episodes of feeling dizzy and light headed especially upon arising recently. Encouraged to hydrate better, do not skip meals and always need a protein with meals. Check labs report if worsens.

## 2019-06-14 NOTE — Assessment & Plan Note (Signed)
Has been noting some twitching cramp mostly in 2 fingers. Consider electrolyte abnormalities and dehydration. Check labs and hydrate. Report if worsens.

## 2019-06-16 ENCOUNTER — Encounter: Payer: Self-pay | Admitting: Family Medicine

## 2019-06-20 ENCOUNTER — Other Ambulatory Visit: Payer: Self-pay

## 2019-06-20 ENCOUNTER — Other Ambulatory Visit (INDEPENDENT_AMBULATORY_CARE_PROVIDER_SITE_OTHER): Payer: BC Managed Care – PPO

## 2019-06-20 ENCOUNTER — Encounter: Payer: Self-pay | Admitting: Emergency Medicine

## 2019-06-20 DIAGNOSIS — R42 Dizziness and giddiness: Secondary | ICD-10-CM

## 2019-06-20 DIAGNOSIS — M791 Myalgia, unspecified site: Secondary | ICD-10-CM | POA: Diagnosis not present

## 2019-06-20 DIAGNOSIS — E782 Mixed hyperlipidemia: Secondary | ICD-10-CM | POA: Diagnosis not present

## 2019-06-20 LAB — COMPREHENSIVE METABOLIC PANEL
ALT: 15 U/L (ref 0–35)
AST: 16 U/L (ref 0–37)
Albumin: 3.9 g/dL (ref 3.5–5.2)
Alkaline Phosphatase: 70 U/L (ref 39–117)
BUN: 11 mg/dL (ref 6–23)
CO2: 27 mEq/L (ref 19–32)
Calcium: 8.3 mg/dL — ABNORMAL LOW (ref 8.4–10.5)
Chloride: 104 mEq/L (ref 96–112)
Creatinine, Ser: 0.92 mg/dL (ref 0.40–1.20)
GFR: 64.77 mL/min (ref >60.00)
Glucose, Bld: 84 mg/dL (ref 70–99)
Potassium: 4 mEq/L (ref 3.5–5.1)
Sodium: 139 mEq/L (ref 135–145)
Total Bilirubin: 0.4 mg/dL (ref 0.2–1.2)
Total Protein: 6.6 g/dL (ref 6.0–8.3)

## 2019-06-20 LAB — LDL CHOLESTEROL, DIRECT: Direct LDL: 125 mg/dL

## 2019-06-20 LAB — CBC
HCT: 37.5 % (ref 36.0–46.0)
Hemoglobin: 12.7 g/dL (ref 12.0–15.0)
MCHC: 33.9 g/dL (ref 30.0–36.0)
MCV: 87.3 fl (ref 78.0–100.0)
Platelets: 281 10*3/uL (ref 150.0–400.0)
RBC: 4.29 Mil/uL (ref 3.87–5.11)
RDW: 13.7 % (ref 11.5–15.5)
WBC: 5.2 10*3/uL (ref 4.0–10.5)

## 2019-06-20 LAB — LIPID PANEL
Cholesterol: 187 mg/dL (ref 0–200)
HDL: 42.8 mg/dL (ref >39.00)
NonHDL: 143.85
Total CHOL/HDL Ratio: 4
Triglycerides: 232 mg/dL — ABNORMAL HIGH (ref 0.0–149.0)
VLDL: 46.4 mg/dL — ABNORMAL HIGH (ref 0.0–40.0)

## 2019-06-20 LAB — TSH: TSH: 3.46 u[IU]/mL (ref 0.35–4.50)

## 2019-09-01 ENCOUNTER — Other Ambulatory Visit: Payer: Self-pay | Admitting: Family Medicine

## 2019-09-06 DIAGNOSIS — S93422A Sprain of deltoid ligament of left ankle, initial encounter: Secondary | ICD-10-CM | POA: Diagnosis not present

## 2019-09-06 DIAGNOSIS — M722 Plantar fascial fibromatosis: Secondary | ICD-10-CM | POA: Diagnosis not present

## 2019-09-15 ENCOUNTER — Ambulatory Visit: Payer: BC Managed Care – PPO | Admitting: Family Medicine

## 2019-09-29 ENCOUNTER — Other Ambulatory Visit: Payer: Self-pay

## 2019-09-29 ENCOUNTER — Ambulatory Visit (INDEPENDENT_AMBULATORY_CARE_PROVIDER_SITE_OTHER): Payer: BC Managed Care – PPO

## 2019-09-29 DIAGNOSIS — Z23 Encounter for immunization: Secondary | ICD-10-CM

## 2019-10-18 DIAGNOSIS — M65872 Other synovitis and tenosynovitis, left ankle and foot: Secondary | ICD-10-CM | POA: Diagnosis not present

## 2019-10-18 DIAGNOSIS — M25572 Pain in left ankle and joints of left foot: Secondary | ICD-10-CM | POA: Diagnosis not present

## 2019-10-18 DIAGNOSIS — S93422S Sprain of deltoid ligament of left ankle, sequela: Secondary | ICD-10-CM | POA: Diagnosis not present

## 2019-10-18 DIAGNOSIS — M722 Plantar fascial fibromatosis: Secondary | ICD-10-CM | POA: Diagnosis not present

## 2019-10-26 ENCOUNTER — Encounter: Payer: Self-pay | Admitting: Family Medicine

## 2019-12-20 ENCOUNTER — Ambulatory Visit (INDEPENDENT_AMBULATORY_CARE_PROVIDER_SITE_OTHER): Payer: Self-pay | Admitting: Family Medicine

## 2019-12-20 ENCOUNTER — Other Ambulatory Visit: Payer: Self-pay

## 2019-12-20 DIAGNOSIS — M2550 Pain in unspecified joint: Secondary | ICD-10-CM

## 2019-12-20 MED ORDER — MELOXICAM 15 MG PO TABS: 15.0000 mg | ORAL_TABLET | Freq: Every day | ORAL | 2 refills | Status: DC | PRN

## 2019-12-21 DIAGNOSIS — M2550 Pain in unspecified joint: Secondary | ICD-10-CM | POA: Insufficient documentation

## 2019-12-21 NOTE — Assessment & Plan Note (Signed)
Noted trouble with stiffness and pain in shoulders, elbows, wrists and feet over past few weeks. No redness or warmth. FH of mom with osteoarthritis. Will start with a trial of Meloxicam daily, stay active, get plenty of rest, eat well and exercise. If inadequate response consider lab work and referral

## 2019-12-21 NOTE — Progress Notes (Signed)
Virtual Visit via Video Note  I connected with [name] on 12/20/19 at 11:20 AM EST by a video enabled telemedicine application and verified that I am speaking with the correct person using two identifiers.  Location: Patient: home Provider: home   I discussed the limitations of evaluation and management by telemedicine and the availability of in person appointments. The patient expressed understanding and agreed to proceed. Crissie Sickles, CMA was able to get patient set up on visit, video   Subjective:    Patient ID: [name], female    DOB: 11/16/1971, 49 y.o.   MRN: [id number]  No chief complaint on file.   HPI Patient is in today for evaluation of joint pains. She has been noting weeks worth of persistent joint pain worst in shoulders but also noted in elbows, wrists, and feet. No redness or swelling or warmth. FH of Mom with arthritis, osteo but no other consistent history of arthritis. No recent febrile illness or hospitalization.  Past Medical History:  Diagnosis Date  . Acute upper respiratory infections of unspecified site 08/21/2014  . Anxiety   . Anxiety and depression 10/27/2011  . Granuloma annulare    bx confirmed, improved  . Hx: UTI (urinary tract infection)    in childhood, resolved with urethral stretching  . Hyperlipidemia 04/01/2017  . Left flank pain 08/07/2013  . Migraine 06/22/2017  . Obesity 09/03/2015  . Palpitations   . Preventative health care 08/26/2011  . Toe pain, right 04/07/2012  . URI (upper respiratory infection) 01/09/2013    Past Surgical History:  Procedure Laterality Date  . BUNIONECTOMY     left  . CESAREAN SECTION    . CHOLECYSTECTOMY    . TONSILLECTOMY    . TUBAL LIGATION    . URETHRAL STRICTURE DILATATION     childhood    Family History  Problem Relation Age of Onset  . Hypertension Mother   . Depression Father        suicide  . Hypertension Father   . COPD Father        smoker  . Alzheimer's disease  Maternal Grandmother   . Alzheimer's disease Maternal Grandfather   . Heart disease Paternal Grandfather        MI  . Hypertension Brother     Social History   Socioeconomic History  . Marital status: Married    Spouse name: Not on file  . Number of children: Not on file  . Years of education: Not on file  . Highest education level: Not on file  Occupational History  . Not on file  Tobacco Use  . Smoking status: Never Smoker  . Smokeless tobacco: Never Used  Substance and Sexual Activity  . Alcohol use: No  . Drug use: No  . 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 Not on file  Social History Narrative  . Not on file   Social Determinants of Health   Financial Resource Strain:   . Difficulty of Paying Living Expenses: Not on file  Food Insecurity:   . Worried About Programme researcher, broadcasting/film/video in the Last Year: Not on file  . Ran Out of Food in the Last Year: Not on file  Transportation Needs:   . Lack of Transportation (Medical): Not on file  . Lack of Transportation (Non-Medical): Not on file  Physical Activity:   . Days  of Exercise per Week: Not on file  . Minutes of Exercise per Session: Not on file  Stress:   . Feeling of Stress : Not on file  Social Connections:   . Frequency of Communication with Friends and Family: Not on file  . Frequency of Social Gatherings with Friends and Family: Not on file  . Attends Religious Services: Not on file  . Active Member of Clubs or Organizations: Not on file  . Attends Banker Meetings: Not on file  . Marital Status: Not on file  Intimate Partner Violence:   . Fear of Current or Ex-Partner: Not on file  . Emotionally Abused: Not on file  . Physically Abused: Not on file  . Sexually Abused: Not on file    Outpatient Medications Prior to Visit  Medication Sig Dispense Refill  . ALPRAZolam (XANAX) 0.25 MG tablet TAKE 1  TABLET BY MOUTH TWICE A DAY AS NEEDED 40 tablet 1  . Diclofenac Sodium (PENNSAID) 2 % SOLN Place 2 application onto the skin 2 (two) times daily. 112 g 3  . escitalopram (LEXAPRO) 20 MG tablet TAKE 1 TABLET BY MOUTH EVERY DAY 90 tablet 0  . famotidine (PEPCID) 20 MG tablet Take 1 tablet (20 mg total) by mouth 2 (two) times daily as needed for heartburn or indigestion. 60 tablet 5  . levothyroxine (SYNTHROID, LEVOTHROID) 25 MCG tablet TAKE 1 TABLET (25 MCG TOTAL) BY MOUTH DAILY BEFORE BREAKFAST. 90 tablet 1  . ondansetron (ZOFRAN) 4 MG tablet Take 1 tablet (4 mg total) by mouth every 8 (eight) hours as needed for nausea or vomiting. 20 tablet 1   No facility-administered medications prior to visit.    Allergies  Allergen Reactions  . Contrast Media [Iodinated Diagnostic Agents]     Review of Systems  Constitutional: Negative for fever and malaise/fatigue.  HENT: Negative for congestion.   Eyes: Negative for blurred vision.  Respiratory: Negative for shortness of breath.   Cardiovascular: Negative for chest pain, palpitations and leg swelling.  Gastrointestinal: Negative for abdominal pain, blood in stool and nausea.  Genitourinary: Negative for dysuria and frequency.  Musculoskeletal: Positive for joint pain. Negative for falls.  Skin: Negative for rash.  Neurological: Negative for dizziness, loss of consciousness and headaches.  Endo/Heme/Allergies: Negative for environmental allergies.  Psychiatric/Behavioral: Negative for depression. The patient is not nervous/anxious.        Objective:    Physical Exam Vitals and nursing note reviewed.  Constitutional:      General: She is not in acute distress.    Appearance: Normal appearance. She is well-developed. She is not ill-appearing.  HENT:     Head: Normocephalic and atraumatic.     Nose: Nose normal.  Eyes:     General:        Right eye: No discharge.        Left eye: No discharge.  Cardiovascular:     Rate and Rhythm:  Normal rate and regular rhythm.     Heart sounds: No murmur.  Pulmonary:     Effort: Pulmonary effort is normal.     Breath sounds: Normal breath sounds.  Abdominal:     General: Bowel sounds are normal.     Palpations: Abdomen is soft.     Tenderness: There is no abdominal tenderness.  Musculoskeletal:     Cervical back: Normal range of motion and neck supple.  Skin:    General: Skin is warm and dry.  Neurological:     Mental  Status: She is alert and oriented to person, place, and time.  Psychiatric:        Behavior: Behavior normal.     There were no vitals taken for this visit. Wt Readings from Last 3 Encounters:  11/26/18 220 lb (99.8 kg)  12/22/17 211 lb 6.4 oz (95.9 kg)  10/22/17 206 lb (93.4 kg)    Diabetic Foot Exam - Simple   No data filed     Lab Results  Component Value Date   WBC 5.2 06/20/2019   HGB 12.7 06/20/2019   HCT 37.5 06/20/2019   PLT 281.0 06/20/2019   GLUCOSE 84 06/20/2019   CHOL 187 06/20/2019   TRIG 232.0 (H) 06/20/2019   HDL 42.80 06/20/2019   LDLDIRECT 125.0 06/20/2019   LDLCALC 84 12/22/2017   ALT 15 06/20/2019   AST 16 06/20/2019   NA 139 06/20/2019   K 4.0 06/20/2019   CL 104 06/20/2019   CREATININE 0.92 06/20/2019   BUN 11 06/20/2019   CO2 27 06/20/2019   TSH 3.46 06/20/2019    Lab Results  Component Value Date   TSH 3.46 06/20/2019   Lab Results  Component Value Date   WBC 5.2 06/20/2019   HGB 12.7 06/20/2019   HCT 37.5 06/20/2019   MCV 87.3 06/20/2019   PLT 281.0 06/20/2019   Lab Results  Component Value Date   NA 139 06/20/2019   K 4.0 06/20/2019   CO2 27 06/20/2019   GLUCOSE 84 06/20/2019   BUN 11 06/20/2019   CREATININE 0.92 06/20/2019   BILITOT 0.4 06/20/2019   ALKPHOS 70 06/20/2019   AST 16 06/20/2019   ALT 15 06/20/2019   PROT 6.6 06/20/2019   ALBUMIN 3.9 06/20/2019   CALCIUM 8.3 (L) 06/20/2019   GFR 64.77 06/20/2019   Lab Results  Component Value Date   CHOL 187 06/20/2019   Lab Results   Component Value Date   HDL 42.80 06/20/2019   Lab Results  Component Value Date   LDLCALC 84 12/22/2017   Lab Results  Component Value Date   TRIG 232.0 (H) 06/20/2019   Lab Results  Component Value Date   CHOLHDL 4 06/20/2019   No results found for: HGBA1C     Assessment & Plan:   Problem List Items Addressed This Visit    Arthralgia    Noted trouble with stiffness and pain in shoulders, elbows, wrists and feet over past few weeks. No redness or warmth. FH of mom with osteoarthritis. Will start with a trial of Meloxicam daily, stay active, get plenty of rest, eat well and exercise. If inadequate response consider lab work and referral          I am having [name] start on meloxicam. I am also having her maintain her ondansetron, Diclofenac Sodium, levothyroxine, ALPRAZolam, famotidine, and escitalopram.  Meds ordered this encounter  Medications  . meloxicam (MOBIC) 15 MG tablet    Sig: Take 1 tablet (15 mg total) by mouth daily as needed for pain.    Dispense:  30 tablet    Refill:  2     I discussed the assessment and treatment plan with the patient. The patient was provided an opportunity to ask questions and all were answered. The patient agreed with the plan and demonstrated an understanding of the instructions.   The patient was advised to call back or seek an in-person evaluation if the symptoms worsen or if the condition fails to improve as anticipated.  I provided  10 minutes of non-face-to-face time during this encounter.   Danise Edge, MD

## 2019-12-22 ENCOUNTER — Other Ambulatory Visit: Payer: Self-pay | Admitting: Family Medicine

## 2020-01-01 ENCOUNTER — Other Ambulatory Visit: Payer: Self-pay | Admitting: Family Medicine

## 2020-01-12 DIAGNOSIS — Z01419 Encounter for gynecological examination (general) (routine) without abnormal findings: Secondary | ICD-10-CM | POA: Diagnosis not present

## 2020-01-12 DIAGNOSIS — Z6835 Body mass index (BMI) 35.0-35.9, adult: Secondary | ICD-10-CM | POA: Diagnosis not present

## 2020-02-23 DIAGNOSIS — J069 Acute upper respiratory infection, unspecified: Secondary | ICD-10-CM | POA: Diagnosis not present

## 2020-02-23 DIAGNOSIS — Z03818 Encounter for observation for suspected exposure to other biological agents ruled out: Secondary | ICD-10-CM | POA: Diagnosis not present

## 2020-04-02 ENCOUNTER — Other Ambulatory Visit: Payer: Self-pay | Admitting: Family Medicine

## 2020-04-18 ENCOUNTER — Other Ambulatory Visit: Payer: Self-pay | Admitting: Family Medicine

## 2020-06-15 DIAGNOSIS — Z1231 Encounter for screening mammogram for malignant neoplasm of breast: Secondary | ICD-10-CM | POA: Diagnosis not present

## 2020-06-17 ENCOUNTER — Other Ambulatory Visit: Payer: Self-pay | Admitting: Family Medicine

## 2020-06-20 ENCOUNTER — Other Ambulatory Visit: Payer: Self-pay | Admitting: Obstetrics and Gynecology

## 2020-06-20 DIAGNOSIS — R928 Other abnormal and inconclusive findings on diagnostic imaging of breast: Secondary | ICD-10-CM

## 2020-06-21 ENCOUNTER — Telehealth: Payer: Self-pay | Admitting: Family Medicine

## 2020-06-21 NOTE — Telephone Encounter (Signed)
Patient scheduled to see Ramon Dredge for Cpe on July 26, Patient would like lab order put in before appointment. Patient would like to discuss lab results at appointment.  Please advise

## 2020-06-22 ENCOUNTER — Other Ambulatory Visit: Payer: Self-pay

## 2020-06-22 ENCOUNTER — Ambulatory Visit
Admission: RE | Admit: 2020-06-22 | Discharge: 2020-06-22 | Disposition: A | Discharge status: Home / Self Care | Payer: BC Managed Care – PPO | Source: Ambulatory Visit | Attending: Obstetrics and Gynecology | Admitting: Obstetrics and Gynecology

## 2020-06-22 DIAGNOSIS — N6012 Diffuse cystic mastopathy of left breast: Secondary | ICD-10-CM | POA: Diagnosis not present

## 2020-06-22 DIAGNOSIS — R928 Other abnormal and inconclusive findings on diagnostic imaging of breast: Secondary | ICD-10-CM

## 2020-06-23 ENCOUNTER — Telehealth: Payer: Self-pay | Admitting: Medical

## 2020-06-23 DIAGNOSIS — Z0000 Encounter for general adult medical examination without abnormal findings: Secondary | ICD-10-CM

## 2020-06-23 NOTE — Telephone Encounter (Signed)
Future wellness labs placed.

## 2020-06-23 NOTE — Telephone Encounter (Signed)
I put in future fasting  wellness exam labs. Call pt to get scheduled for labs. She wants labs done prior appointment with me.

## 2020-07-02 ENCOUNTER — Other Ambulatory Visit: Payer: Self-pay

## 2020-07-03 ENCOUNTER — Encounter: Payer: Self-pay | Admitting: Medical

## 2020-07-03 ENCOUNTER — Other Ambulatory Visit: Payer: Self-pay

## 2020-07-03 ENCOUNTER — Ambulatory Visit (INDEPENDENT_AMBULATORY_CARE_PROVIDER_SITE_OTHER): Payer: BC Managed Care – PPO | Admitting: Medical

## 2020-07-03 VITALS — BP 102/60 | HR 79 | Temp 98.1°F | Resp 18 | Ht 68.0 in | Wt 234.0 lb

## 2020-07-03 DIAGNOSIS — E039 Hypothyroidism, unspecified: Secondary | ICD-10-CM

## 2020-07-03 DIAGNOSIS — Z0000 Encounter for general adult medical examination without abnormal findings: Secondary | ICD-10-CM

## 2020-07-03 DIAGNOSIS — Z1211 Encounter for screening for malignant neoplasm of colon: Secondary | ICD-10-CM | POA: Diagnosis not present

## 2020-07-03 LAB — COMPREHENSIVE METABOLIC PANEL
ALT: 28 U/L (ref 0–35)
AST: 26 U/L (ref 0–37)
Albumin: 4 g/dL (ref 3.5–5.2)
Alkaline Phosphatase: 69 U/L (ref 39–117)
BUN: 18 mg/dL (ref 6–23)
CO2: 28 mEq/L (ref 19–32)
Calcium: 8.8 mg/dL (ref 8.4–10.5)
Chloride: 102 mEq/L (ref 96–112)
Creatinine, Ser: 0.92 mg/dL (ref 0.40–1.20)
GFR: 64.5 mL/min (ref >60.00)
Glucose, Bld: 84 mg/dL (ref 70–99)
Potassium: 4.7 mEq/L (ref 3.5–5.1)
Sodium: 136 mEq/L (ref 135–145)
Total Bilirubin: 0.4 mg/dL (ref 0.2–1.2)
Total Protein: 6.8 g/dL (ref 6.0–8.3)

## 2020-07-03 LAB — CBC WITH DIFFERENTIAL/PLATELET
Basophils Absolute: 0 10*3/uL (ref 0.0–0.1)
Basophils Relative: 0.6 % (ref 0.0–3.0)
Eosinophils Absolute: 0.3 10*3/uL (ref 0.0–0.7)
Eosinophils Relative: 4.3 % (ref 0.0–5.0)
HCT: 37.8 % (ref 36.0–46.0)
Hemoglobin: 12.8 g/dL (ref 12.0–15.0)
Lymphocytes Relative: 24.5 % (ref 12.0–46.0)
Lymphs Abs: 1.5 10*3/uL (ref 0.7–4.0)
MCHC: 33.8 g/dL (ref 30.0–36.0)
MCV: 87.5 fl (ref 78.0–100.0)
Monocytes Absolute: 0.5 10*3/uL (ref 0.1–1.0)
Monocytes Relative: 8.5 % (ref 3.0–12.0)
Neutro Abs: 3.9 10*3/uL (ref 1.4–7.7)
Neutrophils Relative %: 62.1 % (ref 43.0–77.0)
Platelets: 244 10*3/uL (ref 150.0–400.0)
RBC: 4.32 Mil/uL (ref 3.87–5.11)
RDW: 13.7 % (ref 11.5–15.5)
WBC: 6.3 10*3/uL (ref 4.0–10.5)

## 2020-07-03 LAB — LIPID PANEL
Cholesterol: 194 mg/dL (ref 0–200)
HDL: 54.1 mg/dL (ref >39.00)
LDL Cholesterol: 122 mg/dL — ABNORMAL HIGH (ref 0–99)
NonHDL: 139.77
Total CHOL/HDL Ratio: 4
Triglycerides: 91 mg/dL (ref 0.0–149.0)
VLDL: 18.2 mg/dL (ref 0.0–40.0)

## 2020-07-03 LAB — TSH: TSH: 2.3 u[IU]/mL (ref 0.35–4.50)

## 2020-07-03 LAB — T4, FREE: Free T4: 0.8 ng/dL (ref 0.60–1.60)

## 2020-07-03 NOTE — Progress Notes (Signed)
Subjective:    Patient ID: [name], female    DOB: 1971-06-17, 49 y.o.   MRN: [id number]  HPI   Pt in for cpe/wellness exam.  Pt is fasting. Pt does not exercise on regular basis, Pt admits her diet is not good. States she knows should change. In past used weight watchers and lost 30 lbs. Pt tried optavia.    Pt has not had colonoscopy.   Pt has not had covid vaccine. Pt is still deciding. Fact that it is not fda approved holding her back.    Review of Systems  Constitutional: Negative for chills, fatigue and fever.  Respiratory: Negative for cough, chest tightness, shortness of breath and wheezing.   Cardiovascular: Negative for chest pain and palpitations.  Gastrointestinal: Negative for abdominal pain, constipation, nausea and vomiting.  Genitourinary: Negative for dysuria.  Musculoskeletal: Negative for back pain, myalgias and neck stiffness.  Skin: Negative for rash.       Small raised are left side of nose.   Neurological: Negative for dizziness, speech difficulty, weakness, numbness and headaches.  Hematological: Negative for adenopathy. Does not bruise/bleed easily.  Psychiatric/Behavioral: Negative for behavioral problems, confusion and sleep disturbance. The patient is not nervous/anxious.     Past Medical History:  Diagnosis Date  . Acute upper respiratory infections of unspecified site 08/21/2014  . Anxiety   . Anxiety and depression 10/27/2011  . Granuloma annulare    bx confirmed, improved  . Hx: UTI (urinary tract infection)    in childhood, resolved with urethral stretching  . Hyperlipidemia 04/01/2017  . Left flank pain 08/07/2013  . Migraine 06/22/2017  . Obesity 09/03/2015  . Palpitations   . Preventative health care 08/26/2011  . Toe pain, right 04/07/2012  . URI (upper respiratory infection) 01/09/2013     Social History   Socioeconomic History  . Marital status: Married    Spouse name: Not on file  . Number of children: Not on  file  . Years of education: Not on file  . Highest education level: Not on file  Occupational History  . Not on file  Tobacco Use  . Smoking status: Never Smoker  . Smokeless tobacco: Never Used  Substance and Sexual Activity  . Alcohol use: No  . Drug use: No  . Sexual activity: Yes    Comment: lives with husband and twins. teaches K at [employer]. no dietary restrictions  Other Topics Concern  . Not on file  Social History Narrative  . Not on file   Social Determinants of Health   Financial Resource Strain:   . Difficulty of Paying Living Expenses:   Food Insecurity:   . Worried About Programme researcher, broadcasting/film/video in the Last Year:   . Barista in the Last Year:   Transportation Needs:   . Freight forwarder (Medical):   Marland Kitchen Lack of Transportation (Non-Medical):   Physical Activity:   . Days of Exercise per Week:   . Minutes of Exercise per Session:   Stress:   . Feeling of Stress :   Social Connections:   . Frequency of Communication with Friends and Family:   . Frequency of Social Gatherings with Friends and Family:   . Attends Religious Services:   . Active Member of Clubs or Organizations:   . Attends Banker Meetings:   Marland Kitchen Marital Status:   Intimate Partner Violence:   . Fear of Current or Ex-Partner:   . Emotionally Abused:   .  Physically Abused:   . Sexually Abused:     Past Surgical History:  Procedure Laterality Date  . BUNIONECTOMY     left  . CESAREAN SECTION    . CHOLECYSTECTOMY    . TONSILLECTOMY    . TUBAL LIGATION    . URETHRAL STRICTURE DILATATION     childhood    Family History  Problem Relation Age of Onset  . Hypertension Mother   . Depression Father        suicide  . Hypertension Father   . COPD Father        smoker  . Alzheimer's disease Maternal Grandmother   . Alzheimer's disease Maternal Grandfather   . Heart disease Paternal Grandfather        MI  . Hypertension Brother     Allergies  Allergen Reactions   . Contrast Media [Iodinated Diagnostic Agents]     Current Outpatient Medications on File Prior to Visit  Medication Sig Dispense Refill  . ALPRAZolam (XANAX) 0.25 MG tablet TAKE 1 TABLET BY MOUTH TWICE A DAY AS NEEDED 40 tablet 1  . escitalopram (LEXAPRO) 20 MG tablet TAKE 1 TABLET BY MOUTH EVERY DAY 90 tablet 0  . levothyroxine (SYNTHROID) 25 MCG tablet TAKE 1 TABLET (25 MCG TOTAL) BY MOUTH DAILY BEFORE BREAKFAST. 90 tablet 1  . meloxicam (MOBIC) 15 MG tablet TAKE 1 TABLET BY MOUTH EVERY DAY AS NEEDED FOR PAIN 30 tablet 2  . Diclofenac Sodium (PENNSAID) 2 % SOLN Place 2 application onto the skin 2 (two) times daily. (Patient not taking: Reported on 07/03/2020) 112 g 3  . famotidine (PEPCID) 20 MG tablet Take 1 tablet (20 mg total) by mouth 2 (two) times daily as needed for heartburn or indigestion. (Patient not taking: Reported on 07/03/2020) 60 tablet 5  . ondansetron (ZOFRAN) 4 MG tablet Take 1 tablet (4 mg total) by mouth every 8 (eight) hours as needed for nausea or vomiting. (Patient not taking: Reported on 07/03/2020) 20 tablet 1   No current facility-administered medications on file prior to visit.    BP (!) 102/60   Pulse 79   Temp 98.1 F (36.7 C) (Oral)   Resp 18   Ht 5\' 8"  (1.727 m)   Wt (!) 234 lb (106.1 kg)   SpO2 97%   BMI 35.58 kg/m       Objective:   Physical Exam  General Mental Status- Alert. General Appearance- Not in acute distress.   Skin General: Color- Normal Color. Moisture- Normal Moisture.  Neck Carotid Arteries- Normal color. Moisture- Normal Moisture. No carotid bruits. No JVD.  Chest and Lung Exam Auscultation: Breath Sounds:-Normal.  Cardiovascular Auscultation:Rythm- Regular. Murmurs & Other Heart Sounds:Auscultation of the heart reveals- No Murmurs.  Abdomen Inspection:-Inspeection Normal. Palpation/Percussion:Note:No mass. Palpation and Percussion of the abdomen reveal- Non Tender, Non Distended + BS, no rebound or  guarding.   Neurologic Cranial Nerve exam:- CN III-XII intact(No nystagmus), symmetric smile. Strength:- 5/5 equal and symmetric strength both upper and lower extremities.  Skin- few scattered moles on back. Freckles. Small raised are on left side of face/neck.    Assessment & Plan:  For you wellness exam today I have ordered cbc, cmp and  lipid panel.  Vaccine appear up to date. She is still waiting on fda approval  Recommend exercise and healthy diet.  We will let you know lab results as they come in.  Follow up date appointment will be determined after lab review.   Referral for colonoscopy placed.  For low thyroid placed tsh and t4 level.  Esperanza Richters, PA-C

## 2020-07-03 NOTE — Patient Instructions (Addendum)
For you wellness exam today I have ordered cbc, cmp and  lipid panel.  Vaccine appear up to date. She is still waiting on fda approval  Recommend exercise and healthy diet.  We will let you know lab results as they come in.  Follow up date appointment will be determined after lab review.   Referral for colonoscopy placed.  For low thyroid placed tsh and t4 level.  Do recommend you keep appointment with dermatologist.   Preventive Care 50-50 Years Old, Female Preventive care refers to visits with your health care provider and lifestyle choices that can promote health and wellness. This includes:  A yearly physical exam. This may also be called an annual well check.  Regular dental visits and eye exams.  Immunizations.  Screening for certain conditions.  Healthy lifestyle choices, such as eating a healthy diet, getting regular exercise, not using drugs or products that contain nicotine and tobacco, and limiting alcohol use. What can I expect for my preventive care visit? Physical exam Your health care provider will check your:  Height and weight. This may be used to calculate body mass index (BMI), which tells if you are at a healthy weight.  Heart rate and blood pressure.  Skin for abnormal spots. Counseling Your health care provider may ask you questions about your:  Alcohol, tobacco, and drug use.  Emotional well-being.  Home and relationship well-being.  Sexual activity.  Eating habits.  Work and work Statistician.  Method of birth control.  Menstrual cycle.  Pregnancy history. What immunizations do I need?  Influenza (flu) vaccine  This is recommended every year. Tetanus, diphtheria, and pertussis (Tdap) vaccine  You may need a Td booster every 10 years. Varicella (chickenpox) vaccine  You may need this if you have not been vaccinated. Zoster (shingles) vaccine  You may need this after age 50. Measles, mumps, and rubella (MMR) vaccine  You  may need at least one dose of MMR if you were born in 1957 or later. You may also need a second dose. Pneumococcal conjugate (PCV13) vaccine  You may need this if you have certain conditions and were not previously vaccinated. Pneumococcal polysaccharide (PPSV23) vaccine  You may need one or two doses if you smoke cigarettes or if you have certain conditions. Meningococcal conjugate (MenACWY) vaccine  You may need this if you have certain conditions. Hepatitis A vaccine  You may need this if you have certain conditions or if you travel or work in places where you may be exposed to hepatitis A. Hepatitis B vaccine  You may need this if you have certain conditions or if you travel or work in places where you may be exposed to hepatitis B. Haemophilus influenzae type b (Hib) vaccine  You may need this if you have certain conditions. Human papillomavirus (HPV) vaccine  If recommended by your health care provider, you may need three doses over 6 months. You may receive vaccines as individual doses or as more than one vaccine together in one shot (combination vaccines). Talk with your health care provider about the risks and benefits of combination vaccines. What tests do I need? Blood tests  Lipid and cholesterol levels. These may be checked every 5 years, or more frequently if you are over 14 years old.  Hepatitis C test.  Hepatitis B test. Screening  Lung cancer screening. You may have this screening every year starting at age 50 if you have a 30-pack-year history of smoking and currently smoke or have quit within  the past 15 years.  Colorectal cancer screening. All adults should have this screening starting at age 50 and continuing until age 31. Your health care provider may recommend screening at age 50 if you are at increased risk. You will have tests every 1-10 years, depending on your results and the type of screening test.  Diabetes screening. This is done by checking your  blood sugar (glucose) after you have not eaten for a while (fasting). You may have this done every 1-3 years.  Mammogram. This may be done every 1-2 years. Talk with your health care provider about when you should start having regular mammograms. This may depend on whether you have a family history of breast cancer.  BRCA-related cancer screening. This may be done if you have a family history of breast, ovarian, tubal, or peritoneal cancers.  Pelvic exam and Pap test. This may be done every 3 years starting at age 50. Starting at age 50, this may be done every 5 years if you have a Pap test in combination with an HPV test. Other tests  Sexually transmitted disease (STD) testing.  Bone density scan. This is done to screen for osteoporosis. You may have this scan if you are at high risk for osteoporosis. Follow these instructions at home: Eating and drinking  Eat a diet that includes fresh fruits and vegetables, whole grains, lean protein, and low-fat dairy.  Take vitamin and mineral supplements as recommended by your health care provider.  Do not drink alcohol if: ? Your health care provider tells you not to drink. ? You are pregnant, may be pregnant, or are planning to become pregnant.  If you drink alcohol: ? Limit how much you have to 0-1 drink a day. ? Be aware of how much alcohol is in your drink. In the [place] one drink equals one 12 oz bottle of beer (355 mL), one 5 oz glass of wine (148 mL), or one 1 oz glass of hard liquor (44 mL). Lifestyle  Take daily care of your teeth and gums.  Stay active. Exercise for at least 30 minutes on 5 or more days each week.  Do not use any products that contain nicotine or tobacco, such as cigarettes, e-cigarettes, and chewing tobacco. If you need help quitting, ask your health care provider.  If you are sexually active, practice safe sex. Use a condom or other form of birth control (contraception) in order to prevent pregnancy and STIs  (sexually transmitted infections).  If told by your health care provider, take low-dose aspirin daily starting at age 71. What's next?  Visit your health care provider once a year for a well check visit.  Ask your health care provider how often you should have your eyes and teeth checked.  Stay up to date on all vaccines. This information is not intended to replace advice given to you by your health care provider. Make sure you discuss any questions you have with your health care provider. Document Revised: 08/05/2018 Document Reviewed: 08/05/2018 Elsevier Patient Education  2020 [employer].

## 2020-07-09 ENCOUNTER — Other Ambulatory Visit: Payer: Self-pay | Admitting: Family Medicine

## 2020-07-27 ENCOUNTER — Other Ambulatory Visit: Payer: Self-pay | Admitting: Family Medicine

## 2020-07-30 DIAGNOSIS — U071 COVID-19: Secondary | ICD-10-CM | POA: Diagnosis not present

## 2020-09-08 ENCOUNTER — Encounter: Payer: Self-pay | Admitting: Family Medicine

## 2020-09-19 DIAGNOSIS — S6391XA Sprain of unspecified part of right wrist and hand, initial encounter: Secondary | ICD-10-CM | POA: Diagnosis not present

## 2020-09-19 DIAGNOSIS — S63501A Unspecified sprain of right wrist, initial encounter: Secondary | ICD-10-CM | POA: Diagnosis not present

## 2020-09-26 DIAGNOSIS — N921 Excessive and frequent menstruation with irregular cycle: Secondary | ICD-10-CM | POA: Diagnosis not present

## 2020-10-02 ENCOUNTER — Encounter: Payer: Self-pay | Admitting: Medical

## 2020-10-03 DIAGNOSIS — S335XXA Sprain of ligaments of lumbar spine, initial encounter: Secondary | ICD-10-CM | POA: Diagnosis not present

## 2020-10-03 DIAGNOSIS — S63501D Unspecified sprain of right wrist, subsequent encounter: Secondary | ICD-10-CM | POA: Diagnosis not present

## 2020-10-17 DIAGNOSIS — M25572 Pain in left ankle and joints of left foot: Secondary | ICD-10-CM | POA: Diagnosis not present

## 2020-10-22 DIAGNOSIS — H5203 Hypermetropia, bilateral: Secondary | ICD-10-CM | POA: Diagnosis not present

## 2020-10-22 DIAGNOSIS — H40011 Open angle with borderline findings, low risk, right eye: Secondary | ICD-10-CM | POA: Diagnosis not present

## 2020-10-29 DIAGNOSIS — M545 Low back pain, unspecified: Secondary | ICD-10-CM | POA: Diagnosis not present

## 2020-11-07 DIAGNOSIS — E7800 Pure hypercholesterolemia, unspecified: Secondary | ICD-10-CM | POA: Diagnosis not present

## 2020-11-07 DIAGNOSIS — E669 Obesity, unspecified: Secondary | ICD-10-CM | POA: Diagnosis not present

## 2020-11-07 DIAGNOSIS — Z8659 Personal history of other mental and behavioral disorders: Secondary | ICD-10-CM | POA: Diagnosis not present

## 2020-11-07 DIAGNOSIS — E039 Hypothyroidism, unspecified: Secondary | ICD-10-CM | POA: Diagnosis not present

## 2020-11-21 DIAGNOSIS — Z713 Dietary counseling and surveillance: Secondary | ICD-10-CM | POA: Diagnosis not present

## 2020-11-21 DIAGNOSIS — E669 Obesity, unspecified: Secondary | ICD-10-CM | POA: Diagnosis not present

## 2020-11-21 DIAGNOSIS — Z6835 Body mass index (BMI) 35.0-35.9, adult: Secondary | ICD-10-CM | POA: Diagnosis not present

## 2020-12-14 ENCOUNTER — Other Ambulatory Visit: Payer: Self-pay | Admitting: Family Medicine

## 2020-12-17 DIAGNOSIS — E669 Obesity, unspecified: Secondary | ICD-10-CM | POA: Diagnosis not present

## 2020-12-17 DIAGNOSIS — Z6836 Body mass index (BMI) 36.0-36.9, adult: Secondary | ICD-10-CM | POA: Diagnosis not present

## 2020-12-17 DIAGNOSIS — Z713 Dietary counseling and surveillance: Secondary | ICD-10-CM | POA: Diagnosis not present

## 2020-12-28 ENCOUNTER — Other Ambulatory Visit: Payer: Self-pay | Admitting: Family Medicine

## 2020-12-28 ENCOUNTER — Other Ambulatory Visit: Payer: Self-pay

## 2020-12-28 ENCOUNTER — Ambulatory Visit: Payer: BC Managed Care – PPO | Admitting: Family Medicine

## 2020-12-28 NOTE — Telephone Encounter (Signed)
Requesting: alprazolam Contract: 12/02/17 UDS: 12/02/17 Last Visit: 07/03/20 Next Visit: 08/27/21 Last Refill: 06/14/19   Please Advise

## 2020-12-31 ENCOUNTER — Ambulatory Visit: Payer: BC Managed Care – PPO | Admitting: Family Medicine

## 2020-12-31 ENCOUNTER — Encounter: Payer: Self-pay | Admitting: Family Medicine

## 2020-12-31 ENCOUNTER — Ambulatory Visit (INDEPENDENT_AMBULATORY_CARE_PROVIDER_SITE_OTHER): Payer: BC Managed Care – PPO | Admitting: Family Medicine

## 2020-12-31 ENCOUNTER — Other Ambulatory Visit (HOSPITAL_COMMUNITY)
Admission: RE | Admit: 2020-12-31 | Discharge: 2020-12-31 | Disposition: A | Discharge status: Home / Self Care | Payer: BC Managed Care – PPO | Source: Ambulatory Visit | Attending: Family Medicine | Admitting: Family Medicine

## 2020-12-31 ENCOUNTER — Other Ambulatory Visit: Payer: Self-pay

## 2020-12-31 VITALS — BP 106/78 | HR 81 | Temp 98.1°F | Resp 12 | Ht 68.0 in | Wt 237.4 lb

## 2020-12-31 DIAGNOSIS — B9689 Other specified bacterial agents as the cause of diseases classified elsewhere: Secondary | ICD-10-CM | POA: Insufficient documentation

## 2020-12-31 DIAGNOSIS — N760 Acute vaginitis: Secondary | ICD-10-CM | POA: Insufficient documentation

## 2020-12-31 DIAGNOSIS — J0140 Acute pansinusitis, unspecified: Secondary | ICD-10-CM | POA: Diagnosis not present

## 2020-12-31 LAB — POC URINALSYSI DIPSTICK (AUTOMATED)
Glucose, UA: NEGATIVE
Ketones, UA: NEGATIVE
Leukocytes, UA: NEGATIVE
Nitrite, UA: NEGATIVE
Protein, UA: POSITIVE — AB
Spec Grav, UA: >=1.03 — AB (ref 1.010–1.025)
Urobilinogen, UA: 0.2 E.U./dL
pH, UA: 5 (ref 5.0–8.0)

## 2020-12-31 MED ORDER — FLUTICASONE PROPIONATE 50 MCG/ACT NA SUSP: 2.0000 | Freq: Every day | NASAL | 6 refills | Status: DC

## 2020-12-31 MED ORDER — METRONIDAZOLE 500 MG PO TABS: 500.0000 mg | ORAL_TABLET | Freq: Two times a day (BID) | ORAL | 0 refills | Status: DC

## 2020-12-31 MED ORDER — CEFDINIR 300 MG PO CAPS: 300.0000 mg | ORAL_CAPSULE | Freq: Two times a day (BID) | ORAL | 0 refills | Status: DC

## 2020-12-31 NOTE — Assessment & Plan Note (Signed)
Swab done and sent off + odor ua checked Flagyl 500 mg bid x 7days

## 2020-12-31 NOTE — Assessment & Plan Note (Signed)
flonase and abx sent to pharmacy 2 rapid home tests neg Not vaccinated for covid  F/u if no improvement

## 2020-12-31 NOTE — Progress Notes (Signed)
Patient ID: [name], female    DOB: 05-Apr-1971  Age: 50 y.o. MRN: [id number]    Subjective:--   Subjective  HPI [name] presents for vaginal odor, no d/c , no dysuria She also c/o sinus congestion and no fever   + tickle in throat  2 covid tests -- rapid -- negative , no cough     Review of Systems  Constitutional: Positive for chills. Negative for fever.  HENT: Positive for congestion, postnasal drip, rhinorrhea and sinus pressure. Negative for ear pain and sore throat.   Respiratory: Negative for cough, chest tightness, shortness of breath and wheezing.   Cardiovascular: Negative for chest pain, palpitations and leg swelling.  Allergic/Immunologic: Negative for environmental allergies.    History Past Medical History:  Diagnosis Date  . Acute upper respiratory infections of unspecified site 08/21/2014  . Anxiety   . Anxiety and depression 10/27/2011  . Granuloma annulare    bx confirmed, improved  . Hx: UTI (urinary tract infection)    in childhood, resolved with urethral stretching  . Hyperlipidemia 04/01/2017  . Left flank pain 08/07/2013  . Migraine 06/22/2017  . Obesity 09/03/2015  . Palpitations   . Preventative health care 08/26/2011  . Toe pain, right 04/07/2012  . URI (upper respiratory infection) 01/09/2013    She has a past surgical history that includes Cesarean section; Cholecystectomy; Tubal ligation; Bunionectomy; Tonsillectomy; and Urethra dilation.   Her family history includes Alzheimer's disease in her maternal grandfather and maternal grandmother; COPD in her father; Depression in her father; Heart disease in her paternal grandfather; Hypertension in her brother, father, and mother.She reports that she has never smoked. She has never used smokeless tobacco. She reports that she does not drink alcohol and does not use drugs.  Current Outpatient Medications on File Prior to Visit  Medication Sig Dispense Refill  . ALPRAZolam  (XANAX) 0.25 MG tablet TAKE 1 TABLET BY MOUTH TWICE A DAY AS NEEDED 40 tablet 1  . escitalopram (LEXAPRO) 20 MG tablet TAKE 1 TABLET BY MOUTH EVERY DAY 90 tablet 1  . levothyroxine (SYNTHROID) 25 MCG tablet Take 1 tablet (25 mcg total) by mouth daily before breakfast. 30 tablet 0   No current facility-administered medications on file prior to visit.     Objective:  Objective  Physical Exam Vitals and nursing note reviewed.  Constitutional:      Appearance: She is well-developed and well-nourished.  HENT:     Head: Normocephalic and atraumatic.  Eyes:     Extraocular Movements: EOM normal.     Conjunctiva/sclera: Conjunctivae normal.  Neck:     Thyroid: No thyromegaly.     Vascular: No carotid bruit or JVD.  Cardiovascular:     Rate and Rhythm: Normal rate and regular rhythm.     Heart sounds: Normal heart sounds. No murmur heard.   Pulmonary:     Effort: Pulmonary effort is normal. No respiratory distress.     Breath sounds: Normal breath sounds. No wheezing or rales.  Chest:     Chest wall: No tenderness.  Genitourinary:    Vagina: No vaginal discharge, tenderness or lesions.     Musculoskeletal:        General: No edema.     Cervical back: Normal range of motion and neck supple.  Neurological:     Mental Status: She is alert and oriented to person, place, and time.  Psychiatric:        Mood and Affect: Mood  and affect normal.    BP 106/78 (BP Location: Right Arm, Cuff Size: Large)   Pulse 81   Temp 98.1 F (36.7 C) (Oral)   Resp 12   Ht 5\' 8"  (1.727 m)   Wt 237 lb 6.4 oz (107.7 kg)   LMP 12/12/2020   SpO2 96%   BMI 36.10 kg/m  Wt Readings from Last 3 Encounters:  12/31/20 237 lb 6.4 oz (107.7 kg)  07/03/20 (!) 234 lb (106.1 kg)  11/26/18 220 lb (99.8 kg)     Lab Results  Component Value Date   WBC 6.3 07/03/2020   HGB 12.8 07/03/2020   HCT 37.8 07/03/2020   PLT 244.0 07/03/2020   GLUCOSE 84 07/03/2020   CHOL 194 07/03/2020   TRIG 91.0  07/03/2020   HDL 54.10 07/03/2020   LDLDIRECT 125.0 06/20/2019   LDLCALC 122 (H) 07/03/2020   ALT 28 07/03/2020   AST 26 07/03/2020   NA 136 07/03/2020   K 4.7 07/03/2020   CL 102 07/03/2020   CREATININE 0.92 07/03/2020   BUN 18 07/03/2020   CO2 28 07/03/2020   TSH 2.30 07/03/2020    07/05/2020 BREAST LTD UNI LEFT INC AXILLA  Result Date: 06/22/2020 CLINICAL DATA:  Recall from screening mammography with tomosynthesis, possible mass involving the UPPER OUTER QUADRANT of the LEFT breast. EXAM: ULTRASOUND OF THE LEFT BREAST COMPARISON:  Previous exam(s). FINDINGS: Targeted ultrasound is performed, showing a benign simple cyst at the 2 o'clock position approximately 10 cm from the nipple at MIDDLE depth measuring approximately 1.0 x 0.8 x 0.9 cm, demonstrating posterior acoustic enhancement and no internal power Doppler flow, corresponding to the screening mammographic finding. At the 2 o'clock position approximately 9 cm from the nipple at POSTERIOR depth is a circumscribed oval parallel anechoic mass with scattered internal echoes measuring approximately 0.8 x 0.5 x 0.8 cm, demonstrating posterior acoustic enhancement and no internal power Doppler flow. No suspicious solid mass or abnormal acoustic shadowing is identified. IMPRESSION: Benign cysts in the UPPER OUTER QUADRANT of the LEFT breast, the largest of which accounts for the screening mammographic finding. RECOMMENDATION: Screening mammogram in one year.(Code:SM-B-01Y) I have discussed the findings and recommendations with the patient. If applicable, a reminder letter will be sent to the patient regarding the next appointment. BI-RADS CATEGORY  2: Benign. Electronically Signed   By: 06/24/2020 M.D.   On: 06/22/2020 15:48     Assessment & Plan:  Plan  I have discontinued 06/24/2020 K. Tercero's ondansetron, Diclofenac Sodium, famotidine, and meloxicam. I am also having her start on metroNIDAZOLE, cefdinir, and fluticasone. Additionally, I am  having her maintain her escitalopram, levothyroxine, and ALPRAZolam.  Meds ordered this encounter  Medications  . metroNIDAZOLE (FLAGYL) 500 MG tablet    Sig: Take 1 tablet (500 mg total) by mouth 2 (two) times daily.    Dispense:  14 tablet    Refill:  0  . cefdinir (OMNICEF) 300 MG capsule    Sig: Take 1 capsule (300 mg total) by mouth 2 (two) times daily.    Dispense:  20 capsule    Refill:  0  . fluticasone (FLONASE) 50 MCG/ACT nasal spray    Sig: Place 2 sprays into both nostrils daily.    Dispense:  16 g    Refill:  6    Problem List Items Addressed This Visit      Unprioritized   Acute non-recurrent pansinusitis    flonase and abx sent to pharmacy 2 rapid home  tests neg Not vaccinated for covid  F/u if no improvement      Relevant Medications   metroNIDAZOLE (FLAGYL) 500 MG tablet   cefdinir (OMNICEF) 300 MG capsule   fluticasone (FLONASE) 50 MCG/ACT nasal spray   Acute vaginitis - Primary    Swab done and sent off + odor ua checked Flagyl 500 mg bid x 7days       Relevant Medications   metroNIDAZOLE (FLAGYL) 500 MG tablet   Other Relevant Orders   Cervicovaginal ancillary only( [facility])      Follow-up: No follow-ups on file.  Donato Schultz, DO

## 2020-12-31 NOTE — Addendum Note (Signed)
Addended by: Thelma Barge D on: 12/31/2020 04:17 PM   Modules accepted: Orders

## 2021-01-01 ENCOUNTER — Ambulatory Visit: Payer: BC Managed Care – PPO | Admitting: Family Medicine

## 2021-01-01 LAB — URINE CULTURE
MICRO NUMBER:: 11448829
SPECIMEN QUALITY:: ADEQUATE

## 2021-01-01 NOTE — Telephone Encounter (Signed)
Dr Abner Greenspan -- please see pts my chart message and advise what to tell pt and when should it be repeated?

## 2021-01-01 NOTE — Telephone Encounter (Signed)
It s somehing that will need to be followed and I would advise her to f/u with Dr Abner Greenspan for repeat

## 2021-01-02 ENCOUNTER — Encounter: Payer: Self-pay | Admitting: Gastroenterology

## 2021-01-02 ENCOUNTER — Other Ambulatory Visit: Payer: Self-pay | Admitting: *Deleted

## 2021-01-02 ENCOUNTER — Other Ambulatory Visit: Payer: Self-pay | Admitting: Family Medicine

## 2021-01-02 DIAGNOSIS — R809 Proteinuria, unspecified: Secondary | ICD-10-CM

## 2021-01-02 DIAGNOSIS — N760 Acute vaginitis: Secondary | ICD-10-CM

## 2021-01-02 LAB — CERVICOVAGINAL ANCILLARY ONLY
Bacterial Vaginitis (gardnerella): POSITIVE — AB
Candida Glabrata: POSITIVE — AB
Candida Vaginitis: NEGATIVE
Comment: NEGATIVE
Comment: NEGATIVE
Comment: NEGATIVE

## 2021-01-02 MED ORDER — FLUCONAZOLE 150 MG PO TABS: ORAL_TABLET | ORAL | 0 refills | Status: DC

## 2021-01-02 NOTE — Telephone Encounter (Signed)
Patient scheduled for 02/01/2021

## 2021-01-20 ENCOUNTER — Other Ambulatory Visit: Payer: Self-pay | Admitting: Family Medicine

## 2021-01-21 ENCOUNTER — Other Ambulatory Visit: Payer: Self-pay | Admitting: Family Medicine

## 2021-02-01 ENCOUNTER — Other Ambulatory Visit: Payer: BC Managed Care – PPO

## 2021-02-01 ENCOUNTER — Other Ambulatory Visit: Payer: Self-pay

## 2021-02-01 ENCOUNTER — Ambulatory Visit (AMBULATORY_SURGERY_CENTER): Payer: BC Managed Care – PPO

## 2021-02-01 VITALS — Ht 67.0 in | Wt 230.0 lb

## 2021-02-01 DIAGNOSIS — Z1211 Encounter for screening for malignant neoplasm of colon: Secondary | ICD-10-CM

## 2021-02-01 MED ORDER — PLENVU 140 G PO SOLR: 1.0000 | ORAL | 0 refills | Status: DC

## 2021-02-01 NOTE — Progress Notes (Signed)
No egg or soy allergy known to patient  No issues with past sedation with any surgeries or procedures---PONV No intubation problems in the past  No FH of Malignant Hyperthermia No diet pills per patient No home 02 use per patient  No blood thinners per patient  Pt denies issues with constipation  No A fib or A flutter  EMMI video via MyChart  COVID 19 guidelines implemented in PV today with Pt and RN  Pt denies loose or missing teeth, dentures, partials, dental implants, or bonded teeth; Patient reports crowns; Coupon given to pt in PV today , Code to Pharmacy and  NO PA's for preps discussed with pt In PV today  Discussed with pt there will be an out-of-pocket cost for prep and that varies from $0 to 70 dollars  Due to the COVID-19 pandemic we are asking patients to follow certain guidelines.   Pt aware of COVID protocols and [facility] guidelines

## 2021-02-07 ENCOUNTER — Encounter: Payer: Self-pay | Admitting: Gastroenterology

## 2021-02-08 ENCOUNTER — Other Ambulatory Visit: Payer: BC Managed Care – PPO

## 2021-02-15 ENCOUNTER — Encounter: Payer: Self-pay | Admitting: Family Medicine

## 2021-02-15 ENCOUNTER — Other Ambulatory Visit: Payer: Self-pay | Admitting: Family Medicine

## 2021-02-15 ENCOUNTER — Ambulatory Visit (AMBULATORY_SURGERY_CENTER): Payer: BC Managed Care – PPO | Admitting: Gastroenterology

## 2021-02-15 ENCOUNTER — Other Ambulatory Visit: Payer: Self-pay

## 2021-02-15 ENCOUNTER — Encounter: Payer: Self-pay | Admitting: Gastroenterology

## 2021-02-15 VITALS — BP 107/60 | HR 73 | Temp 97.8°F | Resp 15 | Ht 67.0 in | Wt 230.0 lb

## 2021-02-15 DIAGNOSIS — Z1211 Encounter for screening for malignant neoplasm of colon: Secondary | ICD-10-CM | POA: Diagnosis not present

## 2021-02-15 DIAGNOSIS — N760 Acute vaginitis: Secondary | ICD-10-CM

## 2021-02-15 MED ORDER — METRONIDAZOLE 500 MG PO TABS: 500.0000 mg | ORAL_TABLET | Freq: Two times a day (BID) | ORAL | 0 refills | Status: DC

## 2021-02-15 MED ORDER — SODIUM CHLORIDE 0.9 % IV SOLN: 500.0000 mL | INTRAVENOUS | Status: DC

## 2021-02-15 MED ORDER — FLUCONAZOLE 150 MG PO TABS: 150.0000 mg | ORAL_TABLET | ORAL | 1 refills | Status: DC

## 2021-02-15 NOTE — Progress Notes (Signed)
Report to PACU, RN, vss, BBS= Clear.  

## 2021-02-15 NOTE — Progress Notes (Signed)
No problems noted in the recovery room. maw 

## 2021-02-15 NOTE — Op Note (Signed)
[facility] Patient Name: [name] Procedure Date: 02/15/2021 9:52 AM MRN: [id number] Endoscopist: Doristine Locks , MD Age: 50 Referring MD:  Date of Birth: 20-Jul-1971 Gender: Female Account #: [id number] Procedure:                Colonoscopy Indications:              Screening for colorectal malignant neoplasm (last                            colonoscopy was more than 10 years ago)                           Family history notable for father with colon polyps.                           Last colonoscopy was 08/2008 and was normal. She is                            otherwise without GI symptoms. Medicines:                Monitored Anesthesia Care Procedure:                Pre-Anesthesia Assessment:                           - Prior to the procedure, a History and Physical                            was performed, and patient medications and                            allergies were reviewed. The patient's tolerance of                            previous anesthesia was also reviewed. The risks                            and benefits of the procedure and the sedation                            options and risks were discussed with the patient.                            All questions were answered, and informed consent                            was obtained. Prior Anticoagulants: The patient has                            taken no previous anticoagulant or antiplatelet                            agents. ASA Grade Assessment: II - A patient with  mild systemic disease. After reviewing the risks                            and benefits, the patient was deemed in                            satisfactory condition to undergo the procedure.                           After obtaining informed consent, the colonoscope                            was passed under direct vision. Throughout the                            procedure, the patient's blood  pressure, pulse, and                            oxygen saturations were monitored continuously. The                            Olympus CF-HQ190 [id number]) Colonoscope was                            introduced through the anus and advanced to the the                            cecum, identified by appendiceal orifice and                            ileocecal valve. The colonoscopy was performed                            without difficulty. The patient tolerated the                            procedure well. The quality of the bowel                            preparation was excellent. The terminal ileum,                            ileocecal valve, appendiceal orifice, and rectum                            were photographed. Scope In: 9:59:29 AM Scope Out: 10:16:07 AM Scope Withdrawal Time: 0 hours 10 minutes 9 seconds  Total Procedure Duration: 0 hours 16 minutes 38 seconds  Findings:                 The perianal and digital rectal examinations were                            normal.  The colon (entire examined portion) appeared normal.                           The retroflexed view of the distal rectum and anal                            verge was normal and showed no anal or rectal                            abnormalities. Complications:            No immediate complications. Estimated Blood Loss:     Estimated blood loss: none. Impression:               - The entire examined colon is normal.                           - The distal rectum and anal verge are normal on                            retroflexion view.                           - No specimens collected. Recommendation:           - Patient has a contact number available for                            emergencies. The signs and symptoms of potential                            delayed complications were discussed with the                            patient. Return to normal activities tomorrow.                             Written discharge instructions were provided to the                            patient.                           - Resume previous diet.                           - Continue present medications.                           - Repeat colonoscopy in 10 years for screening                            purposes.                           - Return to GI office PRN. Doristine Locks, MD 02/15/2021 10:19:55 AM

## 2021-02-15 NOTE — Patient Instructions (Addendum)
You may resume your current medications today. Repeat colonoscopy for screening purposes in 10 years. Please call if any questions or concerns.      YOU HAD AN ENDOSCOPIC PROCEDURE TODAY AT THE [facility]:   Refer to the procedure report that was given to you for any specific questions about what was found during the examination.  If the procedure report does not answer your questions, please call your gastroenterologist to clarify.  If you requested that your care partner not be given the details of your procedure findings, then the procedure report has been included in a sealed envelope for you to review at your convenience later.  YOU SHOULD EXPECT: Some feelings of bloating in the abdomen. Passage of more gas than usual.  Walking can help get rid of the air that was put into your GI tract during the procedure and reduce the bloating. If you had a lower endoscopy (such as a colonoscopy or flexible sigmoidoscopy) you may notice spotting of blood in your stool or on the toilet paper. If you underwent a bowel prep for your procedure, you may not have a normal bowel movement for a few days.  Please Note:  You might notice some irritation and congestion in your nose or some drainage.  This is from the oxygen used during your procedure.  There is no need for concern and it should clear up in a day or so.  SYMPTOMS TO REPORT IMMEDIATELY:  Following lower endoscopy (colonoscopy or flexible sigmoidoscopy):  Excessive amounts of blood in the stool  Significant tenderness or worsening of abdominal pains  Swelling of the abdomen that is new, acute  Fever of 100F or higher   For urgent or emergent issues, a gastroenterologist can be reached at any hour by calling [phone]. Do not use MyChart messaging for urgent concerns.    DIET:  We do recommend a small meal at first, but then you may proceed to your regular diet.  Drink plenty of fluids but you should avoid alcoholic  beverages for 24 hours.  ACTIVITY:  You should plan to take it easy for the rest of today and you should NOT DRIVE or use heavy machinery until tomorrow (because of the sedation medicines used during the test).    FOLLOW UP: Our staff will call the number listed on your records 48-72 hours following your procedure to check on you and address any questions or concerns that you may have regarding the information given to you following your procedure. If we do not reach you, we will leave a message.  We will attempt to reach you two times.  During this call, we will ask if you have developed any symptoms of COVID 19. If you develop any symptoms (ie: fever, flu-like symptoms, shortness of breath, cough etc.) before then, please call [phone].  If you test positive for Covid 19 in the 2 weeks post procedure, please call and report this information to us.    If any biopsies were taken you will be contacted by phone or by letter within the next 1-3 weeks.  Please call us at [phone] if you have not heard about the biopsies in 3 weeks.    SIGNATURES/CONFIDENTIALITY: You and/or your care partner have signed paperwork which will be entered into your electronic medical record.  These signatures attest to the fact that that the information above on your After Visit Summary has been reviewed and is understood.  Full responsibility of the confidentiality of this   this discharge information lies with you and/or your care-partner.

## 2021-02-18 ENCOUNTER — Other Ambulatory Visit (INDEPENDENT_AMBULATORY_CARE_PROVIDER_SITE_OTHER): Payer: BC Managed Care – PPO

## 2021-02-18 ENCOUNTER — Other Ambulatory Visit: Payer: Self-pay

## 2021-02-18 DIAGNOSIS — R809 Proteinuria, unspecified: Secondary | ICD-10-CM

## 2021-02-18 LAB — POC URINALSYSI DIPSTICK (AUTOMATED)
Bilirubin, UA: POSITIVE
Blood, UA: NEGATIVE
Glucose, UA: NEGATIVE
Nitrite, UA: NEGATIVE
Protein, UA: POSITIVE — AB
Spec Grav, UA: >=1.03 — AB (ref 1.010–1.025)
Urobilinogen, UA: 0.2 E.U./dL
pH, UA: 5.5 (ref 5.0–8.0)

## 2021-02-19 LAB — COMPREHENSIVE METABOLIC PANEL
ALT: 27 U/L (ref 0–35)
AST: 27 U/L (ref 0–37)
Albumin: 4 g/dL (ref 3.5–5.2)
Alkaline Phosphatase: 63 U/L (ref 39–117)
BUN: 14 mg/dL (ref 6–23)
CO2: 30 mEq/L (ref 19–32)
Calcium: 9.1 mg/dL (ref 8.4–10.5)
Chloride: 101 mEq/L (ref 96–112)
Creatinine, Ser: 0.91 mg/dL (ref 0.40–1.20)
GFR: 73.26 mL/min (ref >60.00)
Glucose, Bld: 73 mg/dL (ref 70–99)
Potassium: 4 mEq/L (ref 3.5–5.1)
Sodium: 137 mEq/L (ref 135–145)
Total Bilirubin: 0.5 mg/dL (ref 0.2–1.2)
Total Protein: 6.8 g/dL (ref 6.0–8.3)

## 2021-02-19 LAB — URINE CULTURE
MICRO NUMBER:: 11643562
SPECIMEN QUALITY:: ADEQUATE

## 2021-02-20 ENCOUNTER — Telehealth: Payer: Self-pay

## 2021-02-20 NOTE — Telephone Encounter (Signed)
LVM

## 2021-03-01 ENCOUNTER — Encounter: Payer: BC Managed Care – PPO | Admitting: Gastroenterology

## 2021-03-26 DIAGNOSIS — C44519 Basal cell carcinoma of skin of other part of trunk: Secondary | ICD-10-CM | POA: Diagnosis not present

## 2021-03-26 DIAGNOSIS — L821 Other seborrheic keratosis: Secondary | ICD-10-CM | POA: Diagnosis not present

## 2021-04-02 DIAGNOSIS — Z6837 Body mass index (BMI) 37.0-37.9, adult: Secondary | ICD-10-CM | POA: Diagnosis not present

## 2021-04-02 DIAGNOSIS — Z01419 Encounter for gynecological examination (general) (routine) without abnormal findings: Secondary | ICD-10-CM | POA: Diagnosis not present

## 2021-04-02 DIAGNOSIS — N760 Acute vaginitis: Secondary | ICD-10-CM | POA: Diagnosis not present

## 2021-06-03 ENCOUNTER — Encounter: Payer: Self-pay | Admitting: Family Medicine

## 2021-07-22 DIAGNOSIS — M25512 Pain in left shoulder: Secondary | ICD-10-CM | POA: Diagnosis not present

## 2021-08-10 ENCOUNTER — Other Ambulatory Visit: Payer: Self-pay | Admitting: Family Medicine

## 2021-08-18 DIAGNOSIS — J020 Streptococcal pharyngitis: Secondary | ICD-10-CM | POA: Diagnosis not present

## 2021-08-19 ENCOUNTER — Encounter: Payer: Self-pay | Admitting: Family Medicine

## 2021-08-19 ENCOUNTER — Other Ambulatory Visit: Payer: Self-pay | Admitting: Family Medicine

## 2021-08-19 DIAGNOSIS — N760 Acute vaginitis: Secondary | ICD-10-CM

## 2021-08-19 MED ORDER — FLUCONAZOLE 150 MG PO TABS: 150.0000 mg | ORAL_TABLET | ORAL | 1 refills | Status: DC

## 2021-08-26 ENCOUNTER — Other Ambulatory Visit: Payer: Self-pay | Admitting: Orthopedic Surgery

## 2021-08-26 ENCOUNTER — Other Ambulatory Visit: Payer: Self-pay

## 2021-08-26 DIAGNOSIS — M25512 Pain in left shoulder: Secondary | ICD-10-CM

## 2021-08-27 ENCOUNTER — Ambulatory Visit (INDEPENDENT_AMBULATORY_CARE_PROVIDER_SITE_OTHER): Payer: BC Managed Care – PPO | Admitting: Family Medicine

## 2021-08-27 ENCOUNTER — Encounter: Payer: Self-pay | Admitting: Family Medicine

## 2021-08-27 VITALS — BP 112/74 | HR 77 | Temp 98.1°F | Resp 16 | Ht 67.0 in | Wt 233.6 lb

## 2021-08-27 DIAGNOSIS — M25512 Pain in left shoulder: Secondary | ICD-10-CM

## 2021-08-27 DIAGNOSIS — Z23 Encounter for immunization: Secondary | ICD-10-CM | POA: Diagnosis not present

## 2021-08-27 DIAGNOSIS — M25511 Pain in right shoulder: Secondary | ICD-10-CM | POA: Diagnosis not present

## 2021-08-27 DIAGNOSIS — E039 Hypothyroidism, unspecified: Secondary | ICD-10-CM

## 2021-08-27 DIAGNOSIS — B9689 Other specified bacterial agents as the cause of diseases classified elsewhere: Secondary | ICD-10-CM

## 2021-08-27 DIAGNOSIS — F419 Anxiety disorder, unspecified: Secondary | ICD-10-CM

## 2021-08-27 DIAGNOSIS — Z0000 Encounter for general adult medical examination without abnormal findings: Secondary | ICD-10-CM

## 2021-08-27 DIAGNOSIS — Z79899 Other long term (current) drug therapy: Secondary | ICD-10-CM

## 2021-08-27 DIAGNOSIS — K219 Gastro-esophageal reflux disease without esophagitis: Secondary | ICD-10-CM | POA: Diagnosis not present

## 2021-08-27 DIAGNOSIS — E6609 Other obesity due to excess calories: Secondary | ICD-10-CM

## 2021-08-27 DIAGNOSIS — E782 Mixed hyperlipidemia: Secondary | ICD-10-CM | POA: Diagnosis not present

## 2021-08-27 DIAGNOSIS — F32A Depression, unspecified: Secondary | ICD-10-CM

## 2021-08-27 DIAGNOSIS — N760 Acute vaginitis: Secondary | ICD-10-CM

## 2021-08-27 LAB — TSH: TSH: 2.84 u[IU]/mL (ref 0.35–5.50)

## 2021-08-27 LAB — COMPREHENSIVE METABOLIC PANEL
ALT: 18 U/L (ref 0–35)
AST: 20 U/L (ref 0–37)
Albumin: 4.1 g/dL (ref 3.5–5.2)
Alkaline Phosphatase: 76 U/L (ref 39–117)
BUN: 15 mg/dL (ref 6–23)
CO2: 31 mEq/L (ref 19–32)
Calcium: 9.3 mg/dL (ref 8.4–10.5)
Chloride: 102 mEq/L (ref 96–112)
Creatinine, Ser: 0.99 mg/dL (ref 0.40–1.20)
GFR: 65.97 mL/min (ref >60.00)
Glucose, Bld: 87 mg/dL (ref 70–99)
Potassium: 4.4 mEq/L (ref 3.5–5.1)
Sodium: 139 mEq/L (ref 135–145)
Total Bilirubin: 0.6 mg/dL (ref 0.2–1.2)
Total Protein: 7.4 g/dL (ref 6.0–8.3)

## 2021-08-27 LAB — CBC WITH DIFFERENTIAL/PLATELET
Basophils Absolute: 0 10*3/uL (ref 0.0–0.1)
Basophils Relative: 0.7 % (ref 0.0–3.0)
Eosinophils Absolute: 0.1 10*3/uL (ref 0.0–0.7)
Eosinophils Relative: 2.5 % (ref 0.0–5.0)
HCT: 37.7 % (ref 36.0–46.0)
Hemoglobin: 12.7 g/dL (ref 12.0–15.0)
Lymphocytes Relative: 33.5 % (ref 12.0–46.0)
Lymphs Abs: 1.8 10*3/uL (ref 0.7–4.0)
MCHC: 33.7 g/dL (ref 30.0–36.0)
MCV: 85.5 fl (ref 78.0–100.0)
Monocytes Absolute: 0.4 10*3/uL (ref 0.1–1.0)
Monocytes Relative: 8.3 % (ref 3.0–12.0)
Neutro Abs: 2.9 10*3/uL (ref 1.4–7.7)
Neutrophils Relative %: 55 % (ref 43.0–77.0)
Platelets: 313 10*3/uL (ref 150.0–400.0)
RBC: 4.41 Mil/uL (ref 3.87–5.11)
RDW: 14.2 % (ref 11.5–15.5)
WBC: 5.3 10*3/uL (ref 4.0–10.5)

## 2021-08-27 LAB — LIPID PANEL
Cholesterol: 199 mg/dL (ref 0–200)
HDL: 51.4 mg/dL (ref >39.00)
LDL Cholesterol: 127 mg/dL — ABNORMAL HIGH (ref 0–99)
NonHDL: 147.15
Total CHOL/HDL Ratio: 4
Triglycerides: 100 mg/dL (ref 0.0–149.0)
VLDL: 20 mg/dL (ref 0.0–40.0)

## 2021-08-27 MED ORDER — OMEPRAZOLE 20 MG PO CPDR: 20.0000 mg | DELAYED_RELEASE_CAPSULE | Freq: Every day | ORAL | 1 refills | Status: DC | PRN

## 2021-08-27 MED ORDER — ALPRAZOLAM 0.25 MG PO TABS: 0.2500 mg | ORAL_TABLET | Freq: Two times a day (BID) | ORAL | 1 refills | Status: DC | PRN

## 2021-08-27 NOTE — Patient Instructions (Addendum)
Shingrix is the new shingles shot, 2 shots over 2-6 months, confirm coverage with insurance and document, then can return here for shots with nurse appt or at pharmacy    Paxlovid or Molnupiravir is the new COVID medication we can give you if you get COVID so make sure you test if you have symptoms because we have to treat by day 5 of symptoms for it to be effective. If you are positive let us know so we can treat. If a home test is negative and your symptoms are persistent get a PCR test. Can check testing locations at [email or web address] If you are positive we will make an appointment with [place] and we will send in Paxlovid if you would like it. Check with your pharmacy before we meet to confirm they have it in stock, if they do not then we can get the prescription at the [facility] down stairs or on [place] or [email or web address] Boric Acid suppositories once a week x 4 weeks then monthly   Consider Wegovy or Saxenda injections for weight loss and check insurance regarding and pharmacy regarding availability  Preventive Care 68-75 Years Old, Female Preventive care refers to lifestyle choices and visits with your health care provider that can promote health and wellness. This includes: A yearly physical exam. This is also called an annual wellness visit. Regular dental and eye exams. Immunizations. Screening for certain conditions. Healthy lifestyle choices, such as: Eating a healthy diet. Getting regular exercise. Not using drugs or products that contain nicotine and tobacco. Limiting alcohol use. What can I expect for my preventive care visit? Physical exam Your health care provider will check your: Height and weight. These may be used to calculate your BMI (body mass index). BMI is a measurement that tells if you are at a healthy weight. Heart rate and blood pressure. Body temperature. Skin for abnormal spots. Counseling Your health care provider may ask you  questions about your: Past medical problems. Family's medical history. Alcohol, tobacco, and drug use. Emotional well-being. Home life and relationship well-being. Sexual activity. Diet, exercise, and sleep habits. Work and work Statistician. Access to firearms. Method of birth control. Menstrual cycle. Pregnancy history. What immunizations do I need? Vaccines are usually given at various ages, according to a schedule. Your health care provider will recommend vaccines for you based on your age, medical history, and lifestyle or other factors, such as travel or where you work. What tests do I need? Blood tests Lipid and cholesterol levels. These may be checked every 5 years, or more often if you are over 61 years old. Hepatitis C test. Hepatitis B test. Screening Lung cancer screening. You may have this screening every year starting at age 74 if you have a 30-pack-year history of smoking and currently smoke or have quit within the past 15 years. Colorectal cancer screening. All adults should have this screening starting at age 52 and continuing until age 78. Your health care provider may recommend screening at age 63 if you are at increased risk. You will have tests every 1-10 years, depending on your results and the type of screening test. Diabetes screening. This is done by checking your blood sugar (glucose) after you have not eaten for a while (fasting). You may have this done every 1-3 years. Mammogram. This may be done every 1-2 years. Talk with your health care provider about when you should start having regular mammograms. This may depend on whether you have a family  history of breast cancer. BRCA-related cancer screening. This may be done if you have a family history of breast, ovarian, tubal, or peritoneal cancers. Pelvic exam and Pap test. This may be done every 3 years starting at age 64. Starting at age 73, this may be done every 5 years if you have a Pap test in  combination with an HPV test. Other tests STD (sexually transmitted disease) testing, if you are at risk. Bone density scan. This is done to screen for osteoporosis. You may have this scan if you are at high risk for osteoporosis. Talk with your health care provider about your test results, treatment options, and if necessary, the need for more tests. Follow these instructions at home: Eating and drinking  Eat a diet that includes fresh fruits and vegetables, whole grains, lean protein, and low-fat dairy products. Take vitamin and mineral supplements as recommended by your health care provider. Do not drink alcohol if: Your health care provider tells you not to drink. You are pregnant, may be pregnant, or are planning to become pregnant. If you drink alcohol: Limit how much you have to 0-1 drink a day. Be aware of how much alcohol is in your drink. In the [place] one drink equals one 12 oz bottle of beer (355 mL), one 5 oz glass of wine (148 mL), or one 1 oz glass of hard liquor (44 mL). Lifestyle Take daily care of your teeth and gums. Brush your teeth every morning and night with fluoride toothpaste. Floss one time each day. Stay active. Exercise for at least 30 minutes 5 or more days each week. Do not use any products that contain nicotine or tobacco, such as cigarettes, e-cigarettes, and chewing tobacco. If you need help quitting, ask your health care provider. Do not use drugs. If you are sexually active, practice safe sex. Use a condom or other form of protection to prevent STIs (sexually transmitted infections). If you do not wish to become pregnant, use a form of birth control. If you plan to become pregnant, see your health care provider for a prepregnancy visit. If told by your health care provider, take low-dose aspirin daily starting at age 33. Find healthy ways to cope with stress, such as: Meditation, yoga, or listening to music. Journaling. Talking to a trusted  person. Spending time with friends and family. Safety Always wear your seat belt while driving or riding in a vehicle. Do not drive: If you have been drinking alcohol. Do not ride with someone who has been drinking. When you are tired or distracted. While texting. Wear a helmet and other protective equipment during sports activities. If you have firearms in your house, make sure you follow all gun safety procedures. What's next? Visit your health care provider once a year for an annual wellness visit. Ask your health care provider how often you should have your eyes and teeth checked. Stay up to date on all vaccines. This information is not intended to replace advice given to you by your health care provider. Make sure you discuss any questions you have with your health care provider. Document Revised: 02/01/2021 Document Reviewed: 08/05/2018 Elsevier Patient Education  2022 [employer].

## 2021-08-27 NOTE — Assessment & Plan Note (Signed)
Is considering switching from Lexapro to Wellbutrin to help with weight loss but is not going to change today as the Lexapro is helping.

## 2021-08-27 NOTE — Assessment & Plan Note (Signed)
Avoid offending foods, start probiotics. Do not eat large meals in late evening and consider raising head of bed. Has been using Omeprazole 20 mg daily prn will prescribe

## 2021-08-27 NOTE — Progress Notes (Signed)
Patient ID: [name], female    DOB: [birth year]  Age: 50 y.o. MRN: [id number]    Subjective:   Chief Complaint  Patient presents with   Annual Exam    Pt would like to discuss her shoulder issues and vaginal odor.   Subjective  HPI [name] presents for office visit today for comprehensive physical exam today and follow up on management of chronic concerns. She reports that last week she got a strep throat infection and bv and had to go to urgent care to get it treated. At the moment, she is still experiencing residual odor, but denies itching or other GU symptoms. Denies CP/palp/SOB/HA/congestion/fevers or GI c/o. Taking meds as prescribed.  She has started taking omeprazole 20 mg which she states has helped greatly with her acid reflux symptoms. She is currently on Lexapro and was wondering if she could switch Wellbutrin for possible weight loss benefits.  She started experiencing left shoulder pain with limited ROM. Dr. Madelon Lips dx her with shoulder impingement  and administered her a cortisol shot last July that improved her pain for 3 days. Recently, her right shoulder has started to be painful as well so she went to Dr. Candise Bowens office yesterday to get it addressed.   Review of Systems  Constitutional:  Negative for chills, fatigue and fever.  HENT:  Negative for congestion, rhinorrhea, sinus pressure, sinus pain and sore throat.   Eyes:  Negative for pain.  Respiratory:  Negative for cough and shortness of breath.   Cardiovascular:  Negative for chest pain, palpitations and leg swelling.  Gastrointestinal:  Negative for abdominal pain, blood in stool, diarrhea, nausea and vomiting.  Genitourinary:  Negative for decreased urine volume, flank pain, frequency, vaginal bleeding and vaginal discharge.       (+) vaginal odor  Musculoskeletal:  Negative for back pain.  Neurological:  Negative for headaches.   History Past Medical History:   Diagnosis Date   Acute upper respiratory infections of unspecified site 08/21/2014   Allergy    seasonal allergies   Anxiety    on meds   Anxiety and depression 10/27/2011   on meds   Arthritis    lower back   GERD (gastroesophageal reflux disease)    OTC PRN-diet controlled   Granuloma annulare    bx confirmed, improved   Hx: UTI (urinary tract infection)    in childhood, resolved with urethral stretching   Hyperlipidemia 04/01/2017   diet controlled   Left flank pain 08/07/2013   Migraine 06/22/2017   Obesity 09/03/2015   Palpitations    PONV (postoperative nausea and vomiting)    Preventative health care 08/26/2011   Toe pain, right 04/07/2012   URI (upper respiratory infection) 01/09/2013    She has a past surgical history that includes Cesarean section; Cholecystectomy; Tubal ligation; Bunionectomy (Left); Tonsillectomy; Urethra dilation; and Ectopic pregnancy surgery (2001).   Her family history includes Alzheimer's disease in her maternal grandfather and maternal grandmother; COPD in her father; Colon polyps (age of onset: 63) in her father; Depression in her father; Heart disease in her paternal grandfather; Hypertension in her brother, father, and mother.She reports that she has never smoked. She has never used smokeless tobacco. She reports that she does not drink alcohol and does not use drugs.  Current Outpatient Medications on File Prior to Visit  Medication Sig Dispense Refill   escitalopram (LEXAPRO) 20 MG tablet TAKE 1 TABLET BY MOUTH EVERY DAY 90 tablet 1  fluticasone (FLONASE) 50 MCG/ACT nasal spray Place 2 sprays into both nostrils daily. 16 g 6   levothyroxine (SYNTHROID) 25 MCG tablet TAKE 1 TABLET BY MOUTH EVERY DAY BEFORE BREAKFAST 90 tablet 1   No current facility-administered medications on file prior to visit.     Objective:  Objective  Physical Exam Constitutional:      General: She is not in acute distress.    Appearance: Normal appearance. She is  not ill-appearing or toxic-appearing.  HENT:     Head: Normocephalic and atraumatic.     Right Ear: Tympanic membrane, ear canal and external ear normal.     Left Ear: Tympanic membrane, ear canal and external ear normal.     Nose: No congestion or rhinorrhea.  Eyes:     Extraocular Movements: Extraocular movements intact.     Right eye: No nystagmus.     Left eye: No nystagmus.     Pupils: Pupils are equal, round, and reactive to light.  Cardiovascular:     Rate and Rhythm: Normal rate and regular rhythm.     Pulses: Normal pulses.     Heart sounds: Normal heart sounds. No murmur heard. Pulmonary:     Effort: Pulmonary effort is normal. No respiratory distress.     Breath sounds: Normal breath sounds. No wheezing, rhonchi or rales.  Abdominal:     General: Bowel sounds are normal.     Palpations: Abdomen is soft. There is no mass.     Tenderness: There is no abdominal tenderness. There is no guarding.     Hernia: No hernia is present.  Musculoskeletal:        General: Normal range of motion.     Cervical back: Normal range of motion and neck supple.  Skin:    General: Skin is warm and dry.  Neurological:     Mental Status: She is alert and oriented to person, place, and time.     Cranial Nerves: No facial asymmetry.     Motor: Motor function is intact. No weakness.     Deep Tendon Reflexes:     Reflex Scores:      Patellar reflexes are 1+ on the right side and 2+ on the left side. Psychiatric:        Behavior: Behavior normal.   BP 112/74   Pulse 77   Temp 98.1 F (36.7 C)   Resp 16   Ht 5\' 7"  (1.702 m)   Wt 233 lb 9.6 oz (106 kg)   SpO2 95%   BMI 36.59 kg/m  Wt Readings from Last 3 Encounters:  08/28/21 236 lb (107 kg)  08/27/21 233 lb 9.6 oz (106 kg)  02/15/21 230 lb (104.3 kg)     Lab Results  Component Value Date   WBC 5.3 08/27/2021   HGB 12.7 08/27/2021   HCT 37.7 08/27/2021   PLT 313.0 08/27/2021   GLUCOSE 87 08/27/2021   CHOL 199 08/27/2021    TRIG 100.0 08/27/2021   HDL 51.40 08/27/2021   LDLDIRECT 125.0 06/20/2019   LDLCALC 127 (H) 08/27/2021   ALT 18 08/27/2021   AST 20 08/27/2021   NA 139 08/27/2021   K 4.4 08/27/2021   CL 102 08/27/2021   CREATININE 0.99 08/27/2021   BUN 15 08/27/2021   CO2 31 08/27/2021   TSH 2.84 08/27/2021    No results found.   Assessment & Plan:  Plan    Meds ordered this encounter  Medications   omeprazole (PRILOSEC) 20 MG  capsule    Sig: Take 1 capsule (20 mg total) by mouth daily as needed.    Dispense:  30 capsule    Refill:  1   ALPRAZolam (XANAX) 0.25 MG tablet    Sig: Take 1 tablet (0.25 mg total) by mouth 2 (two) times daily as needed.    Dispense:  40 tablet    Refill:  1    This request is for a new prescription for a controlled substance as required by Federal/State law.    Problem List Items Addressed This Visit     Preventative health care - Primary    Patient encouraged to maintain heart healthy diet, regular exercise, adequate sleep. Consider daily probiotics. Take medications as prescribed. Labs ordered and reviewed. Tdap given today.   This is a list of the screening recommended for you and due dates:  Health Maintenance  Topic Date Due   COVID-19 Vaccine (1) Never done   Hepatitis C Screening: USPSTF Recommendation to screen - Ages 51-79 yo.  Never done   Zoster (Shingles) Vaccine (1 of 2) Never done   Pap Smear  11/19/2020   Flu Shot  07/08/2021   Mammogram  06/07/2022   Colon Cancer Screening  02/16/2031   Tetanus Vaccine  08/28/2031   HIV Screening  Completed   HPV Vaccine  Aged Out       Relevant Orders   CBC with Differential/Platelet (Completed)   Comprehensive metabolic panel (Completed)   Lipid panel (Completed)   TSH (Completed)   Anxiety and depression    Is considering switching from Lexapro to Wellbutrin to help with weight loss but is not going to change today as the Lexapro is helping.       Relevant Medications   ALPRAZolam (XANAX)  0.25 MG tablet   Acid reflux    Avoid offending foods, start probiotics. Do not eat large meals in late evening and consider raising head of bed. Has been using Omeprazole 20 mg daily prn will prescribe      Relevant Medications   omeprazole (PRILOSEC) 20 MG capsule   Obesity    Encouraged DASH or MIND diet, decrease po intake and increase exercise as tolerated. Needs 7-8 hours of sleep nightly. Avoid trans fats, eat small, frequent meals every 4-5 hours with lean proteins, complex carbs and healthy fats. Minimize simple carbs, high fat foods and processed foods. Consider [phone] or Saxenda      Hyperlipidemia    Encourage heart healthy diet such as MIND or DASH diet, increase exercise, avoid trans fats, simple carbohydrates and processed foods, consider a krill or fish or flaxseed oil cap daily.       Relevant Orders   CBC with Differential/Platelet (Completed)   Comprehensive metabolic panel (Completed)   Lipid panel (Completed)   TSH (Completed)   BV (bacterial vaginosis)    Recurrent. Usually asymptomatic except for odor. She will try weekly to monthly dosing of Boric Acid and see if that is helpful       Bilateral shoulder pain    Left shoulder started first with left shoulder and is now in right, referred to sports medicine.      Relevant Orders   Ambulatory referral to Sports Medicine   Other Visit Diagnoses     High risk medication use       Relevant Orders   DRUG MONITORING, PANEL 8 WITH CONFIRMATION, URINE (Completed)   Hypothyroidism, unspecified type       Relevant Orders   TSH (  Completed)   Need for Tdap vaccination       Relevant Orders   Tdap vaccine greater than or equal to 7yo IM (Completed)       Follow-up: Return in about 6 months (around 02/24/2022) for f/u.  I, Billie Lade, acting as a scribe for Danise Edge, MD, have documented all relevent documentation on behalf of Danise Edge, MD, as directed by Danise Edge, MD while in the presence of Danise Edge, MD. DO:08/30/21.  I, Bradd Canary, MD personally performed the services described in this documentation. All medical record entries made by the scribe were at my direction and in my presence. I have reviewed the chart and agree that the record reflects my personal performance and is accurate and complete

## 2021-08-27 NOTE — Assessment & Plan Note (Signed)
Left shoulder started first with left shoulder and is now in right, referred to sports medicine.

## 2021-08-27 NOTE — Assessment & Plan Note (Signed)
Encourage heart healthy diet such as MIND or DASH diet, increase exercise, avoid trans fats, simple carbohydrates and processed foods, consider a krill or fish or flaxseed oil cap daily.  °

## 2021-08-28 ENCOUNTER — Ambulatory Visit (INDEPENDENT_AMBULATORY_CARE_PROVIDER_SITE_OTHER): Payer: BC Managed Care – PPO | Admitting: Sports Medicine

## 2021-08-28 ENCOUNTER — Ambulatory Visit: Payer: Self-pay

## 2021-08-28 ENCOUNTER — Other Ambulatory Visit: Payer: Self-pay

## 2021-08-28 VITALS — BP 124/80 | HR 89 | Ht 67.0 in | Wt 236.0 lb

## 2021-08-28 DIAGNOSIS — M25511 Pain in right shoulder: Secondary | ICD-10-CM

## 2021-08-28 DIAGNOSIS — M19012 Primary osteoarthritis, left shoulder: Secondary | ICD-10-CM

## 2021-08-28 DIAGNOSIS — M25512 Pain in left shoulder: Secondary | ICD-10-CM

## 2021-08-28 NOTE — Patient Instructions (Addendum)
Good to see you  Injection given in L shoulder  More shoulder exercises given  If you decide you want to see physical therapy let us know  See me again in 4 weeks

## 2021-08-28 NOTE — Progress Notes (Signed)
[name] D.[name] [address] [place] [address] Phone: [phone]   Assessment and Plan:     1. Acute pain of both shoulders 2. Osteoarthritis of left acromioclavicular joint - Acute on chronic, initial sports medicine visit - Likely an acute flare of AC joint arthritis from patient sleeping on her left side based on HPI, physical exam, failure to improve with subacromial CSI - Patient elected for [facility] joint CSI.  Tolerated well per procedure note below - Continue HEP - NSAIDs/Tylenol as needed for pain control - [place] LIMITED JOINT SPACE STRUCTURES UP LEFT(NO LINKED CHARGES); Future    Sports Medicine: Musculoskeletal Ultrasound. Procedure: Ultrasound Guided Acromioclavicular Joint Injection/Aspiration Side: left Diagnosis: Left shoulder pain, flare of AC joint arthritis [place] Indication:  - accuracy is paramount for diagnosis - to ensure therapeutic efficacy or procedural success - to reduce procedural risk  After PARQ discussed and consent was given verbally. The site was cleaned with chlorhexidine prep. An ultrasound transducer was placed on the shoulder.  The acromioclavicular joint was identified.  The capsule is intact.   A steroid injection was performed under ultrasound guidance with sterile technique using  0.5 ml of 1% lidocaine without epinephrine and 20 mg of triamcinolone (KENALOG) 40mg /ml. This was well tolerated and resulted in improved relief.  Needle was removed and dressing placed and post injection instructions were none pertinent given including  a discussion of likely return of pain today after the anesthetic wears off (with the possibility of worsened pain) until the steroid starts to work in 1-3 days.   Pt was advised to call or return to clinic if these symptoms worsen or fail to improve as anticipated.  Pertinent previous records reviewed include none   Follow Up: In 4 weeks   Subjective:   I, ,  am serving as a scribe for Dr. Debbe Odea  Chief Complaint: shoulder pain   HPI: 50 year old female presenting with bilateral shoulder pain   08/28/21 Patient states shoulders started hurting in beginning of July. The pain started in left shoulder and now is experiencing pain in right shoulder that started 3 weeks ago. Patient was seen at [facility] and was diagnosed with L shoulder impingement. Received a steroid injection in L shoulder. Patient describes pain as a dull ache. Patient locates pain to above bicep. MOI: no   Radiates: no Mechanical symptoms:no Numbness/tingling: no Weakness: no Aggravates: reaching behind her, any type of reaching, sleeping on sides  Treatments tried: at home exercises, ibuprofen, ice in the beginning, Nabumetone, Cortisone injection    Relevant Historical Information: None pertinent  Additional pertinent review of systems negative.   Current Outpatient Medications:    escitalopram (LEXAPRO) 20 MG tablet, TAKE 1 TABLET BY MOUTH EVERY DAY, Disp: 90 tablet, Rfl: 1   fluticasone (FLONASE) 50 MCG/ACT nasal spray, Place 2 sprays into both nostrils daily., Disp: 16 g, Rfl: 6   levothyroxine (SYNTHROID) 25 MCG tablet, TAKE 1 TABLET BY MOUTH EVERY DAY BEFORE BREAKFAST, Disp: 90 tablet, Rfl: 1   omeprazole (PRILOSEC) 20 MG capsule, Take 1 capsule (20 mg total) by mouth daily as needed., Disp: 30 capsule, Rfl: 1   ALPRAZolam (XANAX) 0.25 MG tablet, Take 1 tablet (0.25 mg total) by mouth 2 (two) times daily as needed. (Patient not taking: Reported on 08/28/2021), Disp: 40 tablet, Rfl: 1   Objective:     Vitals:   08/28/21 1532  BP: 124/80  Pulse: 89  SpO2:  97%  Weight: 236 lb (107 kg)  Height: 5\' 7"  (1.702 m)      Body mass index is 36.96 kg/m.    Physical Exam:    Gen: Appears well, nad, nontoxic and pleasant Neuro:sensation intact, strength is 5/5 with df/pf/inv/ev, muscle tone wnl Skin: no suspicious lesion or defmority Psych: A&O,  appropriate mood and affect  Left shoulder: no deformity, swelling or muscle wasting No scapular winging FF 160, abd 120, int 10, ext 70 TTP over AC joint NTTP over the [place], clavicle, coracoid, biceps groove, humerus, deltoid, subacromial space, scap spine, musculature, trap, cervical spine Neg empty can, subscap liftoff, speeds, obriens Positive crossarm, Neer, [place] Neg ant drawer, sulcus sign Neg apprehension Negative Spurling's test bilat FROM of neck    Electronically signed by:  D.[name] 4:28 PM 08/28/21

## 2021-08-29 LAB — DRUG MONITORING, PANEL 8 WITH CONFIRMATION, URINE
6 Acetylmorphine: NEGATIVE ng/mL (ref <10)
Alcohol Metabolites: NEGATIVE ng/mL (ref <500)
Amphetamines: NEGATIVE ng/mL (ref <500)
Benzodiazepines: NEGATIVE ng/mL (ref <100)
Buprenorphine, Urine: NEGATIVE ng/mL (ref <5)
Cocaine Metabolite: NEGATIVE ng/mL (ref <150)
Creatinine: 166.5 mg/dL (ref >=20.0)
MDMA: NEGATIVE ng/mL (ref <500)
Marijuana Metabolite: NEGATIVE ng/mL (ref <20)
Opiates: NEGATIVE ng/mL (ref <100)
Oxidant: NEGATIVE ug/mL (ref <200)
Oxycodone: NEGATIVE ng/mL (ref <100)
pH: 6.1 (ref 4.5–9.0)

## 2021-08-29 LAB — DM TEMPLATE

## 2021-08-30 NOTE — Assessment & Plan Note (Addendum)
Patient encouraged to maintain heart healthy diet, regular exercise, adequate sleep. Consider daily probiotics. Take medications as prescribed. Labs ordered and reviewed. Tdap given today.   This is a list of the screening recommended for you and due dates:  Health Maintenance  Topic Date Due  . COVID-19 Vaccine (1) Never done  . Hepatitis C Screening: USPSTF Recommendation to screen - Ages 51-51 yo.  Never done  . Zoster (Shingles) Vaccine (1 of 2) Never done  . Pap Smear  11/19/2020  . Flu Shot  07/08/2021  . Mammogram  06/07/2022  . Colon Cancer Screening  02/16/2031  . Tetanus Vaccine  08/28/2031  . HIV Screening  Completed  . HPV Vaccine  Aged Out

## 2021-08-30 NOTE — Assessment & Plan Note (Signed)
Recurrent. Usually asymptomatic except for odor. She will try weekly to monthly dosing of Boric Acid and see if that is helpful

## 2021-08-30 NOTE — Assessment & Plan Note (Signed)
Encouraged DASH or MIND diet, decrease po intake and increase exercise as tolerated. Needs 7-8 hours of sleep nightly. Avoid trans fats, eat small, frequent meals every 4-5 hours with lean proteins, complex carbs and healthy fats. Minimize simple carbs, high fat foods and processed foods. Consider Wegovy or Saxenda °

## 2021-09-07 ENCOUNTER — Other Ambulatory Visit: Payer: BC Managed Care – PPO

## 2021-09-19 ENCOUNTER — Other Ambulatory Visit: Payer: Self-pay | Admitting: Family Medicine

## 2021-09-21 DIAGNOSIS — M2559 Pain in other specified joint: Secondary | ICD-10-CM | POA: Diagnosis not present

## 2021-09-21 DIAGNOSIS — E039 Hypothyroidism, unspecified: Secondary | ICD-10-CM | POA: Diagnosis not present

## 2021-09-21 DIAGNOSIS — Z724 Inappropriate diet and eating habits: Secondary | ICD-10-CM | POA: Diagnosis not present

## 2021-09-21 DIAGNOSIS — E669 Obesity, unspecified: Secondary | ICD-10-CM | POA: Diagnosis not present

## 2021-09-21 DIAGNOSIS — Z713 Dietary counseling and surveillance: Secondary | ICD-10-CM | POA: Diagnosis not present

## 2021-09-25 ENCOUNTER — Other Ambulatory Visit: Payer: Self-pay

## 2021-09-25 ENCOUNTER — Ambulatory Visit (INDEPENDENT_AMBULATORY_CARE_PROVIDER_SITE_OTHER): Payer: BC Managed Care – PPO | Admitting: Sports Medicine

## 2021-09-25 VITALS — BP 130/80 | HR 72 | Ht 67.0 in | Wt 233.0 lb

## 2021-09-25 DIAGNOSIS — M25511 Pain in right shoulder: Secondary | ICD-10-CM

## 2021-09-25 DIAGNOSIS — M25512 Pain in left shoulder: Secondary | ICD-10-CM

## 2021-09-25 DIAGNOSIS — M19012 Primary osteoarthritis, left shoulder: Secondary | ICD-10-CM

## 2021-09-25 NOTE — Patient Instructions (Addendum)
Good to see you  Continue exercises Use tylenol arthritis 1-2 tablets a day to help with pain Can call us if you decide you want to do physical therapy  See me again as needed

## 2021-09-25 NOTE — Progress Notes (Addendum)
Aleen Sells D.[name] [address] [place] [address] Phone: [phone]   Assessment and Plan:     1. Acute pain of both shoulders 2. Osteoarthritis of left acromioclavicular joint -Chronic with exacerbation, subsequent visit -Left AC joint pain has significantly improved after AC CSI at last visit.  Generalized bilateral shoulder pain has improved after HEP - Patient continues to have mild daily bilateral shoulder aching, however is not limiting her day-to-day activities, so we elected to continue with conservative therapy - Continue Tylenol arthritis 1-2 times daily as needed for pain control - Continue HEP for shoulders bilaterally - Offered formal PT for bilateral shoulders.  Patient declined at this time, but stated she would call if she decides she wants a referral in the future   Pertinent previous records reviewed include previous office note   Follow Up: As needed if no improvement or worsening of symptoms.  Could consider ultrasound versus repeat CSI versus formal PT   Subjective:   I, Debbe Odea, am serving as a scribe for Dr. Richardean Sale  Chief Complaint: bilateral shoulder pain follow up   HPI:   08/28/21 Patient states shoulders started hurting in beginning of July. The pain started in left shoulder and now is experiencing pain in right shoulder that started 3 weeks ago. Patient was seen at [facility] and was diagnosed with L shoulder impingement. Received a steroid injection in L shoulder. Patient describes pain as a dull ache. Patient locates pain to above bicep. MOI: no    Radiates: no Mechanical symptoms:no Numbness/tingling: no Weakness: no Aggravates: reaching behind her, any type of reaching, sleeping on sides  Treatments tried: at home exercises, ibuprofen, ice in the beginning, Nabumetone, Cortisone injection   01/20/22 Today patient states that the shoulders are much better has her full ROM  but she still has slight pain and soreness.   Relevant Historical Information: None pertinent  Additional pertinent review of systems negative.   Current Outpatient Medications:    escitalopram (LEXAPRO) 20 MG tablet, TAKE 1 TABLET BY MOUTH EVERY DAY, Disp: 90 tablet, Rfl: 1   fluticasone (FLONASE) 50 MCG/ACT nasal spray, Place 2 sprays into both nostrils daily., Disp: 16 g, Rfl: 6   levothyroxine (SYNTHROID) 25 MCG tablet, TAKE 1 TABLET BY MOUTH EVERY DAY BEFORE BREAKFAST, Disp: 90 tablet, Rfl: 1   omeprazole (PRILOSEC) 20 MG capsule, TAKE 1 CAPSULE BY MOUTH DAILY AS NEEDED., Disp: 90 capsule, Rfl: 1   ALPRAZolam (XANAX) 0.25 MG tablet, Take 1 tablet (0.25 mg total) by mouth 2 (two) times daily as needed. (Patient not taking: No sig reported), Disp: 40 tablet, Rfl: 1   Objective:     Vitals:   09/25/21 1530  BP: 130/80  Pulse: 72  SpO2: 97%  Weight: 233 lb (105.7 kg)  Height: 5\' 7"  (1.702 m)      Body mass index is 36.49 kg/m.    Physical Exam:    Gen: Appears well, nad, nontoxic and pleasant Neuro:sensation intact, strength is 5/5 with df/pf/inv/ev, muscle tone wnl Skin: no suspicious lesion or defmority Psych: A&O, appropriate mood and affect  Left shoulder: no deformity, swelling or muscle wasting No scapular winging FF 180, abd 180, int 0, ext 90 Mild TTP to anterior shoulder musculature and AC joint NTTP over the [place], clavicle, coracoid, biceps groove, humerus, deltoid, trapezius, cervical spine Neg neer, hawk[place], empty can, subscap liftoff, speeds, obriens, Positive crossarm Neg ant drawer, sulcus sign, apprehension Negative Spurling's test  bilat FROM of neck    Electronically signed by:  Aleen Sells D.[name] 4:11 PM 01/20/22

## 2021-09-28 DIAGNOSIS — E669 Obesity, unspecified: Secondary | ICD-10-CM | POA: Diagnosis not present

## 2021-09-28 DIAGNOSIS — E039 Hypothyroidism, unspecified: Secondary | ICD-10-CM | POA: Diagnosis not present

## 2021-09-28 DIAGNOSIS — M2559 Pain in other specified joint: Secondary | ICD-10-CM | POA: Diagnosis not present

## 2021-09-28 DIAGNOSIS — Z713 Dietary counseling and surveillance: Secondary | ICD-10-CM | POA: Diagnosis not present

## 2021-10-05 DIAGNOSIS — E039 Hypothyroidism, unspecified: Secondary | ICD-10-CM | POA: Diagnosis not present

## 2021-10-05 DIAGNOSIS — M2559 Pain in other specified joint: Secondary | ICD-10-CM | POA: Diagnosis not present

## 2021-10-05 DIAGNOSIS — Z723 Lack of physical exercise: Secondary | ICD-10-CM | POA: Diagnosis not present

## 2021-10-05 DIAGNOSIS — E669 Obesity, unspecified: Secondary | ICD-10-CM | POA: Diagnosis not present

## 2021-10-08 DIAGNOSIS — Z1231 Encounter for screening mammogram for malignant neoplasm of breast: Secondary | ICD-10-CM | POA: Diagnosis not present

## 2021-10-12 DIAGNOSIS — E039 Hypothyroidism, unspecified: Secondary | ICD-10-CM | POA: Diagnosis not present

## 2021-10-12 DIAGNOSIS — M2559 Pain in other specified joint: Secondary | ICD-10-CM | POA: Diagnosis not present

## 2021-10-12 DIAGNOSIS — E669 Obesity, unspecified: Secondary | ICD-10-CM | POA: Diagnosis not present

## 2021-10-23 DIAGNOSIS — H40011 Open angle with borderline findings, low risk, right eye: Secondary | ICD-10-CM | POA: Diagnosis not present

## 2021-10-26 DIAGNOSIS — E669 Obesity, unspecified: Secondary | ICD-10-CM | POA: Diagnosis not present

## 2021-10-26 DIAGNOSIS — Z713 Dietary counseling and surveillance: Secondary | ICD-10-CM | POA: Diagnosis not present

## 2021-10-26 DIAGNOSIS — E639 Nutritional deficiency, unspecified: Secondary | ICD-10-CM | POA: Diagnosis not present

## 2021-10-26 DIAGNOSIS — E785 Hyperlipidemia, unspecified: Secondary | ICD-10-CM | POA: Diagnosis not present

## 2021-10-26 DIAGNOSIS — E039 Hypothyroidism, unspecified: Secondary | ICD-10-CM | POA: Diagnosis not present

## 2021-10-26 DIAGNOSIS — Z3202 Encounter for pregnancy test, result negative: Secondary | ICD-10-CM | POA: Diagnosis not present

## 2021-11-16 DIAGNOSIS — E669 Obesity, unspecified: Secondary | ICD-10-CM | POA: Diagnosis not present

## 2021-11-16 DIAGNOSIS — E639 Nutritional deficiency, unspecified: Secondary | ICD-10-CM | POA: Diagnosis not present

## 2021-11-16 DIAGNOSIS — Z713 Dietary counseling and surveillance: Secondary | ICD-10-CM | POA: Diagnosis not present

## 2021-11-16 DIAGNOSIS — E039 Hypothyroidism, unspecified: Secondary | ICD-10-CM | POA: Diagnosis not present

## 2021-11-16 DIAGNOSIS — M2559 Pain in other specified joint: Secondary | ICD-10-CM | POA: Diagnosis not present

## 2022-01-02 DIAGNOSIS — E039 Hypothyroidism, unspecified: Secondary | ICD-10-CM | POA: Diagnosis not present

## 2022-01-02 DIAGNOSIS — Z713 Dietary counseling and surveillance: Secondary | ICD-10-CM | POA: Diagnosis not present

## 2022-01-02 DIAGNOSIS — E669 Obesity, unspecified: Secondary | ICD-10-CM | POA: Diagnosis not present

## 2022-01-02 DIAGNOSIS — E639 Nutritional deficiency, unspecified: Secondary | ICD-10-CM | POA: Diagnosis not present

## 2022-01-02 DIAGNOSIS — M2559 Pain in other specified joint: Secondary | ICD-10-CM | POA: Diagnosis not present

## 2022-01-13 NOTE — Progress Notes (Signed)
[name] D.[name] [address] [place] [address] Phone: [phone]   Assessment and Plan:     1. Chronic pain of both shoulders -Chronic with exacerbation, subsequent visit - Recurrence of bilateral shoulder pain.  Likely subacromial bursitis versus chronic tendinopathy's - X-ray obtained in clinic.  My interpretation: No acute fracture or dislocation. Cortical changes at bilateral [facility] joint with mild decrease space - Patient's pain has generally improved over the past 1 week with relative rest and Tylenol arthritis.  We will continue with conservative therapy at this time - Start HEP -- Start meloxicam 15 mg daily x2 weeks.  If still having pain after 2 weeks, complete 3rd-week of meloxicam. May use remaining meloxicam as needed once daily for pain control.  Do not to use additional NSAIDs while taking meloxicam.  May use Tylenol [phone] mg to 3 times a day for breakthrough pain.  - DG Shoulder Left; Future - DG Shoulder Right; Future    Pertinent previous records reviewed include none   Follow Up: 3 to 4 weeks for reevaluation.  If no improvement or worsening of symptoms could consider physical therapy versus subacromial CSI   Subjective:   I, [name], am serving as a Neurosurgeon for Doctor Richardean Sale  Chief Complaint: shoulder pain   HPI:    08/28/21 Patient states shoulders started hurting in beginning of July. The pain started in left shoulder and now is experiencing pain in right shoulder that started 3 weeks ago. Patient was seen at [facility] and was diagnosed with L shoulder impingement. Received a steroid injection in L shoulder. Patient describes pain as a dull ache. Patient locates pain to above bicep. MOI: no    Radiates: no Mechanical symptoms:no Numbness/tingling: no Weakness: no Aggravates: reaching behind her, any type of reaching, sleeping on sides  Treatments tried: at home exercises, ibuprofen,  ice in the beginning, Nabumetone, Cortisone injection    09/25/21 Today patient states that the shoulders are much better has her full ROM but she still has slight pain and soreness.    01/20/2022 Patient states that she still has issues with both of them would like to know if she can get a new injection , wants to talk about PT, cant sleep on sides do to pain , today not bothering her as bad as when she made the appointment but its still there hasn't gone away    Relevant Historical Information: None pertinent  Additional pertinent review of systems negative.   Current Outpatient Medications:    ALPRAZolam (XANAX) 0.25 MG tablet, Take 1 tablet (0.25 mg total) by mouth 2 (two) times daily as needed. (Patient not taking: No sig reported), Disp: 40 tablet, Rfl: 1   escitalopram (LEXAPRO) 20 MG tablet, TAKE 1 TABLET BY MOUTH EVERY DAY, Disp: 90 tablet, Rfl: 1   fluticasone (FLONASE) 50 MCG/ACT nasal spray, Place 2 sprays into both nostrils daily., Disp: 16 g, Rfl: 6   levothyroxine (SYNTHROID) 25 MCG tablet, TAKE 1 TABLET BY MOUTH EVERY DAY BEFORE BREAKFAST, Disp: 90 tablet, Rfl: 1   omeprazole (PRILOSEC) 20 MG capsule, TAKE 1 CAPSULE BY MOUTH DAILY AS NEEDED., Disp: 90 capsule, Rfl: 1   Objective:     Vitals:   01/20/22 1549  BP: 120/70  Pulse: 88  SpO2: 97%  Weight: 234 lb (106.1 kg)  Height: 5\' 7"  (1.702 m)      Body mass index is 36.65 kg/m.    Physical Exam:  Gen: Appears well, nad, nontoxic and pleasant Neuro:sensation intact, strength is 5/5 with df/pf/inv/ev, muscle tone wnl Skin: no suspicious lesion or defmority Psych: A&O, appropriate mood and affect  Bilateral shoulder: no deformity, swelling or muscle wasting No scapular winging FF 180, abd 180, int 0, ext 90 with pain in end ranges of motion TTP anterior and lateral shoulder musculature, AC joints NTTP over the Duck, clavicle, , coracoid, biceps groove, humerus, trapezius, cervical spine Neg neer, hawkings,  empty can, subscap liftoff, speeds, obriens, crossarm Neg ant drawer, sulcus sign, apprehension Negative Spurling's test bilat FROM of neck    Electronically signed by:  [name] D.[name] 4:19 PM 01/20/22

## 2022-01-20 ENCOUNTER — Ambulatory Visit (INDEPENDENT_AMBULATORY_CARE_PROVIDER_SITE_OTHER): Payer: BC Managed Care – PPO

## 2022-01-20 ENCOUNTER — Ambulatory Visit (INDEPENDENT_AMBULATORY_CARE_PROVIDER_SITE_OTHER): Payer: BC Managed Care – PPO | Admitting: Sports Medicine

## 2022-01-20 VITALS — BP 120/70 | HR 88 | Ht 67.0 in | Wt 234.0 lb

## 2022-01-20 DIAGNOSIS — M25511 Pain in right shoulder: Secondary | ICD-10-CM

## 2022-01-20 DIAGNOSIS — G8929 Other chronic pain: Secondary | ICD-10-CM

## 2022-01-20 DIAGNOSIS — M25512 Pain in left shoulder: Secondary | ICD-10-CM

## 2022-01-20 MED ORDER — MELOXICAM 15 MG PO TABS: 15.0000 mg | ORAL_TABLET | Freq: Every day | ORAL | 0 refills | Status: DC

## 2022-01-20 NOTE — Patient Instructions (Addendum)
Good to see you  Start meloxicam 15 mg daily x2 weeks.  If still having pain after 2 weeks, complete 3rd-week of meloxicam. May use remaining meloxicam as needed once daily for pain control.  Do not to use additional NSAIDs while taking meloxicam.  May use Tylenol [phone] mg to 3 times a day for breakthrough pain. Continue HEP  4 week follow

## 2022-01-26 ENCOUNTER — Other Ambulatory Visit: Payer: Self-pay | Admitting: Family Medicine

## 2022-02-17 ENCOUNTER — Ambulatory Visit: Payer: BC Managed Care – PPO | Admitting: Sports Medicine

## 2022-02-22 ENCOUNTER — Other Ambulatory Visit: Payer: Self-pay | Admitting: Sports Medicine

## 2022-03-06 ENCOUNTER — Other Ambulatory Visit: Payer: Self-pay | Admitting: Family Medicine

## 2022-04-03 DIAGNOSIS — H02834 Dermatochalasis of left upper eyelid: Secondary | ICD-10-CM | POA: Diagnosis not present

## 2022-04-03 DIAGNOSIS — H02831 Dermatochalasis of right upper eyelid: Secondary | ICD-10-CM | POA: Diagnosis not present

## 2022-04-07 ENCOUNTER — Encounter: Payer: Self-pay | Admitting: Gastroenterology

## 2022-04-07 ENCOUNTER — Ambulatory Visit (INDEPENDENT_AMBULATORY_CARE_PROVIDER_SITE_OTHER): Payer: BC Managed Care – PPO | Admitting: Gastroenterology

## 2022-04-07 VITALS — BP 102/78 | HR 90 | Wt 226.2 lb

## 2022-04-07 DIAGNOSIS — R1013 Epigastric pain: Secondary | ICD-10-CM | POA: Diagnosis not present

## 2022-04-07 DIAGNOSIS — K219 Gastro-esophageal reflux disease without esophagitis: Secondary | ICD-10-CM

## 2022-04-07 DIAGNOSIS — E669 Obesity, unspecified: Secondary | ICD-10-CM | POA: Diagnosis not present

## 2022-04-07 DIAGNOSIS — Z6835 Body mass index (BMI) 35.0-35.9, adult: Secondary | ICD-10-CM

## 2022-04-07 DIAGNOSIS — R110 Nausea: Secondary | ICD-10-CM

## 2022-04-07 NOTE — Patient Instructions (Addendum)
If you are age 52 or older, your body mass index should be between 23-30. Your Body mass index is 35.44 kg/m?Marland Kitchen If this is out of the aforementioned range listed, please consider follow up with your Primary Care Provider. ? ?If you are age 39 or younger, your body mass index should be between 19-25. Your Body mass index is 35.44 kg/m?Marland Kitchen If this is out of the aformentioned range listed, please consider follow up with your Primary Care Provider.  ? ?________________________________________________________ ? ?The [facility] providers would like to encourage you to use [facility] to communicate with providers for non-urgent requests or questions.  Due to long hold times on the telephone, sending your provider a message by [facility] may be a faster and more efficient way to get a response.  Please allow 48 business hours for a response.  Please remember that this is for non-urgent requests.  ?_______________________________________________________ ? ?You have been scheduled for an endoscopy. Please follow written instructions given to you at your visit today. ?If you use inhalers (even only as needed), please bring them with you on the day of your procedure. ? ?Please call with any questions or concerns. ? ?It was a pleasure to see you today! ? ?[name], D.O. ? ? ? ? ? ?We want to thank you for trusting [facility] with your care. All of our staff and providers value the relationships we have built with our patients, and it is an honor to care for you.  ? ?We are writing to let you know that [facility] will close on Apr 21, 2022, and we invite you to continue to see Dr. Carmell Cannon and [name] at the [facility] [place] office location. We are consolidating our serices at these [facility] practices to better provide care. Our office staff will work with you to ensure a seamless transition.  ? ?Brandi Heck, DO -Dr. Bryan Lemma will be movig to [facility] at [address], [place], [place] [address], effective Apr 21, 2022.  Contact [phone] to schedule an appointment with him.  ? ?Brandi Austria, MD- Dr. Lyndel Safe will be movig to [facility] at [address] [place], [place] [address], effective Apr 21, 2022.  Contact [phone] to schedule an appointment with him.  ? ?Requesting Medical Records ?If you need to request your medical records, please follow the instructions below. Your medical records are confidential, and a copy can be transferred to another provider or released to you or another person you designate only with your permission. ? ?There are several ways to request your medical records: ?Requests for medical records can be submitted through our practice.   ?You can also request your records electronically, in your MyChart account by selecting the ?Request Health Records? tab.  ?If you need additional information on how to request records, please go to [email or web address]/, choose Patient Information, then select Request Medical Records. ?To make an appointment or if you have any questions about your health care needs, please contact our office at [phone] and one of our staff members will be glad to assist you. ?[facility] is committed to providing exceptional care for you and our community. Thank you for allowing [place] to serve your health care needs. ?Sincerely, ? ?[name], Director [facility] ?[facility] also offers convenient virtual care options. Sore throat? Sinus problems? Cold or flu symptoms? Get care from the comfort of home with [facility] Video Visits and e-Visits. Learn more about the  non-emergency conditions treated and start your virtual visit at [email or web address] ? ?

## 2022-04-07 NOTE — Progress Notes (Signed)
? ?Chief Complaint:    Nausea, abdominal pain ? ? ?HPI:   ? ? ?Patient is a 51 y.o. female with a history of anxiety, hypothyroidism, hyperlipidemia (diet-controlled), depression, GERD, presenting to the Gastroenterology Clinic for evaluation of nausea and epigastric abdominal pain. ? ?Nausea has been episodic, lasting a few days at a time. Has been present on/off for years. Has trialed ginger, OTC anti-emetics, w/o much relief.  ? ?Separately with MEG pain, which occurs at random. Not a/w nausea. Not a/w PO intake. No change in bowel habits, melena, hematochezia, wt loss.  ? ?Additionally, history of GERD which is relatively well controlled with Prilosec 20 mg which she takes every other day or Tums prn.  Reflux for 4 years or so. Index sxs of HB and regurgitation.  No dysphagia. Worse with fatty foods. GERD coincides with 40# wt gain in last 4 years.  ? ? ?Endoscopic History: ?- EGD (12/1989): 1-2 cm hiatal hernia mild duodenitis ?- Colonoscopy (08/2008): Normal ?- Colonoscopy (02/2021): Normal.  Repeat in 10 years ? ? ? ?  Latest Ref Rng & Units 08/27/2021  ? 10:09 AM 07/03/2020  ? 11:30 AM 06/20/2019  ? 11:05 AM  ?CBC  ?WBC 4.0 - 10.5 K/uL 5.3   6.3   5.2    ?Hemoglobin 12.0 - 15.0 g/dL 08.6   76.1   95.0    ?Hematocrit 36.0 - 46.0 % 37.7   37.8   37.5    ?Platelets 150.0 - 400.0 K/uL 313.0   244.0   281.0    ? ? ? ?  Latest Ref Rng & Units 08/27/2021  ? 10:09 AM 02/18/2021  ?  3:20 PM 07/03/2020  ? 11:30 AM  ?CMP  ?Glucose 70 - 99 mg/dL 87   73   84    ?BUN 6 - 23 mg/dL 15   14   18     ?Creatinine 0.40 - 1.20 mg/dL   9.32   6.71    ?Sodium 135 - 145 mEq/L 139   137   136    ?Potassium 3.5 - 5.1 mEq/L 4.4   4.0   4.7    ?Chloride 96 - 112 mEq/L 102   101   102    ?CO2 19 - 32 mEq/L 31   30   28     ?Calcium 8.4 - 10.5 mg/dL 9.3   9.1   8.8    ?Total Protein 6.0 - 8.3 g/dL 7.4   6.8   6.8    ?Total Bilirubin 0.2 - 1.2 mg/dL 0.6   0.5   0.4    ?Alkaline Phos 39 - 117 U/L 76   63   69    ?AST 0 - 37 U/L 20   27   26      ?ALT 0 - 35 U/L 18   27   28     ? ? ? ?Review of systems:     No chest pain, no SOB, no fevers, no urinary sx  ? ?Past Medical History:  ?Diagnosis Date  ? Acute upper respiratory infections of unspecified site 08/21/2014  ? Allergy   ? seasonal allergies  ? Anxiety   ? on meds  ? Anxiety and depression 10/27/2011  ? on meds  ? Arthritis   ? lower back  ? GERD (gastroesophageal reflux disease)   ? OTC PRN-diet controlled  ? Granuloma annulare   ? bx confirmed, improved  ? Hx: UTI (urinary tract infection)   ? in childhood,  resolved with urethral stretching  ? Hyperlipidemia 04/01/2017  ? diet controlled  ? Left flank pain 08/07/2013  ? Migraine 06/22/2017  ? Obesity 09/03/2015  ? Palpitations   ? PONV (postoperative nausea and vomiting)   ? Preventative health care 08/26/2011  ? Toe pain, right 04/07/2012  ? URI (upper respiratory infection) 01/09/2013  ? ? ?Patient's surgical history, family medical history, social history, medications and allergies were all reviewed in Epic  ? ? ?Current Outpatient Medications  ?Medication Sig Dispense Refill  ? ALPRAZolam (XANAX) 0.25 MG tablet Take 1 tablet (0.25 mg total) by mouth 2 (two) times daily as needed. 40 tablet 1  ? escitalopram (LEXAPRO) 20 MG tablet TAKE 1 TABLET BY MOUTH EVERY DAY 90 tablet 1  ? fluticasone (FLONASE) 50 MCG/ACT nasal spray Place 2 sprays into both nostrils daily. 16 g 6  ? levothyroxine (SYNTHROID) 25 MCG tablet TAKE 1 TABLET BY MOUTH EVERY DAY BEFORE BREAKFAST 90 tablet 1  ? meloxicam (MOBIC) 15 MG tablet Take 1 tablet (15 mg total) by mouth daily. 30 tablet 0  ? omeprazole (PRILOSEC) 20 MG capsule TAKE 1 CAPSULE BY MOUTH DAILY AS NEEDED. 90 capsule 1  ? ?No current facility-administered medications for this visit.  ? ? ?Physical Exam:   ? ? ?BP 102/78   Pulse 90   Wt 226 lb 4 oz (102.6 kg)   SpO2 97%   BMI 35.44 kg/m?  ? ?GENERAL:  Pleasant female in NAD ?PSYCH: : Cooperative, normal affect ?EENT:  conjunctiva pink, mucous membranes moist, neck supple  without masses ?CARDIAC:  RRR, no murmur heard, no peripheral edema ?PULM: Normal respiratory effort, lungs CTA bilaterally, no wheezing ?ABDOMEN:  Nondistended, soft, nontender. No obvious masses, no hepatomegaly,  normal bowel sounds ?SKIN:  turgor, no lesions seen ?Musculoskeletal:  Normal muscle tone, normal strength ?NEURO: Alert and oriented x 3, no focal neurologic deficits ? ? ?IMPRESSION and PLAN:   ? ?1) Nausea without emesis ?2) Epigastric pain ?- EGD to evaluate for mucosal/luminal pathology ?- If EGD unrevealing and ongoing symptoms, plan for CT abdomen ? ?3) GERD ?- Evaluate for erosive esophagitis, LES laxity, hiatal hernia time EGD as above ?- Continue Prilosec as currently prescribed.  May need to escalate therapy depending on endoscopic findings ?- Discussed relationship between weight gain and worsening reflux symptoms ? ?4) Obesity (BMI 35.4) ?- As above, discussed relationship between weight gain and worsening reflux symptoms ? ?The indications, risks, and benefits of EGD were explained to the patient in detail. Risks include but are not limited to bleeding, perforation, adverse reaction to medications, and cardiopulmonary compromise. Sequelae include but are not limited to the possibility of surgery, hospitalization, and mortality. The patient verbalized understanding and wished to proceed. All questions answered, referred to scheduler. Further recommendations pending results of the exam.  ? ? ? ?Shellia Cleverly ,DO, FACG 04/07/2022, 3:12 PM ? ?

## 2022-05-01 ENCOUNTER — Encounter: Payer: Self-pay | Admitting: Family Medicine

## 2022-05-01 DIAGNOSIS — L820 Inflamed seborrheic keratosis: Secondary | ICD-10-CM | POA: Diagnosis not present

## 2022-05-02 ENCOUNTER — Encounter: Payer: BC Managed Care – PPO | Admitting: Gastroenterology

## 2022-05-02 ENCOUNTER — Telehealth: Payer: BC Managed Care – PPO

## 2022-05-03 DIAGNOSIS — J0100 Acute maxillary sinusitis, unspecified: Secondary | ICD-10-CM | POA: Diagnosis not present

## 2022-05-06 ENCOUNTER — Encounter: Payer: BC Managed Care – PPO | Admitting: Gastroenterology

## 2022-05-13 ENCOUNTER — Other Ambulatory Visit: Payer: Self-pay | Admitting: Family Medicine

## 2022-05-22 ENCOUNTER — Encounter: Payer: Self-pay | Admitting: Family Medicine

## 2022-05-22 DIAGNOSIS — Z6836 Body mass index (BMI) 36.0-36.9, adult: Secondary | ICD-10-CM | POA: Diagnosis not present

## 2022-05-22 DIAGNOSIS — Z01419 Encounter for gynecological examination (general) (routine) without abnormal findings: Secondary | ICD-10-CM | POA: Diagnosis not present

## 2022-05-23 MED ORDER — MELOXICAM 15 MG PO TABS: 15.0000 mg | ORAL_TABLET | Freq: Every day | ORAL | 0 refills | Status: DC

## 2022-06-13 DIAGNOSIS — H02831 Dermatochalasis of right upper eyelid: Secondary | ICD-10-CM | POA: Diagnosis not present

## 2022-06-26 ENCOUNTER — Other Ambulatory Visit: Payer: Self-pay | Admitting: Family Medicine

## 2022-07-03 ENCOUNTER — Other Ambulatory Visit: Payer: Self-pay | Admitting: Family Medicine

## 2022-07-03 NOTE — Telephone Encounter (Signed)
Requesting: alprazolam 0.25mg   Contract:08/27/21 UDS: 08/27/21 Last Visit: 08/27/21 Next Visit: 08/28/22 Last Refill: 08/27/21 #40 and 1RF  Please Advise

## 2022-08-13 ENCOUNTER — Other Ambulatory Visit: Payer: Self-pay | Admitting: Family Medicine

## 2022-08-18 DIAGNOSIS — R638 Other symptoms and signs concerning food and fluid intake: Secondary | ICD-10-CM | POA: Diagnosis not present

## 2022-08-18 DIAGNOSIS — Z713 Dietary counseling and surveillance: Secondary | ICD-10-CM | POA: Diagnosis not present

## 2022-08-18 DIAGNOSIS — E7800 Pure hypercholesterolemia, unspecified: Secondary | ICD-10-CM | POA: Diagnosis not present

## 2022-08-27 DIAGNOSIS — E039 Hypothyroidism, unspecified: Secondary | ICD-10-CM | POA: Insufficient documentation

## 2022-08-27 NOTE — Assessment & Plan Note (Signed)
Patient encouraged to maintain heart healthy diet, regular exercise, adequate sleep. Consider daily probiotics. Take medications as prescribed. Labs ordered and reviewed.  Colonoscopy 2022, repeat in 2032 MGM, Dr Gaetano Net, OB/GYN next [facility] is next month Pap, Dr Gaetano Net, OB/GYN  Covid booster when new version out late September At pharmacy High dose flu shot mid Sept to mid Oct

## 2022-08-27 NOTE — Assessment & Plan Note (Signed)
On Levothyroxine, continue to monitor 

## 2022-08-27 NOTE — Assessment & Plan Note (Signed)
Encourage heart healthy diet such as MIND or DASH diet, increase exercise, avoid trans fats, simple carbohydrates and processed foods, consider a krill or fish or flaxseed oil cap daily.  °

## 2022-08-27 NOTE — Assessment & Plan Note (Signed)
Uses alprazolam infrequently prn with good results

## 2022-08-28 ENCOUNTER — Ambulatory Visit (INDEPENDENT_AMBULATORY_CARE_PROVIDER_SITE_OTHER): Payer: BC Managed Care – PPO | Admitting: Family Medicine

## 2022-08-28 VITALS — BP 124/80 | HR 68 | Temp 98.0°F | Resp 16 | Ht 66.0 in | Wt 236.8 lb

## 2022-08-28 DIAGNOSIS — E6609 Other obesity due to excess calories: Secondary | ICD-10-CM

## 2022-08-28 DIAGNOSIS — F419 Anxiety disorder, unspecified: Secondary | ICD-10-CM | POA: Diagnosis not present

## 2022-08-28 DIAGNOSIS — E039 Hypothyroidism, unspecified: Secondary | ICD-10-CM

## 2022-08-28 DIAGNOSIS — M25562 Pain in left knee: Secondary | ICD-10-CM | POA: Diagnosis not present

## 2022-08-28 DIAGNOSIS — E782 Mixed hyperlipidemia: Secondary | ICD-10-CM | POA: Diagnosis not present

## 2022-08-28 DIAGNOSIS — F32A Depression, unspecified: Secondary | ICD-10-CM

## 2022-08-28 DIAGNOSIS — M25561 Pain in right knee: Secondary | ICD-10-CM | POA: Diagnosis not present

## 2022-08-28 DIAGNOSIS — Z79899 Other long term (current) drug therapy: Secondary | ICD-10-CM | POA: Diagnosis not present

## 2022-08-28 DIAGNOSIS — Z0000 Encounter for general adult medical examination without abnormal findings: Secondary | ICD-10-CM | POA: Diagnosis not present

## 2022-08-28 DIAGNOSIS — M2550 Pain in unspecified joint: Secondary | ICD-10-CM

## 2022-08-28 NOTE — Assessment & Plan Note (Addendum)
She notes since stopping her Meloxicam she has increased joint pains again, hands, knees, feet with some mild swelling especially in her hands. She has notably flat feet and is offered referral to podiatry or sports med but for now will start with good shoes and orthotics. If no improvement will refer. Ice and topical rubs also. Check labs such as ANA, RF and sed rate

## 2022-08-28 NOTE — Progress Notes (Signed)
Subjective:   By signing my name below, I, Vickey SagesMohammed Iqbal, attest that this documentation has been prepared under the direction and in the presence of Bradd CanaryBlyth, Jeriel Vivanco A, MD 08/28/2022.    Patient ID: [name], female    DOB: 03/22/1971, 51 y.o.   MRN: [id number]  Chief Complaint  Patient presents with   Annual Exam    Here for Cpe and Uds   HPI Patient is in today for a comprehensive physical exam and follow up on chronic medical conditions.  She denies having any fever, chills, ear pain, headaches, muscle pain, new moles, rash, itching, congestion, sinus pain, sore throat, chest pain, palpitations, wheezing, nausea, vomitting, abdominal pain, diarrhea, constipation, blood in stool, dysuria, urgency, frequency and hematuria.  Anxiety/Depression: She is currently taking Escitalopram 20 mg and Topamax to manage her anxiety. She is interested in taking Wellbutrin.   Dermatology: She is inquiring about an actinic keratosis on her chest but will follow up on this with a dermatologist.   Family history: She reports no recent changes to her family history.   Foot pain: She complains of pain in the top of both feet. She states that standing for prolonged periods and sleeping worsens the pain. She denies redness or swelling. She further states that she has taken Aleve gel, Meloxicam, and Tylenol, but these have been nonaffective at managing the pain. She reports that the Meloxicam has also been nonaffective at managing arthritic pain in her joints.   Immunizations: She has been informed about receiving COVID-19, Flu, and Shingles immunizations. She declines to receive the Flu immunization today.   Knee pain: She complains of bilateral knee pain that worsens when climbing stairs. She states that her right knee pain worsens upon touch.   Past Medical History:  Diagnosis Date   Acute upper respiratory infections of unspecified site 08/21/2014   Allergy    seasonal allergies    Anxiety    on meds   Anxiety and depression 10/27/2011   on meds   Arthritis    lower back   GERD (gastroesophageal reflux disease)    OTC PRN-diet controlled   Granuloma annulare    bx confirmed, improved   Hx: UTI (urinary tract infection)    in childhood, resolved with urethral stretching   Hyperlipidemia 04/01/2017   diet controlled   Left flank pain 08/07/2013   Migraine 06/22/2017   Obesity 09/03/2015   Palpitations    PONV (postoperative nausea and vomiting)    Preventative health care 08/26/2011   Toe pain, right 04/07/2012   URI (upper respiratory infection) 01/09/2013   Past Surgical History:  Procedure Laterality Date   BUNIONECTOMY Left    CESAREAN SECTION     CHOLECYSTECTOMY     ECTOPIC PREGNANCY SURGERY  2001   TONSILLECTOMY     TUBAL LIGATION     URETHRAL STRICTURE DILATATION     childhood   Family History  Problem Relation Age of Onset   Hypertension Mother    Depression Father        suicide   Hypertension Father    COPD Father        smoker   Colon polyps Father 560   Alzheimer's disease Maternal Grandmother    Alzheimer's disease Maternal Grandfather    Heart disease Paternal Grandfather        MI   Hypertension Brother    Colon cancer Neg Hx    Esophageal cancer Neg Hx    Stomach cancer Neg  Hx    Rectal cancer Neg Hx    Social History   Socioeconomic History   Marital status: Married    Spouse name: Not on file   Number of children: Not on file   Years of education: Not on file   Highest education level: Not on file  Occupational History   Not on file  Tobacco Use   Smoking status: Never   Smokeless tobacco: Never  Vaping Use   Vaping Use: Never used  Substance and Sexual Activity   Alcohol use: No   Drug use: No   Sexual activity: Yes    Comment: lives with husband and twins. 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 no dietary restrictions  Other Topics Concern   Not on file  Social History Narrative   Not on file   Social Determinants of Health    Financial Resource Strain: Not on file  Food Insecurity: Not on file  Transportation Needs: Not on file  Physical Activity: Not on file  Stress: Not on file  Social Connections: Not on file  Intimate Partner Violence: Not on file   Outpatient Medications Prior to Visit  Medication Sig Dispense Refill   ALPRAZolam (XANAX) 0.25 MG tablet TAKE 1 TABLET BY MOUTH 2 TIMES DAILY AS NEEDED. 40 tablet 1   escitalopram (LEXAPRO) 20 MG tablet TAKE 1 TABLET BY MOUTH EVERY DAY 90 tablet 1   fluticasone (FLONASE) 50 MCG/ACT nasal spray Place 2 sprays into both nostrils daily. 16 g 6   levothyroxine (SYNTHROID) 25 MCG tablet TAKE 1 TABLET BY MOUTH EVERY DAY BEFORE BREAKFAST 90 tablet 1   meloxicam (MOBIC) 15 MG tablet TAKE 1 TABLET (15 MG TOTAL) BY MOUTH DAILY. 30 tablet 0   omeprazole (PRILOSEC) 20 MG capsule TAKE 1 CAPSULE BY MOUTH EVERY DAY AS NEEDED 90 capsule 1   No facility-administered medications prior to visit.   Allergies  Allergen Reactions   Contrast Media [Iodinated Contrast Media]    Review of Systems  Constitutional:  Negative for chills and fever.  HENT:  Negative for congestion, ear pain, sinus pain and sore throat.   Respiratory:  Negative for cough, shortness of breath and wheezing.   Cardiovascular:  Negative for chest pain and palpitations.  Gastrointestinal:  Negative for abdominal pain, blood in stool, constipation, diarrhea, nausea and vomiting.  Genitourinary:  Negative for dysuria, frequency, hematuria and urgency.  Musculoskeletal:  Positive for joint pain. Negative for myalgias.  Skin:  Negative for itching and rash.       (-) New moles.  Neurological:  Negative for headaches.      Objective:    Physical Exam Constitutional:      General: She is not in acute distress.    Appearance: Normal appearance. She is not ill-appearing.  HENT:     Head: Normocephalic and atraumatic.     Right Ear: Tympanic membrane, ear canal and external ear normal.     Left Ear:  Tympanic membrane, ear canal and external ear normal.     Mouth/Throat:     Mouth: Mucous membranes are moist.     Pharynx: Oropharynx is clear.  Eyes:     Extraocular Movements: Extraocular movements intact.     Right eye: No nystagmus.     Left eye: No nystagmus.     Pupils: Pupils are equal, round, and reactive to light.  Neck:     Vascular: No carotid bruit.  Cardiovascular:     Rate and Rhythm: Normal rate and regular  rhythm.     Pulses: Normal pulses.     Heart sounds: Normal heart sounds. No murmur heard.    No gallop.  Pulmonary:     Effort: Pulmonary effort is normal. No respiratory distress.     Breath sounds: Normal breath sounds. No wheezing or rales.  Abdominal:     General: Bowel sounds are normal.     Tenderness: There is no abdominal tenderness.  Musculoskeletal:     Comments: Muscle strength 5/5 on upper and lower extremities.   Lymphadenopathy:     Cervical: No cervical adenopathy.  Skin:    General: Skin is warm and dry.  Neurological:     Mental Status: She is alert and oriented to person, place, and time.     Sensory: Sensation is intact.     Motor: Motor function is intact.     Coordination: Coordination is intact.     Deep Tendon Reflexes:     Reflex Scores:      Patellar reflexes are 2+ on the right side and 2+ on the left side. Psychiatric:        Mood and Affect: Mood normal.        Behavior: Behavior normal.        Judgment: Judgment normal.    BP 124/80 (BP Location: Right Arm, Patient Position: Sitting, Cuff Size: Small)   Pulse 68   Temp 98 F (36.7 C) (Oral)   Resp 16   Ht  (1.676 m)   Wt 236 lb 12.8 oz (107.4 kg)   SpO2 98%   BMI 38.22 kg/m  Wt Readings from Last 3 Encounters:  08/28/22 236 lb 12.8 oz (107.4 kg)  04/07/22 226 lb 4 oz (102.6 kg)  01/20/22 234 lb (106.1 kg)   Diabetic Foot Exam - Simple   No data filed    Lab Results  Component Value Date   WBC 5.3 08/27/2021   HGB 12.7 08/27/2021   HCT 37.7  08/27/2021   PLT 313.0 08/27/2021   GLUCOSE 87 08/27/2021   CHOL 199 08/27/2021   TRIG 100.0 08/27/2021   HDL 51.40 08/27/2021   LDLDIRECT 125.0 06/20/2019   LDLCALC 127 (H) 08/27/2021   ALT 18 08/27/2021   AST 20 08/27/2021   NA 139 08/27/2021   K 4.4 08/27/2021   CL 102 08/27/2021   CREATININE 0.99 08/27/2021   BUN 15 08/27/2021   CO2 31 08/27/2021   TSH 2.84 08/27/2021   Lab Results  Component Value Date   TSH 2.84 08/27/2021   Lab Results  Component Value Date   WBC 5.3 08/27/2021   HGB 12.7 08/27/2021   HCT 37.7 08/27/2021   MCV 85.5 08/27/2021   PLT 313.0 08/27/2021   Lab Results  Component Value Date   NA 139 08/27/2021   K 4.4 08/27/2021   CO2 31 08/27/2021   GLUCOSE 87 08/27/2021   BUN 15 08/27/2021   CREATININE 0.99 08/27/2021   BILITOT 0.6 08/27/2021   ALKPHOS 76 08/27/2021   AST 20 08/27/2021   ALT 18 08/27/2021   PROT 7.4 08/27/2021   ALBUMIN 4.1 08/27/2021   CALCIUM 9.3 08/27/2021   GFR 65.97 08/27/2021   Lab Results  Component Value Date   CHOL 199 08/27/2021   Lab Results  Component Value Date   HDL 51.40 08/27/2021   Lab Results  Component Value Date   LDLCALC 127 (H) 08/27/2021   Lab Results  Component Value Date   TRIG 100.0 08/27/2021  Lab Results  Component Value Date   CHOLHDL 4 08/27/2021   No results found for: "HGBA1C"  Colonoscopy: Last completed on 02/15/2021.  - The entire examined colon is normal. - The distal rectum and anal verge are normal on retroflexion view. - No specimens collected. Repeat in 10 years.   Pap Smear: Last completed on 10/30/2014. Repeat in 3-5 years. Need information.   Mammogram: Last completed on 07/07/2014.  -No mammographic or sonographic findings of malignancy in the right breast.  - A benign minimally complicated cyst in the upper outer quadrant of the right breast at 10 o'clock 8 cm from the nipple.  Mammogram scheduled for 09/2022.     Assessment & Plan:   Problem List Items  Addressed This Visit     Preventative health care    Patient encouraged to maintain heart healthy diet, regular exercise, adequate sleep. Consider daily probiotics. Take medications as prescribed. Labs ordered and reviewed.  Colonoscopy 2022, repeat in 2032 MGM, Dr Henderson Cloud, OB/GYN next [facility] is next month Pap, Dr Henderson Cloud, OB/GYN  Covid booster when new version out late September At pharmacy High dose flu shot mid Sept to mid Oct      Relevant Orders   Comprehensive metabolic panel   CBC with Differential/Platelet   Lipid panel   TSH   Anxiety and depression    Uses alprazolam infrequently prn with good results      Obesity    Encouraged DASH or MIND diet, decrease po intake and increase exercise as tolerated. Needs 7-8 hours of sleep nightly. Avoid trans fats, eat small, frequent meals every 4-5 hours with lean proteins, complex carbs and healthy fats. Minimize simple carbs, high fat foods and processed foods      Hyperlipidemia    Encourage heart healthy diet such as MIND or DASH diet, increase exercise, avoid trans fats, simple carbohydrates and processed foods, consider a krill or fish or flaxseed oil cap daily.       Relevant Orders   Comprehensive metabolic panel   CBC with Differential/Platelet   Lipid panel   TSH   Arthralgia    She notes since stopping her Meloxicam she has increased joint pains again, hands, knees, feet with some mild swelling especially in her hands. She has notably flat feet and is offered referral to podiatry or sports med but for now will start with good shoes and orthotics. If no improvement will refer. Ice and topical rubs also. Check labs such as ANA, RF and sed rate      Hypothyroid    On Levothyroxine, continue to monitor      Other Visit Diagnoses     High risk medication use    -  Primary   Relevant Orders   Drug Monitoring Panel [id number] , Urine   Pain in both knees, unspecified chronicity       Relevant Orders   Rheumatoid Factor    Antinuclear Antib (ANA)   Sedimentation rate      No orders of the defined types were placed in this encounter.  I, Danise Edge, MD, personally preformed the services described in this documentation.  All medical record entries made by the scribe were at my direction and in my presence.  I have reviewed the chart and discharge instructions (if applicable) and agree that the record reflects my personal performance and is accurate and complete. 08/28/2022  I,Mohammed Iqbal,acting as a scribe for Danise Edge, MD.,have documented all relevant documentation on the behalf of Misty Stanley  Charlett Blake, MD,as directed by  Penni Homans, MD while in the presence of Penni Homans, MD.  Penni Homans, MD

## 2022-08-28 NOTE — Assessment & Plan Note (Signed)
Encouraged DASH or MIND diet, decrease po intake and increase exercise as tolerated. Needs 7-8 hours of sleep nightly. Avoid trans fats, eat small, frequent meals every 4-5 hours with lean proteins, complex carbs and healthy fats. Minimize simple carbs, high fat foods and processed foods 

## 2022-08-28 NOTE — Patient Instructions (Addendum)
Shingrix is the new shingles shot, 2 shots over 2-6 months, confirm coverage with insurance and document, then can return here for shots with nurse appt or at [facility] do not drink Cell phones? Covid booster when new version out late September At pharmacy flu shot mid Sept to mid Oct  Multivitamin with minerals and fish oil cap daily 60-80 ounces of clear fluids  Preventive Care 73-52 Years Old, Female Preventive care refers to lifestyle choices and visits with your health care provider that can promote health and wellness. Preventive care visits are also called wellness exams. What can I expect for my preventive care visit? Counseling Your health care provider may ask you questions about your: Medical history, including: Past medical problems. Family medical history. Pregnancy history. Current health, including: Menstrual cycle. Method of birth control. Emotional well-being. Home life and relationship well-being. Sexual activity and sexual health. Lifestyle, including: Alcohol, nicotine or tobacco, and drug use. Access to firearms. Diet, exercise, and sleep habits. Work and work Statistician. Sunscreen use. Safety issues such as seatbelt and bike helmet use. Physical exam Your health care provider will check your: Height and weight. These may be used to calculate your BMI (body mass index). BMI is a measurement that tells if you are at a healthy weight. Waist circumference. This measures the distance around your waistline. This measurement also tells if you are at a healthy weight and may help predict your risk of certain diseases, such as type 2 diabetes and high blood pressure. Heart rate and blood pressure. Body temperature. Skin for abnormal spots. What immunizations do I need?  Vaccines are usually given at various ages, according to a schedule. Your health care provider will recommend vaccines for you based on your age, medical history, and lifestyle or  other factors, such as travel or where you work. What tests do I need? Screening Your health care provider may recommend screening tests for certain conditions. This may include: Lipid and cholesterol levels. Diabetes screening. This is done by checking your blood sugar (glucose) after you have not eaten for a while (fasting). Pelvic exam and Pap test. Hepatitis B test. Hepatitis C test. HIV (human immunodeficiency virus) test. STI (sexually transmitted infection) testing, if you are at risk. Lung cancer screening. Colorectal cancer screening. Mammogram. Talk with your health care provider about when you should start having regular mammograms. This may depend on whether you have a family history of breast cancer. BRCA-related cancer screening. This may be done if you have a family history of breast, ovarian, tubal, or peritoneal cancers. Bone density scan. This is done to screen for osteoporosis. Talk with your health care provider about your test results, treatment options, and if necessary, the need for more tests. Follow these instructions at home: Eating and drinking  Eat a diet that includes fresh fruits and vegetables, whole grains, lean protein, and low-fat dairy products. Take vitamin and mineral supplements as recommended by your health care provider. Do not drink alcohol if: Your health care provider tells you not to drink. You are pregnant, may be pregnant, or are planning to become pregnant. If you drink alcohol: Limit how much you have to 0-1 drink a day. Know how much alcohol is in your drink. In the [place] one drink equals one 12 oz bottle of beer (355 mL), one 5 oz glass of wine (148 mL), or one 1 oz glass of hard liquor (44 mL). Lifestyle Brush your teeth every morning and night with fluoride toothpaste.  Floss one time each day. Exercise for at least 30 minutes 5 or more days each week. Do not use any products that contain nicotine or tobacco. These products include  cigarettes, chewing tobacco, and vaping devices, such as e-cigarettes. If you need help quitting, ask your health care provider. Do not use drugs. If you are sexually active, practice safe sex. Use a condom or other form of protection to prevent STIs. If you do not wish to become pregnant, use a form of birth control. If you plan to become pregnant, see your health care provider for a prepregnancy visit. Take aspirin only as told by your health care provider. Make sure that you understand how much to take and what form to take. Work with your health care provider to find out whether it is safe and beneficial for you to take aspirin daily. Find healthy ways to manage stress, such as: Meditation, yoga, or listening to music. Journaling. Talking to a trusted person. Spending time with friends and family. Minimize exposure to UV radiation to reduce your risk of skin cancer. Safety Always wear your seat belt while driving or riding in a vehicle. Do not drive: If you have been drinking alcohol. Do not ride with someone who has been drinking. When you are tired or distracted. While texting. If you have been using any mind-altering substances or drugs. Wear a helmet and other protective equipment during sports activities. If you have firearms in your house, make sure you follow all gun safety procedures. Seek help if you have been physically or sexually abused. What's next? Visit your health care provider once a year for an annual wellness visit. Ask your health care provider how often you should have your eyes and teeth checked. Stay up to date on all vaccines. This information is not intended to replace advice given to you by your health care provider. Make sure you discuss any questions you have with your health care provider. Document Revised: 05/22/2021 Document Reviewed: 05/22/2021 Elsevier Patient Education  [place].

## 2022-08-29 ENCOUNTER — Other Ambulatory Visit: Payer: Self-pay

## 2022-08-29 ENCOUNTER — Encounter: Payer: Self-pay | Admitting: Family Medicine

## 2022-08-29 LAB — COMPREHENSIVE METABOLIC PANEL
ALT: 13 U/L (ref 0–35)
AST: 16 U/L (ref 0–37)
Albumin: 3.9 g/dL (ref 3.5–5.2)
Alkaline Phosphatase: 66 U/L (ref 39–117)
BUN: 23 mg/dL (ref 6–23)
CO2: 28 mEq/L (ref 19–32)
Calcium: 9.1 mg/dL (ref 8.4–10.5)
Chloride: 103 mEq/L (ref 96–112)
Creatinine, Ser: 0.98 mg/dL (ref 0.40–1.20)
GFR: 66.31 mL/min (ref >60.00)
Glucose, Bld: 82 mg/dL (ref 70–99)
Potassium: 3.9 mEq/L (ref 3.5–5.1)
Sodium: 137 mEq/L (ref 135–145)
Total Bilirubin: 0.3 mg/dL (ref 0.2–1.2)
Total Protein: 7.2 g/dL (ref 6.0–8.3)

## 2022-08-29 LAB — CBC WITH DIFFERENTIAL/PLATELET
Basophils Absolute: 0 10*3/uL (ref 0.0–0.1)
Basophils Relative: 0.8 % (ref 0.0–3.0)
Eosinophils Absolute: 0.2 10*3/uL (ref 0.0–0.7)
Eosinophils Relative: 2.7 % (ref 0.0–5.0)
HCT: 36.3 % (ref 36.0–46.0)
Hemoglobin: 12.3 g/dL (ref 12.0–15.0)
Lymphocytes Relative: 32.9 % (ref 12.0–46.0)
Lymphs Abs: 2 10*3/uL (ref 0.7–4.0)
MCHC: 33.8 g/dL (ref 30.0–36.0)
MCV: 85.4 fl (ref 78.0–100.0)
Monocytes Absolute: 0.5 10*3/uL (ref 0.1–1.0)
Monocytes Relative: 8.5 % (ref 3.0–12.0)
Neutro Abs: 3.3 10*3/uL (ref 1.4–7.7)
Neutrophils Relative %: 55.1 % (ref 43.0–77.0)
Platelets: 252 10*3/uL (ref 150.0–400.0)
RBC: 4.25 Mil/uL (ref 3.87–5.11)
RDW: 14.2 % (ref 11.5–15.5)
WBC: 6.1 10*3/uL (ref 4.0–10.5)

## 2022-08-29 LAB — LIPID PANEL
Cholesterol: 161 mg/dL (ref 0–200)
HDL: 47.5 mg/dL (ref >39.00)
LDL Cholesterol: 87 mg/dL (ref 0–99)
NonHDL: 113.55
Total CHOL/HDL Ratio: 3
Triglycerides: 132 mg/dL (ref 0.0–149.0)
VLDL: 26.4 mg/dL (ref 0.0–40.0)

## 2022-08-29 LAB — RHEUMATOID FACTOR: Rheumatoid fact SerPl-aCnc: <14 IU/mL (ref <14)

## 2022-08-29 LAB — SEDIMENTATION RATE: Sed Rate: 13 mm/hr (ref 0–30)

## 2022-08-29 LAB — TSH: TSH: 2.87 u[IU]/mL (ref 0.35–5.50)

## 2022-08-29 LAB — ANA: Anti Nuclear Antibody (ANA): NEGATIVE

## 2022-08-31 LAB — DRUG MONITORING PANEL 376104, URINE
Amphetamines: NEGATIVE ng/mL (ref <500)
Barbiturates: NEGATIVE ng/mL (ref <300)
Benzodiazepines: NEGATIVE ng/mL (ref <100)
Cocaine Metabolite: NEGATIVE ng/mL (ref <150)
Desmethyltramadol: NEGATIVE ng/mL (ref <100)
Opiates: NEGATIVE ng/mL (ref <100)
Oxycodone: NEGATIVE ng/mL (ref <100)
Tramadol: NEGATIVE ng/mL (ref <100)

## 2022-08-31 LAB — DM TEMPLATE

## 2022-09-01 ENCOUNTER — Other Ambulatory Visit: Payer: Self-pay | Admitting: Family Medicine

## 2022-09-01 MED ORDER — BUPROPION HCL ER (XL) 150 MG PO TB24: 150.0000 mg | ORAL_TABLET | Freq: Every day | ORAL | 1 refills | Status: DC

## 2022-09-01 NOTE — Telephone Encounter (Signed)
Called pt and appt was made 

## 2022-09-02 NOTE — Progress Notes (Unsigned)
    [name] D.[place] Phone: [phone]   Assessment and Plan:     There are no diagnoses linked to this encounter.  ***   Pertinent previous records reviewed include ***   Follow Up: ***     Subjective:   I, [name], am serving as a Education administrator for Doctor Glennon Mac  Chief Complaint: knee and foot pain  HPI:   09/03/2022 Patient is a 51 year old female complaining of knee and foot pain. Patient states  Relevant Historical Information: ***  Additional pertinent review of systems negative.   Current Outpatient Medications:    ALPRAZolam (XANAX) 0.25 MG tablet, TAKE 1 TABLET BY MOUTH 2 TIMES DAILY AS NEEDED., Disp: 40 tablet, Rfl: 1   buPROPion (WELLBUTRIN XL) 150 MG 24 hr tablet, Take 1 tablet (150 mg total) by mouth daily., Disp: 30 tablet, Rfl: 1   escitalopram (LEXAPRO) 20 MG tablet, TAKE 1 TABLET BY MOUTH EVERY DAY, Disp: 90 tablet, Rfl: 1   fluticasone (FLONASE) 50 MCG/ACT nasal spray, Place 2 sprays into both nostrils daily., Disp: 16 g, Rfl: 6   levothyroxine (SYNTHROID) 25 MCG tablet, TAKE 1 TABLET BY MOUTH EVERY DAY BEFORE BREAKFAST, Disp: 90 tablet, Rfl: 1   meloxicam (MOBIC) 15 MG tablet, TAKE 1 TABLET (15 MG TOTAL) BY MOUTH DAILY., Disp: 30 tablet, Rfl: 0   omeprazole (PRILOSEC) 20 MG capsule, TAKE 1 CAPSULE BY MOUTH EVERY DAY AS NEEDED, Disp: 90 capsule, Rfl: 1   Objective:     There were no vitals filed for this visit.    There is no height or weight on file to calculate BMI.    Physical Exam:    ***   Electronically signed by:  [name] D.[facility] 8:04 AM 09/04/22

## 2022-09-03 ENCOUNTER — Encounter: Payer: BC Managed Care – PPO | Admitting: Sports Medicine

## 2022-09-04 ENCOUNTER — Ambulatory Visit (INDEPENDENT_AMBULATORY_CARE_PROVIDER_SITE_OTHER): Payer: BC Managed Care – PPO

## 2022-09-04 ENCOUNTER — Ambulatory Visit (INDEPENDENT_AMBULATORY_CARE_PROVIDER_SITE_OTHER): Payer: BC Managed Care – PPO | Admitting: Sports Medicine

## 2022-09-04 VITALS — BP 132/82 | HR 85 | Ht 66.0 in | Wt 236.0 lb

## 2022-09-04 DIAGNOSIS — M79672 Pain in left foot: Secondary | ICD-10-CM

## 2022-09-04 DIAGNOSIS — M1711 Unilateral primary osteoarthritis, right knee: Secondary | ICD-10-CM

## 2022-09-04 DIAGNOSIS — M79671 Pain in right foot: Secondary | ICD-10-CM | POA: Diagnosis not present

## 2022-09-04 DIAGNOSIS — M25561 Pain in right knee: Secondary | ICD-10-CM

## 2022-09-04 DIAGNOSIS — M19071 Primary osteoarthritis, right ankle and foot: Secondary | ICD-10-CM | POA: Diagnosis not present

## 2022-09-04 DIAGNOSIS — M25562 Pain in left knee: Secondary | ICD-10-CM | POA: Diagnosis not present

## 2022-09-04 DIAGNOSIS — M2142 Flat foot [pes planus] (acquired), left foot: Secondary | ICD-10-CM

## 2022-09-04 DIAGNOSIS — M2141 Flat foot [pes planus] (acquired), right foot: Secondary | ICD-10-CM | POA: Diagnosis not present

## 2022-09-04 DIAGNOSIS — M1712 Unilateral primary osteoarthritis, left knee: Secondary | ICD-10-CM | POA: Diagnosis not present

## 2022-09-04 DIAGNOSIS — G8929 Other chronic pain: Secondary | ICD-10-CM

## 2022-09-04 NOTE — Patient Instructions (Addendum)
Good to see you  Discontinue Meloxicam Knee and foot HEP  Recommend stopping by [facility] to get arch support inserts  3-4 week follow up

## 2022-09-04 NOTE — Progress Notes (Signed)
This encounter was created in error - please disregard.

## 2022-09-04 NOTE — Progress Notes (Signed)
[name] D.Smith [name] Phone: [phone]   Assessment and Plan:     1. Bilateral foot pain 2. Pes planus of both feet -Chronic with exacerbation, initial visit - Likely bilateral pes planus creating tension and pressure through tarsal bones - Recommend wearing supportive footwear including arch support inserts at all times - May use topical Voltaren gel as needed - Recommend discontinuing meloxicam since patient tried daily use for 1 month without significant improvement in symptoms - Start HEP for intrinsic foot muscles - X-rays obtained in clinic.  My interpretation: No acute fracture or dislocation.  Mild cortical changes through the midfoot and pes planus  3. Chronic pain of right knee 4. Primary osteoarthritis of right knee  -Chronic with exacerbation, initial sports medicine visit - Most consistent with arthritic changes of lateral patella causing sharp and pinpoint pain - X-ray obtained in clinic.  My interpretation: No acute fracture or dislocation.  Spurring of the lateral patella and superior patella - Patient did not receive relief with 1 month course of meloxicam.  Recommend discontinue meloxicam at this time - Patient elected for intra-articular CSI.  Tolerated well per note below.  Procedure: Knee Joint Injection Side: Right Indication: Right knee osteoarthritis flare  Risks explained and consent was given verbally. The site was cleaned with alcohol prep. A needle was introduced with an anterio-lateral approach. Injection given using 3mL of 1% lidocaine without epinephrine and 50mL of kenalog 40mg /ml. This was well tolerated and resulted in symptomatic relief.  Needle was removed, hemostasis achieved, and post injection instructions were explained.   Pt was advised to call or return to clinic if these symptoms worsen or fail to improve as anticipated.   Pertinent previous records reviewed  include none   Follow Up: 3 to 4 weeks for reevaluation.  Could consider midfoot CSI versus alternative injections for knee if no improvement or worsening of symptoms   Subjective:   I, [name], am serving as a Education administrator for Doctor [name]   Chief Complaint: knee and foot pain   HPI:    09/04/2022 Patient is a 51 year old female complaining of knee and foot pain. Patient states that for a couple of months her right knee has been bothering her, TTP at a certain spot.   Her feet just hurt the tops of them feel like the bones are broken ,  been going on for several months   Has tried meloxicam  and cant tell a difference along with advil no numbness or tingling, no MOI , she states she has gained weight     Additional pertinent review of systems negative.   Current Outpatient Medications:    ALPRAZolam (XANAX) 0.25 MG tablet, TAKE 1 TABLET BY MOUTH 2 TIMES DAILY AS NEEDED., Disp: 40 tablet, Rfl: 1   buPROPion (WELLBUTRIN XL) 150 MG 24 hr tablet, Take 1 tablet (150 mg total) by mouth daily., Disp: 30 tablet, Rfl: 1   escitalopram (LEXAPRO) 20 MG tablet, TAKE 1 TABLET BY MOUTH EVERY DAY, Disp: 90 tablet, Rfl: 1   fluticasone (FLONASE) 50 MCG/ACT nasal spray, Place 2 sprays into both nostrils daily., Disp: 16 g, Rfl: 6   levothyroxine (SYNTHROID) 25 MCG tablet, TAKE 1 TABLET BY MOUTH EVERY DAY BEFORE BREAKFAST, Disp: 90 tablet, Rfl: 1   omeprazole (PRILOSEC) 20 MG capsule, TAKE 1 CAPSULE BY MOUTH EVERY DAY AS NEEDED, Disp: 90 capsule, Rfl: 1   Objective:  Vitals:   09/04/22 1319  BP: 132/82  Pulse: 85  SpO2: 96%  Weight: 236 lb (107 kg)  Height: 5\' 6"  (1.676 m)      Body mass index is 38.09 kg/m.    Physical Exam:    General:  awake, alert oriented, no acute distress nontoxic Skin: no suspicious lesions or rashes Neuro:sensation intact, no deficits, strength 5/5 with no deficits, no atrophy, normal muscle tone Psych: No signs of anxiety, depression or other  mood disorder  Right knee: No swelling No deformity Neg fluid wave, joint milking ROM Flex 110 , Ext 0  TTP lateral patella NTTP over the quad tendon, medial fem condyle, lat fem condyle, patella, plica, patella tendon, tibial tuberostiy, fibular head, posterior fossa, pes anserine bursa, gerdy's tubercle, medial jt line, lateral jt line Neg anterior and posterior drawer Neg lachman Neg sag sign Negative varus stress Negative valgus stress Negative McMurray Negative Thessaly  Gait normal     Bilateral foot/ankle: no deformity, no swelling or effusion Pes planus TTP mildly midfoot NTTP over fibular head, lat mal, medial mal, achilles, navicular, base of 5th, ATFL, CFL, deltoid, calcaneous or   ROM DF 30, PF 45, inv/ev intact Negative ant drawer, talar tilt, rotation test, squeeze test. Neg thompson No pain with resisted inversion or eversion   Electronically signed by:  D.Aleen Sells Sports [facility] 1:55 PM 09/04/22

## 2022-09-11 DIAGNOSIS — N912 Amenorrhea, unspecified: Secondary | ICD-10-CM | POA: Diagnosis not present

## 2022-09-19 ENCOUNTER — Other Ambulatory Visit: Payer: Self-pay | Admitting: Family Medicine

## 2022-09-23 DIAGNOSIS — E7800 Pure hypercholesterolemia, unspecified: Secondary | ICD-10-CM | POA: Diagnosis not present

## 2022-09-23 DIAGNOSIS — Z6837 Body mass index (BMI) 37.0-37.9, adult: Secondary | ICD-10-CM | POA: Diagnosis not present

## 2022-09-23 DIAGNOSIS — Z713 Dietary counseling and surveillance: Secondary | ICD-10-CM | POA: Diagnosis not present

## 2022-09-24 ENCOUNTER — Other Ambulatory Visit: Payer: Self-pay | Admitting: Family Medicine

## 2022-09-26 NOTE — Progress Notes (Deleted)
    Benito Mccreedy D.[employer] [place] Phone: [phone]   Assessment and Plan:     There are no diagnoses linked to this encounter.  ***   Pertinent previous records reviewed include ***   Follow Up: ***     Subjective:   I, Zoriah Pulice, am serving as a Education administrator for Doctor Glennon Mac   Chief Complaint: knee and foot pain   HPI:    09/04/2022 Patient is a 51 year old female complaining of knee and foot pain. Patient states that for a couple of months her right knee has been bothering her, TTP at a certain spot.    Her feet just hurt the tops of them feel like the bones are broken ,  been going on for several months    Has tried meloxicam  and cant tell a difference along with advil no numbness or tingling, no MOI , she states she has gained weight  09/29/2022 Patient states    Relevant Historical Information: ***  Additional pertinent review of systems negative.   Current Outpatient Medications:    ALPRAZolam (XANAX) 0.25 MG tablet, TAKE 1 TABLET BY MOUTH 2 TIMES DAILY AS NEEDED., Disp: 40 tablet, Rfl: 1   buPROPion (WELLBUTRIN XL) 150 MG 24 hr tablet, Take 1 tablet (150 mg total) by mouth daily., Disp: 90 tablet, Rfl: 0   escitalopram (LEXAPRO) 20 MG tablet, TAKE 1 TABLET BY MOUTH EVERY DAY, Disp: 90 tablet, Rfl: 1   fluticasone (FLONASE) 50 MCG/ACT nasal spray, Place 2 sprays into both nostrils daily., Disp: 16 g, Rfl: 6   levothyroxine (SYNTHROID) 25 MCG tablet, TAKE 1 TABLET BY MOUTH EVERY DAY BEFORE BREAKFAST, Disp: 90 tablet, Rfl: 1   omeprazole (PRILOSEC) 20 MG capsule, TAKE 1 CAPSULE BY MOUTH EVERY DAY AS NEEDED, Disp: 90 capsule, Rfl: 1   Objective:     There were no vitals filed for this visit.    There is no height or weight on file to calculate BMI.    Physical Exam:    ***   Electronically signed by:  Benito Mccreedy D.Marguerita Merles Sports [facility] 7:44 AM 09/26/22

## 2022-09-29 ENCOUNTER — Ambulatory Visit: Payer: BC Managed Care – PPO | Admitting: Sports Medicine

## 2022-11-20 ENCOUNTER — Telehealth: Payer: BC Managed Care – PPO | Admitting: Family Medicine

## 2022-11-28 DIAGNOSIS — J019 Acute sinusitis, unspecified: Secondary | ICD-10-CM | POA: Diagnosis not present

## 2022-11-28 DIAGNOSIS — Z03818 Encounter for observation for suspected exposure to other biological agents ruled out: Secondary | ICD-10-CM | POA: Diagnosis not present

## 2022-11-28 DIAGNOSIS — R0981 Nasal congestion: Secondary | ICD-10-CM | POA: Diagnosis not present

## 2022-12-25 ENCOUNTER — Other Ambulatory Visit: Payer: Self-pay | Admitting: Family Medicine

## 2023-01-05 ENCOUNTER — Other Ambulatory Visit: Payer: Self-pay | Admitting: Family Medicine

## 2023-01-06 NOTE — Telephone Encounter (Signed)
Requesting: alprazolam 0.25mg  Contract: Yes  UDS: 08/28/22 Last Visit: 08/28/2022 Next Visit: 02/26/2023 Last Refill: 07/03/22  Please Advise

## 2023-02-12 LAB — HM MAMMOGRAPHY

## 2023-02-20 ENCOUNTER — Other Ambulatory Visit: Payer: Self-pay | Admitting: Family Medicine

## 2023-02-26 ENCOUNTER — Ambulatory Visit: Payer: BC Managed Care – PPO | Admitting: Family Medicine

## 2023-03-11 ENCOUNTER — Encounter: Payer: Self-pay | Admitting: Family Medicine

## 2023-03-24 ENCOUNTER — Other Ambulatory Visit: Payer: Self-pay | Admitting: Family Medicine

## 2023-04-14 ENCOUNTER — Ambulatory Visit: Payer: BC Managed Care – PPO | Admitting: Family Medicine

## 2023-05-27 LAB — HM PAP SMEAR

## 2023-06-23 ENCOUNTER — Other Ambulatory Visit: Payer: Self-pay | Admitting: Family Medicine

## 2023-07-09 ENCOUNTER — Other Ambulatory Visit: Payer: Self-pay | Admitting: Family Medicine

## 2023-07-31 ENCOUNTER — Other Ambulatory Visit: Payer: Self-pay | Admitting: Family Medicine

## 2023-08-04 ENCOUNTER — Other Ambulatory Visit: Payer: Self-pay | Admitting: Family Medicine

## 2023-10-03 ENCOUNTER — Other Ambulatory Visit: Payer: Self-pay | Admitting: Family Medicine

## 2023-10-05 ENCOUNTER — Encounter: Payer: Self-pay | Admitting: *Deleted

## 2023-10-26 ENCOUNTER — Encounter: Payer: Self-pay | Admitting: *Deleted

## 2023-11-01 ENCOUNTER — Other Ambulatory Visit: Payer: Self-pay | Admitting: Family Medicine

## 2023-11-05 ENCOUNTER — Other Ambulatory Visit: Payer: Self-pay | Admitting: Family Medicine

## 2023-11-10 ENCOUNTER — Encounter: Payer: Self-pay | Admitting: Emergency Medicine

## 2023-11-18 ENCOUNTER — Encounter: Payer: Self-pay | Admitting: Family

## 2023-11-18 ENCOUNTER — Ambulatory Visit (INDEPENDENT_AMBULATORY_CARE_PROVIDER_SITE_OTHER): Payer: No Typology Code available for payment source | Admitting: Family

## 2023-11-18 VITALS — BP 113/61 | HR 78 | Temp 97.6°F | Resp 16 | Ht 67.5 in | Wt 231.0 lb

## 2023-11-18 DIAGNOSIS — Z780 Asymptomatic menopausal state: Secondary | ICD-10-CM

## 2023-11-18 DIAGNOSIS — F32A Depression, unspecified: Secondary | ICD-10-CM

## 2023-11-18 DIAGNOSIS — E782 Mixed hyperlipidemia: Secondary | ICD-10-CM

## 2023-11-18 DIAGNOSIS — E039 Hypothyroidism, unspecified: Secondary | ICD-10-CM

## 2023-11-18 DIAGNOSIS — Z0000 Encounter for general adult medical examination without abnormal findings: Secondary | ICD-10-CM | POA: Diagnosis not present

## 2023-11-18 DIAGNOSIS — Z1159 Encounter for screening for other viral diseases: Secondary | ICD-10-CM

## 2023-11-18 DIAGNOSIS — K219 Gastro-esophageal reflux disease without esophagitis: Secondary | ICD-10-CM

## 2023-11-18 DIAGNOSIS — F419 Anxiety disorder, unspecified: Secondary | ICD-10-CM | POA: Diagnosis not present

## 2023-11-18 DIAGNOSIS — G43909 Migraine, unspecified, not intractable, without status migrainosus: Secondary | ICD-10-CM

## 2023-11-18 MED ORDER — ESCITALOPRAM OXALATE 20 MG PO TABS: 20.0000 mg | ORAL_TABLET | Freq: Every day | ORAL | 1 refills | Status: DC

## 2023-11-18 MED ORDER — BUPROPION HCL ER (XL) 150 MG PO TB24: 150.0000 mg | ORAL_TABLET | Freq: Every day | ORAL | 1 refills | Status: DC

## 2023-11-18 MED ORDER — OMEPRAZOLE 20 MG PO CPDR: 20.0000 mg | DELAYED_RELEASE_CAPSULE | Freq: Every day | ORAL | 4 refills | Status: DC | PRN

## 2023-11-18 NOTE — Assessment & Plan Note (Signed)
Stable on omeprazole. 

## 2023-11-18 NOTE — Assessment & Plan Note (Addendum)
Stable on lexapro and wellbutrin. Continue same.  Uses xanax very rarely. Contract updated.

## 2023-11-18 NOTE — Assessment & Plan Note (Signed)
Stable with prn use of excedrin migraine.

## 2023-11-18 NOTE — Assessment & Plan Note (Signed)
Lab Results  Component Value Date   CHOL 161 08/28/2022   HDL 47.50 08/28/2022   LDLCALC 87 08/28/2022   LDLDIRECT 125.0 06/20/2019   TRIG 132.0 08/28/2022   CHOLHDL 3 08/28/2022

## 2023-11-18 NOTE — Assessment & Plan Note (Signed)
We discussed that brain fog is a common symptom with menopause.  Will monitor for now. She is not interested in HRT.

## 2023-11-18 NOTE — Progress Notes (Signed)
Subjective:     Patient ID: [name], female    DOB: October 09, 1971, 52 y.o.   MRN: [id number]  Chief Complaint  Patient presents with   Annual Exam    HPI  Discussed the use of AI scribe software for clinical note transcription with the patient, who gave verbal consent to proceed.  History of Present Illness   The patient, with a history of anxiety, GERD, and hypothyroidism, presents for an annual physical. She reports no recent changes in health status. She has not been following a weight loss program and admits to needing to lose about 50 pounds. She is more active in the spring and enjoys walking when the weather is warmer. She has not been disciplined with exercise recently.  The patient has not had a period in a year and has noticed increased forgetfulness, which she finds concerning given her previous excellent memory. She has not had any recent migraines. She takes Xanax as needed for sleep, but the prescription has expired. She has not taken it recently. She feels fine on her current dose of Synthroid. She takes Prilosec for reflux, which helps, and she does not take it every day.  The patient is a Midwife and has noticed no issues with memory in relation to her work. She has not had any recent surgeries, and there have been no changes in her family medical history. She has no current cough or cold symptoms, skin concerns, concerns about hearing or vision, constipation or diarrhea, urinary concerns, or unusual muscle or joint pain.      Immunizations: tdap 2022, declines flu shot, declines covid shot Diet: needs improvement Wt Readings from Last 3 Encounters:  11/18/23 231 lb (104.8 kg)  09/04/22 236 lb (107 kg)  08/28/22 236 lb 12.8 oz (107.4 kg)  Exercise: walks, less in the winter though Colonoscopy: 2022 Pap Smear: 6/24 per gyn.  Mammogram: 02/12/23 Vision: up to date Dental: up to date     Health Maintenance Due  Topic Date Due    Hepatitis C Screening  Never done   Zoster Vaccines- Shingrix (1 of 2) Never done    Past Medical History:  Diagnosis Date   Acute upper respiratory infections of unspecified site 08/21/2014   Allergy    seasonal allergies   Anxiety    on meds   Anxiety and depression 10/27/2011   on meds   Arthritis    lower back   GERD (gastroesophageal reflux disease)    OTC PRN-diet controlled   Granuloma annulare    bx confirmed, improved   Hx: UTI (urinary tract infection)    in childhood, resolved with urethral stretching   Hyperlipidemia 04/01/2017   diet controlled   Left flank pain 08/07/2013   Migraine 06/22/2017   Obesity 09/03/2015   Palpitations    PONV (postoperative nausea and vomiting)    Preventative health care 08/26/2011   Toe pain, right 04/07/2012   URI (upper respiratory infection) 01/09/2013    Past Surgical History:  Procedure Laterality Date   BUNIONECTOMY Left    CESAREAN SECTION     CHOLECYSTECTOMY     ECTOPIC PREGNANCY SURGERY  2001   TONSILLECTOMY     TUBAL LIGATION     URETHRAL STRICTURE DILATATION     childhood    Family History  Problem Relation Age of Onset   Hypertension Mother    Depression Father        suicide   Hypertension Father  COPD Father        smoker   Colon polyps Father 47   Alzheimer's disease Maternal Grandmother    Alzheimer's disease Maternal Grandfather    Heart disease Paternal Grandfather        MI   Hypertension Brother    Colon cancer Neg Hx    Esophageal cancer Neg Hx    Stomach cancer Neg Hx    Rectal cancer Neg Hx     Social History   Socioeconomic History   Marital status: Married    Spouse name: Not on file   Number of children: Not on file   Years of education: Not on file   Highest education level: Not on file  Occupational History   Not on file  Tobacco Use   Smoking status: Never   Smokeless tobacco: Never  Vaping Use   Vaping status: Never Used  Substance and Sexual Activity   Alcohol use: No    Drug use: No   Sexual activity: Yes    Comment: lives with husband and twins. 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 no dietary restrictions  Other Topics Concern   Not on file  Social History Narrative   Not on file   Social Determinants of Health   Financial Resource Strain: Low Risk  (08/17/2022)   Received from [facility], [facility]   Overall Financial Resource Strain (CARDIA)    Difficulty of Paying Living Expenses: Not hard at all  Food Insecurity: No Food Insecurity (08/17/2022)   Received from [facility], [facility]   Hunger Vital Sign    Worried About Running Out of Food in the Last Year: Never true    Ran Out of Food in the Last Year: Never true  Transportation Needs: Not on file  Physical Activity: Insufficiently Active (08/17/2022)   Received from [facility], [facility]   Exercise Vital Sign    Days of Exercise per Week: 2 days    Minutes of Exercise per Session: 30 min  Stress: No Stress Concern Present (08/17/2022)   Received from [facility], [facility] - Occupational Stress Questionnaire    Feeling of Stress : Not at all  Social Connections: Unknown (08/22/2023)   Received from [facility]   Social Network    Social Network: Not on file  Intimate Partner Violence: Unknown (08/22/2023)   Received from [facility]   HITS    Physically Hurt: Not on file    Insult or Talk Down To: Not on file    Threaten Physical Harm: Not on file    Scream or Curse: Not on file    Outpatient Medications Prior to Visit  Medication Sig Dispense Refill   ALPRAZolam (XANAX) 0.25 MG tablet TAKE 1 TABLET BY MOUTH TWICE A DAY AS NEEDED 40 tablet 1   fluticasone (FLONASE) 50 MCG/ACT nasal spray Place 2 sprays into both nostrils daily. 16 g 6   levothyroxine (SYNTHROID) 25 MCG tablet TAKE 1 TABLET BY MOUTH EVERY DAY BEFORE BREAKFAST 90 tablet 1   buPROPion (WELLBUTRIN XL) 150 MG 24 hr tablet TAKE 1 TABLET BY MOUTH EVERY DAY 30 tablet  0   escitalopram (LEXAPRO) 20 MG tablet TAKE 1 TABLET BY MOUTH EVERY DAY 30 tablet 0   omeprazole (PRILOSEC) 20 MG capsule Take 1 capsule (20 mg total) by mouth daily as needed. 30 capsule 0   No facility-administered medications prior to visit.    Allergies  Allergen Reactions  Contrast Media [Iodinated Contrast Media]     Review of Systems  Constitutional:  Negative for weight loss.  HENT:  Negative for congestion and hearing loss.   Eyes:  Negative for blurred vision.  Respiratory:  Negative for cough.   Cardiovascular:  Negative for palpitations.  Gastrointestinal:  Negative for constipation and diarrhea.  Genitourinary:  Negative for dysuria and frequency.  Musculoskeletal:  Negative for joint pain and myalgias.  Skin:  Negative for rash.  Neurological:  Positive for headaches.  Psychiatric/Behavioral:         See HPI       Objective:    Physical Exam   BP 113/61 (BP Location: Right Arm, Patient Position: Sitting, Cuff Size: Large)   Pulse 78   Temp 97.6 F (36.4 C) (Oral)   Resp 16   Ht 5' 7.5" (1.715 m)   Wt 231 lb (104.8 kg)   SpO2 99%   BMI 35.65 kg/m  Wt Readings from Last 3 Encounters:  11/18/23 231 lb (104.8 kg)  09/04/22 236 lb (107 kg)  08/28/22 236 lb 12.8 oz (107.4 kg)   Physical Exam  Constitutional: She is oriented to person, place, and time. She appears well-developed and well-nourished. No distress.  HENT:  Head: Normocephalic and atraumatic.  Right Ear: Tympanic membrane and ear canal normal.  Left Ear: Tympanic membrane and ear canal normal.  Mouth/Throat: Oropharynx is clear and moist.  Eyes: Pupils are equal, round, and reactive to light. No scleral icterus.  Neck: Normal range of motion. No thyromegaly present.  Cardiovascular: Normal rate and regular rhythm.   No murmur heard. Pulmonary/Chest: Effort normal and breath sounds normal. No respiratory distress. He has no wheezes. She has no rales. She exhibits no tenderness.   Abdominal: Soft. Bowel sounds are normal. She exhibits no distension and no mass. There is no tenderness. There is no rebound and no guarding.  Musculoskeletal: She exhibits no edema.  Lymphadenopathy:    She has no cervical adenopathy.  Neurological: She is alert and oriented to person, place, and time. She has normal patellar reflexes. She exhibits normal muscle tone. Coordination normal.  Skin: Skin is warm and dry.  Psychiatric: She has a normal mood and affect. Her behavior is normal. Judgment and thought content normal.  Breast/pelvic: deferred      Assessment & Plan:       Assessment & Plan:   Problem List Items Addressed This Visit       Unprioritized   Preventative health care - Primary     General Health Maintenance -Immunizations up to date, patient declined flu shot and covid booster at this time. -Order labs for cholesterol, liver, kidneys, thyroid, and Hepatitis C screening. -Continue regular screenings (mammogram, Pap smear, colonoscopy). -Encourage regular exercise and healthy diet and weight loss efforts -[facility] -Follow-up in 1 year for annual physical, unless issues arise.      Relevant Orders   Comp Met (CMET)   Migraine    Stable with prn use of excedrin migraine.       Relevant Medications   escitalopram (LEXAPRO) 20 MG tablet   buPROPion (WELLBUTRIN XL) 150 MG 24 hr tablet   Menopause    We discussed that brain fog is a common symptom with menopause.  Will monitor for now. She is not interested in HRT.      Hypothyroid    Clinically stable on synthroid. Update tsh.  Lab Results  Component Value Date  TSH 2.87 08/28/2022         Relevant Orders   TSH   Hyperlipidemia    Lab Results  Component Value Date   CHOL 161 08/28/2022   HDL 47.50 08/28/2022   LDLCALC 87 08/28/2022   LDLDIRECT 125.0 06/20/2019   TRIG 132.0 08/28/2022   CHOLHDL 3 08/28/2022         Relevant Orders   Lipid panel    Anxiety and depression    Stable on lexapro and wellbutrin. Continue same.  Uses xanax very rarely. Contract updated.       Relevant Medications   escitalopram (LEXAPRO) 20 MG tablet   buPROPion (WELLBUTRIN XL) 150 MG 24 hr tablet   Acid reflux    Stable on omeprazole.      Relevant Medications   omeprazole (PRILOSEC) 20 MG capsule   Other Visit Diagnoses     Morbid obesity (HCC)       Relevant Orders   Amb Ref to Medical Weight Management   Encounter for hepatitis C screening test for low risk patient       Relevant Orders   Hepatitis C Antibody       I have changed Victorino Dike K. Masur's escitalopram and buPROPion. I am also having her maintain her fluticasone, ALPRAZolam, levothyroxine, and omeprazole.  Meds ordered this encounter  Medications   omeprazole (PRILOSEC) 20 MG capsule    Sig: Take 1 capsule (20 mg total) by mouth daily as needed.    Dispense:  90 capsule    Refill:  4    Order Specific Question:   Supervising Provider    Answer:   Danise Edge A [[id number]]   escitalopram (LEXAPRO) 20 MG tablet    Sig: Take 1 tablet (20 mg total) by mouth daily.    Dispense:  90 tablet    Refill:  1    Order Specific Question:   Supervising Provider    Answer:   Danise Edge A [[id number]]   buPROPion (WELLBUTRIN XL) 150 MG 24 hr tablet    Sig: Take 1 tablet (150 mg total) by mouth daily.    Dispense:  90 tablet    Refill:  1    Order Specific Question:   Supervising Provider    Answer:   Danise Edge A [[id number]]

## 2023-11-18 NOTE — Assessment & Plan Note (Signed)
  General Health Maintenance -Immunizations up to date, patient declined flu shot and covid booster at this time. -Order labs for cholesterol, liver, kidneys, thyroid, and Hepatitis C screening. -Continue regular screenings (mammogram, Pap smear, colonoscopy). -Encourage regular exercise and healthy diet and weight loss efforts -[facility] to [facility] -Follow-up in 1 year for annual physical, unless issues arise.

## 2023-11-18 NOTE — Patient Instructions (Signed)
VISIT SUMMARY:  Today, we reviewed your overall health during your annual physical. You reported no significant changes in your health status but expressed a need for weight loss and some concerns about forgetfulness. We discussed your current medications and made plans for managing your weight, anxiety, hypothyroidism, and GERD. We also reviewed your general health maintenance and ordered routine labs.  YOUR PLAN:  -OVERWEIGHT: Being overweight means having more body weight than is considered healthy for your height. We will refer you to the [facility] for assistance with weight management and potential weight loss injections.  -ANXIETY: Anxiety is a feeling of worry or fear that can be mild or severe. You use Xanax as needed for sleep, and we will update your prescription so you can continue using it as necessary.  -HYPOTHYROIDISM: Hypothyroidism is a condition where your thyroid gland doesn't produce enough thyroid hormone. You are feeling fine on your current dose of Synthroid, so we will continue with the same dosage.  -GASTROESOPHAGEAL REFLUX DISEASE (GERD): GERD is a condition where stomach acid frequently flows back into the tube connecting your mouth and stomach. You find relief with intermittent use of Prilosec, so you should continue taking it as needed.  -GENERAL HEALTH MAINTENANCE: Your immunizations are up to date, and you declined the flu shot at this time. We have ordered labs to check your cholesterol, liver, kidneys, thyroid, and screen for Hepatitis C. Please continue with regular screenings like mammograms, Pap smears, and colonoscopies. We encourage you to maintain regular exercise and a healthy diet.  INSTRUCTIONS:  Please follow up in 1 year for your next annual physical, unless any issues arise before then.

## 2023-11-18 NOTE — Assessment & Plan Note (Addendum)
Clinically stable on synthroid. Update tsh.  Lab Results  Component Value Date   TSH 2.87 08/28/2022

## 2023-11-19 LAB — TSH: TSH: 3.36 u[IU]/mL (ref 0.35–5.50)

## 2023-11-19 LAB — COMPREHENSIVE METABOLIC PANEL
ALT: 16 U/L (ref 0–35)
AST: 19 U/L (ref 0–37)
Albumin: 4.3 g/dL (ref 3.5–5.2)
Alkaline Phosphatase: 85 U/L (ref 39–117)
BUN: 17 mg/dL (ref 6–23)
CO2: 30 meq/L (ref 19–32)
Calcium: 9.4 mg/dL (ref 8.4–10.5)
Chloride: 101 meq/L (ref 96–112)
Creatinine, Ser: 1.1 mg/dL (ref 0.40–1.20)
GFR: 57.23 mL/min — ABNORMAL LOW (ref >60.00)
Glucose, Bld: 101 mg/dL — ABNORMAL HIGH (ref 70–99)
Potassium: 3.9 meq/L (ref 3.5–5.1)
Sodium: 141 meq/L (ref 135–145)
Total Bilirubin: 0.6 mg/dL (ref 0.2–1.2)
Total Protein: 7.2 g/dL (ref 6.0–8.3)

## 2023-11-19 LAB — LIPID PANEL
Cholesterol: 192 mg/dL (ref 0–200)
HDL: 51.1 mg/dL (ref >39.00)
LDL Cholesterol: 95 mg/dL (ref 0–99)
NonHDL: 141.27
Total CHOL/HDL Ratio: 4
Triglycerides: 233 mg/dL — ABNORMAL HIGH (ref 0.0–149.0)
VLDL: 46.6 mg/dL — ABNORMAL HIGH (ref 0.0–40.0)

## 2023-11-19 LAB — HEPATITIS C ANTIBODY: Hepatitis C Ab: NONREACTIVE

## 2023-11-21 ENCOUNTER — Other Ambulatory Visit: Payer: Self-pay | Admitting: Family Medicine

## 2023-11-23 NOTE — Telephone Encounter (Signed)
Requesting: Xanax 0.25 mg Contract: 08/28/2022 UDS: 08/28/2022 Last Visit: 11/18/2023 Next Visit: N/A Last Refill: 01/06/2023  Please Advise

## 2023-12-22 ENCOUNTER — Encounter: Payer: Self-pay | Admitting: Family Medicine

## 2023-12-23 NOTE — Telephone Encounter (Signed)
 Called patient. Made appointment for Tuesday (01/05/24) to discuss issues.

## 2024-01-03 ENCOUNTER — Other Ambulatory Visit: Payer: Self-pay | Admitting: Family Medicine

## 2024-01-04 NOTE — Assessment & Plan Note (Signed)
Struggling with ongoing stress

## 2024-01-04 NOTE — Assessment & Plan Note (Signed)
On Levothyroxine, continue to monitor

## 2024-01-04 NOTE — Assessment & Plan Note (Signed)
Encourage heart healthy diet such as MIND or DASH diet, increase exercise, avoid trans fats, simple carbohydrates and processed foods, consider a krill or fish or flaxseed oil cap daily.

## 2024-01-05 ENCOUNTER — Telehealth (INDEPENDENT_AMBULATORY_CARE_PROVIDER_SITE_OTHER): Payer: No Typology Code available for payment source | Admitting: Family Medicine

## 2024-01-05 ENCOUNTER — Encounter: Payer: Self-pay | Admitting: Family Medicine

## 2024-01-05 VITALS — Ht 67.0 in | Wt 235.0 lb

## 2024-01-05 DIAGNOSIS — E039 Hypothyroidism, unspecified: Secondary | ICD-10-CM | POA: Diagnosis not present

## 2024-01-05 DIAGNOSIS — F32A Depression, unspecified: Secondary | ICD-10-CM

## 2024-01-05 DIAGNOSIS — F419 Anxiety disorder, unspecified: Secondary | ICD-10-CM

## 2024-01-05 DIAGNOSIS — E782 Mixed hyperlipidemia: Secondary | ICD-10-CM

## 2024-01-05 DIAGNOSIS — Z0289 Encounter for other administrative examinations: Secondary | ICD-10-CM

## 2024-01-05 MED ORDER — VENLAFAXINE HCL ER 150 MG PO CP24: 150.0000 mg | ORAL_CAPSULE | Freq: Every day | ORAL | 2 refills | Status: DC

## 2024-01-05 MED ORDER — VENLAFAXINE HCL ER 75 MG PO CP24: 75.0000 mg | ORAL_CAPSULE | Freq: Every day | ORAL | 1 refills | Status: DC

## 2024-01-05 NOTE — Patient Instructions (Addendum)
Insomnia Insomnia is a sleep disorder that makes it difficult to fall asleep or stay asleep. Insomnia can cause fatigue, low energy, difficulty concentrating, mood swings, and poor performance at work or school. There are three different ways to classify insomnia: Difficulty falling asleep. Difficulty staying asleep. Waking up too early in the morning. Any type of insomnia can be long-term (chronic) or short-term (acute). Both are common. Short-term insomnia usually lasts for 3 months or less. Chronic insomnia occurs at least three times a week for longer than 3 months. What are the causes? Insomnia may be caused by another condition, situation, or substance, such as: Having certain mental health conditions, such as anxiety and depression. Using caffeine, alcohol, tobacco, or drugs. Having gastrointestinal conditions, such as gastroesophageal reflux disease (GERD). Having certain medical conditions. These include: Asthma. Alzheimer's disease. Stroke. Chronic pain. An overactive thyroid gland (hyperthyroidism). Other sleep disorders, such as restless legs syndrome and sleep apnea. Menopause. Sometimes, the cause of insomnia may not be known. What increases the risk? Risk factors for insomnia include: Gender. Females are affected more often than males. Age. Insomnia is more common as people get older. Stress and certain medical and mental health conditions. Lack of exercise. Having an irregular work schedule. This may include working night shifts and traveling between different time zones. What are the signs or symptoms? If you have insomnia, the main symptom is having trouble falling asleep or having trouble staying asleep. This may lead to other symptoms, such as: Feeling tired or having low energy. Feeling nervous about going to sleep. Not feeling rested in the morning. Having trouble concentrating. Feeling irritable, anxious, or depressed. How is this diagnosed? This condition  may be diagnosed based on: Your symptoms and medical history. Your health care provider may ask about: Your sleep habits. Any medical conditions you have. Your mental health. A physical exam. How is this treated? Treatment for insomnia depends on the cause. Treatment may focus on treating an underlying condition that is causing the insomnia. Treatment may also include: Medicines to help you sleep. Counseling or therapy. Lifestyle adjustments to help you sleep better. Follow these instructions at home: Eating and drinking  Limit or avoid alcohol, caffeinated beverages, and products that contain nicotine and tobacco, especially close to bedtime. These can disrupt your sleep. Do not eat a large meal or eat spicy foods right before bedtime. This can lead to digestive discomfort that can make it hard for you to sleep. Sleep habits  Keep a sleep diary to help you and your health care provider figure out what could be causing your insomnia. Write down: When you sleep. When you wake up during the night. How well you sleep and how rested you feel the next day. Any side effects of medicines you are taking. What you eat and drink. Make your bedroom a dark, comfortable place where it is easy to fall asleep. Put up shades or blackout curtains to block light from outside. Use a white noise machine to block noise. Keep the temperature cool. Limit screen use before bedtime. This includes: Not watching TV. Not using your smartphone, tablet, or computer. Stick to a routine that includes going to bed and waking up at the same times every day and night. This can help you fall asleep faster. Consider making a quiet activity, such as reading, part of your nighttime routine. Try to avoid taking naps during the day so that you sleep better at night. Get out of bed if you are still awake after  15 minutes of trying to sleep. Keep the lights down, but try reading or doing a quiet activity. When you feel  sleepy, go back to bed. General instructions Take over-the-counter and prescription medicines only as told by your health care provider. Exercise regularly as told by your health care provider. However, avoid exercising in the hours right before bedtime. Use relaxation techniques to manage stress. Ask your health care provider to suggest some techniques that may work well for you. These may include: Breathing exercises. Routines to release muscle tension. Visualizing peaceful scenes. Make sure that you drive carefully. Do not drive if you feel very sleepy. Keep all follow-up visits. This is important. Contact a health care provider if: You are tired throughout the day. You have trouble in your daily routine due to sleepiness. You continue to have sleep problems, or your sleep problems get worse. Get help right away if: You have thoughts about hurting yourself or someone else. Get help right away if you feel like you may hurt yourself or others, or have thoughts about taking your own life. Go to your nearest emergency room or: Call 911. Call the National Suicide Prevention Lifeline at (437)486-8660 or 988. This is open 24 hours a day. Text the Crisis Text Line at (586)746-2410. Summary Insomnia is a sleep disorder that makes it difficult to fall asleep or stay asleep. Insomnia can be long-term (chronic) or short-term (acute). Treatment for insomnia depends on the cause. Treatment may focus on treating an underlying condition that is causing the insomnia. Keep a sleep diary to help you and your health care provider figure out what could be causing your insomnia. This information is not intended to replace advice given to you by your health care provider. Make sure you discuss any questions you have with your health care provider. Document Revised: 11/04/2021 Document Reviewed: 11/04/2021 Elsevier Patient Education  2024 [employer].

## 2024-01-06 ENCOUNTER — Encounter: Payer: Self-pay | Admitting: Nurse Practitioner

## 2024-01-06 ENCOUNTER — Ambulatory Visit (INDEPENDENT_AMBULATORY_CARE_PROVIDER_SITE_OTHER): Payer: No Typology Code available for payment source | Admitting: Nurse Practitioner

## 2024-01-06 VITALS — BP 117/79 | HR 80 | Temp 98.1°F | Ht 67.0 in | Wt 236.0 lb

## 2024-01-06 DIAGNOSIS — Z6836 Body mass index (BMI) 36.0-36.9, adult: Secondary | ICD-10-CM

## 2024-01-06 DIAGNOSIS — E039 Hypothyroidism, unspecified: Secondary | ICD-10-CM

## 2024-01-06 DIAGNOSIS — E669 Obesity, unspecified: Secondary | ICD-10-CM | POA: Diagnosis not present

## 2024-01-06 DIAGNOSIS — E782 Mixed hyperlipidemia: Secondary | ICD-10-CM

## 2024-01-06 NOTE — Progress Notes (Signed)
MyChart Video Visit    Virtual Visit via Video Note   This patient is at least at moderate risk for complications without adequate follow up. This format is felt to be most appropriate for this patient at this time. Physical exam was limited by quality of the video and audio technology used for the visit. Juanetta, CMA was able to get the patient set up on a video visit.  Patient location: home Patient and provider in visit Provider location: Office  I discussed the limitations of evaluation and management by telemedicine and the availability of in person appointments. The patient expressed understanding and agreed to proceed.  Visit Date: 01/05/2024  Today's healthcare provider: Danise Edge, MD     Subjective:    Patient ID: [name], female    DOB: Feb 27, 1971, 53 y.o.   MRN: [id number]  Chief Complaint  Patient presents with  . Follow-up    Discuss anxiety issues    HPI Discussed the use of AI scribe software for clinical note transcription with the patient, who gave verbal consent to proceed.  History of Present Illness            Past Medical History:  Diagnosis Date  . Acute upper respiratory infections of unspecified site 08/21/2014  . Allergy    seasonal allergies  . Anxiety    on meds  . Anxiety and depression 10/27/2011   on meds  . Arthritis    lower back  . GERD (gastroesophageal reflux disease)    OTC PRN-diet controlled  . Granuloma annulare    bx confirmed, improved  . Hx: UTI (urinary tract infection)    in childhood, resolved with urethral stretching  . Hyperlipidemia 04/01/2017   diet controlled  . Left flank pain 08/07/2013  . Migraine 06/22/2017  . Obesity 09/03/2015  . Palpitations   . PONV (postoperative nausea and vomiting)   . Preventative health care 08/26/2011  . Toe pain, right 04/07/2012  . URI (upper respiratory infection) 01/09/2013    Past Surgical History:  Procedure Laterality Date  . BUNIONECTOMY Left    . CESAREAN SECTION    . CHOLECYSTECTOMY    . ECTOPIC PREGNANCY SURGERY  2001  . TONSILLECTOMY    . TUBAL LIGATION    . URETHRAL STRICTURE DILATATION     childhood    Family History  Problem Relation Age of Onset  . Hypertension Mother   . Depression Father        suicide  . Hypertension Father   . COPD Father        smoker  . Colon polyps Father 37  . Alzheimer's disease Maternal Grandmother   . Alzheimer's disease Maternal Grandfather   . Heart disease Paternal Grandfather        MI  . Hypertension Brother   . Colon cancer Neg Hx   . Esophageal cancer Neg Hx   . Stomach cancer Neg Hx   . Rectal cancer Neg Hx     Social History   Socioeconomic History  . Marital status: Married    Spouse name: Not on file  . Number of children: Not on file  . Years of education: Not on file  . Highest education level: Not on file  Occupational History  . Not on file  Tobacco Use  . Smoking status: Never  . Smokeless tobacco: Never  Vaping Use  . Vaping status: Never Used  Substance and Sexual Activity  . Alcohol use: No  .  Drug use: No  . Sexual activity: Yes    Comment: lives with husband and twins. 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 no dietary restrictions  Other Topics Concern  . Not on file  Social History Narrative  . Not on file   Social Drivers of Health   Financial Resource Strain: Low Risk  (08/17/2022)   Received from [facility]   Overall Financial Resource Strain (CARDIA)   . Difficulty of Paying Living Expenses: Not hard at all  Food Insecurity: No Food Insecurity (08/17/2022)   Received from [facility]   Hunger Vital Sign   . Worried About Programme researcher, broadcasting/film/video in the Last Year: Never true   . Ran Out of Food in the Last Year: Never true  Transportation Needs: Not on file  Physical Activity: Insufficiently Active (08/17/2022)   Received from [facility], [facility]   Exercise Vital Sign   . Days of Exercise per Week: 2 days    . Minutes of Exercise per Session: 30 min  Stress: No Stress Concern Present (08/17/2022)   Received from [facility] - Occupational Stress Questionnaire   . Feeling of Stress : Not at all  Social Connections: Unknown (08/22/2023)   Received from [facility]   Social Network   . Social Network: Not on file  Intimate Partner Violence: Unknown (08/22/2023)   Received from [facility]   HITS   . Physically Hurt: Not on file   . Insult or Talk Down To: Not on file   . Threaten Physical Harm: Not on file   . Scream or Curse: Not on file    Outpatient Medications Prior to Visit  Medication Sig Dispense Refill  . ALPRAZolam (XANAX) 0.25 MG tablet TAKE 1 TABLET BY MOUTH TWICE A DAY AS NEEDED 40 tablet 1  . buPROPion (WELLBUTRIN XL) 150 MG 24 hr tablet Take 1 tablet (150 mg total) by mouth daily. 90 tablet 1  . levothyroxine (SYNTHROID) 25 MCG tablet TAKE 1 TABLET BY MOUTH EVERY DAY BEFORE BREAKFAST 90 tablet 1  . omeprazole (PRILOSEC) 20 MG capsule Take 1 capsule (20 mg total) by mouth daily as needed. 90 capsule 4  . escitalopram (LEXAPRO) 20 MG tablet Take 1 tablet (20 mg total) by mouth daily. 90 tablet 1  . fluticasone (FLONASE) 50 MCG/ACT nasal spray Place 2 sprays into both nostrils daily. 16 g 6   No facility-administered medications prior to visit.    Allergies  Allergen Reactions  . Contrast Media [Iodinated Contrast Media]     Review of Systems  Constitutional:  Negative for fever and malaise/fatigue.  HENT:  Negative for congestion.   Eyes:  Negative for blurred vision.  Respiratory:  Negative for shortness of breath.   Cardiovascular:  Negative for chest pain, palpitations and leg swelling.  Gastrointestinal:  Negative for abdominal pain, blood in stool and nausea.  Genitourinary:  Negative for dysuria and frequency.  Musculoskeletal:  Negative for falls.  Skin:  Negative for rash.  Neurological:   Negative for dizziness, loss of consciousness and headaches.  Endo/Heme/Allergies:  Negative for environmental allergies.  Psychiatric/Behavioral:  Positive for depression. The patient is nervous/anxious.       Objective:    Physical Exam  Ht 5\' 7"  (1.702 m) Comment: Patient stated  Wt 235 lb (106.6 kg) Comment: Patient stated  BMI 36.81 kg/m  Wt Readings from Last 3 Encounters:  01/06/24 236 lb (107  kg)  01/05/24 235 lb (106.6 kg)  11/18/23 231 lb (104.8 kg)       Assessment & Plan:  Anxiety and depression Assessment & Plan: Struggling with ongoing stress   Hypothyroidism, unspecified type Assessment & Plan: On Levothyroxine, continue to monitor   Mixed hyperlipidemia Assessment & Plan: Encourage heart healthy diet such as MIND or DASH diet, increase exercise, avoid trans fats, simple carbohydrates and processed foods, consider a krill or fish or flaxseed oil cap daily.    Other orders -     Venlafaxine HCl ER; Take 1 capsule (75 mg total) by mouth daily with breakfast.  Dispense: 7 capsule; Refill: 1 -     Venlafaxine HCl ER; Take 1 capsule (150 mg total) by mouth daily with breakfast. Start the 150 after 1 week of the 75 mg dose  Dispense: 30 capsule; Refill: 2     Assessment and Plan              I discussed the assessment and treatment plan with the patient. The patient was provided an opportunity to ask questions and all were answered. The patient agreed with the plan and demonstrated an understanding of the instructions.   The patient was advised to call back or seek an in-person evaluation if the symptoms worsen or if the condition fails to improve as anticipated.  Danise Edge, MD [facility] at [facility] [phone] (phone) [phone] (fax)  [facility]

## 2024-01-06 NOTE — Progress Notes (Signed)
Office: [phone]  /  Fax: [phone]   Initial Visit  [name] was seen in clinic today to evaluate for obesity. She is interested in losing weight to improve overall health and reduce the risk of weight related complications. She presents today to review program treatment options, initial physical assessment, and evaluation.     She was referred by: PCP  When asked what else they would like to accomplish? She states: Adopt healthier eating patterns, Improve quality of life, and Improve appearance  Has seen [facility] in the past  When asked how has your weight affected you? She states: Contributed to orthopedic problems or mobility issues, Having fatigue, and Having poor endurance  Some associated conditions: migraines, PVC, GERD, hypothyroid, HLD, anxiety, depression, low calcium levels,   Contributing factors: Menopause  Weight promoting medications identified: None  Current nutrition plan: None  Current level of physical activity: None  Current or previous pharmacotherapy: Topiramate  Response to medication: Ineffective so it was discontinued   Past medical history includes:   Past Medical History:  Diagnosis Date   Acute upper respiratory infections of unspecified site 08/21/2014   Allergy    seasonal allergies   Anxiety    on meds   Anxiety and depression 10/27/2011   on meds   Arthritis    lower back   GERD (gastroesophageal reflux disease)    OTC PRN-diet controlled   Granuloma annulare    bx confirmed, improved   Hx: UTI (urinary tract infection)    in childhood, resolved with urethral stretching   Hyperlipidemia 04/01/2017   diet controlled   Left flank pain 08/07/2013   Migraine 06/22/2017   Obesity 09/03/2015   Palpitations    PONV (postoperative nausea and vomiting)    Preventative health care 08/26/2011   Toe pain, right 04/07/2012   URI (upper respiratory infection) 01/09/2013     Objective:   BP 117/79   Pulse 80   Temp  98.1 F (36.7 C)   Ht 5\' 7"  (1.702 m)   Wt 236 lb (107 kg)   LMP  (LMP Unknown)   SpO2 95%   BMI 36.96 kg/m  She was weighed on the bioimpedance scale: Body mass index is 36.96 kg/m.  Peak Weight:236 lbs , Body Fat%:46.8%, Visceral Fat Rating:13, Weight trend over the last 12 months: Increasing  General:  Alert, oriented and cooperative. Patient is in no acute distress.  Respiratory: Normal respiratory effort, no problems with respiration noted   Gait: able to ambulate independently  Mental Status: Normal mood and affect. Normal behavior. Normal judgment and thought content.   DIAGNOSTIC DATA REVIEWED:  BMET    Component Value Date/Time   NA 141 11/18/2023 1446   K 3.9 11/18/2023 1446   CL 101 11/18/2023 1446   CO2 30 11/18/2023 1446   GLUCOSE 101 (H) 11/18/2023 1446   BUN 17 11/18/2023 1446   CREATININE 1.10 11/18/2023 1446   CREATININE 0.86 03/05/2015 0931   CALCIUM 9.4 11/18/2023 1446   GFRNONAA 82 03/05/2015 0931   GFRAA >89 03/05/2015 0931   No results found for: "HGBA1C" No results found for: "INSULIN" CBC    Component Value Date/Time   WBC 6.1 08/28/2022 1617   RBC 4.25 08/28/2022 1617   HGB 12.3 08/28/2022 1617   HCT 36.3 08/28/2022 1617   PLT 252.0 08/28/2022 1617   MCV 85.4 08/28/2022 1617   MCHC 33.8 08/28/2022 1617   RDW 14.2 08/28/2022 1617   Iron/TIBC/Ferritin/ %Sat No results found  for: "IRON", "TIBC", "FERRITIN", "IRONPCTSAT" Lipid Panel     Component Value Date/Time   CHOL 192 11/18/2023 1446   TRIG 233.0 (H) 11/18/2023 1446   HDL 51.10 11/18/2023 1446   CHOLHDL 4 11/18/2023 1446   VLDL 46.6 (H) 11/18/2023 1446   LDLCALC 95 11/18/2023 1446   LDLDIRECT 125.0 06/20/2019 1105   Hepatic Function Panel     Component Value Date/Time   PROT 7.2 11/18/2023 1446   ALBUMIN 4.3 11/18/2023 1446   AST 19 11/18/2023 1446   ALT 16 11/18/2023 1446   ALKPHOS 85 11/18/2023 1446   BILITOT 0.6 11/18/2023 1446   BILIDIR 0.1 08/01/2013 1336   IBILI 0.5  08/01/2013 1336      Component Value Date/Time   TSH 3.36 11/18/2023 1446     Assessment and Plan:   Mixed hyperlipidemia Continue to follow up with PCP.    Hypothyroidism, unspecified type Continue follow-up with PCP.  Continue medications as directed.  Generalized obesity  BMI 36.0-36.9,adult        Obesity Treatment / Action Plan:  Patient will work on garnering support from family and friends to begin weight loss journey. Will work on eliminating or reducing the presence of highly palatable, calorie dense foods in the home. Will complete provided nutritional and psychosocial assessment questionnaire before the next appointment. Will be scheduled for indirect calorimetry to determine resting energy expenditure in a fasting state.  This will allow [place] to create a reduced calorie, high-protein meal plan to promote loss of fat mass while preserving muscle mass. Counseled on the health benefits of losing 5%-15% of total body weight. Was counseled on nutritional approaches to weight loss and benefits of reducing processed foods and consuming plant-based foods and high quality protein as part of nutritional weight management. Was counseled on pharmacotherapy and role as an adjunct in weight management.   Obesity Education Performed Today:  She was weighed on the bioimpedance scale and results were discussed and documented in the synopsis.  We discussed obesity as a disease and the importance of a more detailed evaluation of all the factors contributing to the disease.  We discussed the importance of long term lifestyle changes which include nutrition, exercise and behavioral modifications as well as the importance of customizing this to her specific health and social needs.  We discussed the benefits of reaching a healthier weight to alleviate the symptoms of existing conditions and reduce the risks of the biomechanical, metabolic and psychological effects of  obesity.  [name] appears to be in the action stage of change and states they are ready to start intensive lifestyle modifications and behavioral modifications.  30 minutes was spent today on this visit including the above counseling, pre-visit chart review, and post-visit documentation.  Reviewed by clinician on day of visit: allergies, medications, problem list, medical history, surgical history, family history, social history, and previous encounter notes pertinent to obesity diagnosis.    Theodis Sato Brandi Stille FNP-C

## 2024-01-07 ENCOUNTER — Ambulatory Visit: Payer: Self-pay | Admitting: Family Medicine

## 2024-01-07 NOTE — Telephone Encounter (Signed)
  Chief Complaint: Dysuria Symptoms: Dysuria, frequency Frequency: Ongoing since  yesterday Pertinent Negatives: Patient denies fever, hematuria Disposition: [] ED /[x] Urgent Care (no appt availability in office) / [] Appointment(In office/virtual)/ []  [facility]/ [] Home Care/ [] Refused Recommended Disposition /[]  Mobile Bus/ []  Follow-up with PCP Additional Notes: Pt reports she has been experiencing dysuria, frequency and vaginal itching since yesterday. Pt denies fever, flank pain, N/V. Pt declines OV due to work schedule and advised she will be seen at [facility]. This RN educated pt on home care, new-worsening symptoms, when to call back/seek emergent care. Pt verbalized understanding and agrees to plan.    Copied from CRM [id number]. Topic: Clinical - Medical Advice >> Jan 07, 2024  1:45 PM Denese Killings wrote: Reason for CRM: Patient thinks she has a UTI and wants to know if a prescription can be called in for it.  Symptoms -constant urination, hurts while urinating Reason for Disposition  Urinating more frequently than usual (i.e., frequency)  Answer Assessment - Initial Assessment Questions 1. SYMPTOM: "What's the main symptom you're concerned about?" (e.g., frequency, incontinence)     Dysuria, frequency 2. ONSET: "When did the  symptoms  start?"     Yesterday 3. PAIN: "Is there any pain?" If Yes, ask: "How bad is it?" (Scale: 1-10; mild, moderate, severe)     Pain with urination 4. CAUSE: "What do you think is causing the symptoms?"     UTI 5. OTHER SYMPTOMS: "Do you have any other symptoms?" (e.g., blood in urine, fever, flank pain, pain with urination)     Vaginal itching  Protocols used: Urinary Symptoms-A-AH

## 2024-01-11 ENCOUNTER — Encounter: Payer: Self-pay | Admitting: Family Medicine

## 2024-01-11 ENCOUNTER — Other Ambulatory Visit: Payer: No Typology Code available for payment source

## 2024-01-18 ENCOUNTER — Encounter: Payer: Self-pay | Admitting: Family Medicine

## 2024-01-18 ENCOUNTER — Ambulatory Visit (INDEPENDENT_AMBULATORY_CARE_PROVIDER_SITE_OTHER): Payer: No Typology Code available for payment source | Admitting: Family Medicine

## 2024-01-18 VITALS — BP 121/83 | HR 81 | Temp 97.8°F | Ht 67.0 in | Wt 233.0 lb

## 2024-01-18 DIAGNOSIS — F419 Anxiety disorder, unspecified: Secondary | ICD-10-CM

## 2024-01-18 DIAGNOSIS — R5383 Other fatigue: Secondary | ICD-10-CM

## 2024-01-18 DIAGNOSIS — E66812 Obesity, class 2: Secondary | ICD-10-CM

## 2024-01-18 DIAGNOSIS — E785 Hyperlipidemia, unspecified: Secondary | ICD-10-CM | POA: Diagnosis not present

## 2024-01-18 DIAGNOSIS — R0602 Shortness of breath: Secondary | ICD-10-CM | POA: Diagnosis not present

## 2024-01-18 DIAGNOSIS — Z6836 Body mass index (BMI) 36.0-36.9, adult: Secondary | ICD-10-CM

## 2024-01-18 DIAGNOSIS — F32A Depression, unspecified: Secondary | ICD-10-CM

## 2024-01-18 DIAGNOSIS — E039 Hypothyroidism, unspecified: Secondary | ICD-10-CM | POA: Diagnosis not present

## 2024-01-18 DIAGNOSIS — E782 Mixed hyperlipidemia: Secondary | ICD-10-CM

## 2024-01-18 NOTE — Assessment & Plan Note (Signed)
 Lab Results  Component Value Date   CHOL 192 11/18/2023   HDL 51.10 11/18/2023   LDLCALC 95 11/18/2023   LDLDIRECT 125.0 06/20/2019   TRIG 233.0 (H) 11/18/2023   CHOLHDL 4 11/18/2023   The 10-year ASCVD risk score (Arnett DK, et al., 2019) is: 1.7%   Values used to calculate the score:     Age: 53 years     Sex: Female     Is Non-Hispanic African American: No     Diabetic: No     Tobacco smoker: No     Systolic Blood Pressure: 121 mmHg     Is BP treated: No     HDL Cholesterol: 51.1 mg/dL     Total Cholesterol: 192 mg/dL

## 2024-01-18 NOTE — Progress Notes (Signed)
 At a Glance:  Vitals Temp: 97.8 F (36.6 C) BP: 121/83 Pulse Rate: 81 SpO2: 95 %   Anthropometric Measurements Height: 5\' 7"  (1.702 m) Weight: 233 lb (105.7 kg) BMI (Calculated): 36.48 Starting Weight: 233lb   Body Composition  Body Fat %: 45.9 % Fat Mass (lbs): 107 lbs Muscle Mass (lbs): 119.8 lbs Total Body Water (lbs): 80.8 lbs Visceral Fat Rating : 13   Other Clinical Data RMR: 1757 Fasting: Yes Labs: Yes Today's Visit #: 1 Starting Date: 01/18/24    EKG: Normal sinus rhythm, rate 85.  Indirect Calorimeter completed today shows a VO2 of 255 and a REE of 1757.  Her calculated basal metabolic rate is 0981 thus her basal metabolic rate is worse than expected.  Chief Complaint:  Obesity   Subjective:  [name] (MR# [id number]) is a 53 y.o. female who presents for evaluation and treatment of obesity and related comorbidities.   [name] is currently in the action stage of change and ready to dedicate time achieving and maintaining a healthier weight. [name] is interested in becoming our patient and working on intensive lifestyle modifications including (but not limited to) diet and exercise for weight loss.  [name] has been struggling with her weight. She has been unsuccessful in either losing weight, maintaining weight loss, or reaching her healthy weight goal.  She works as a Manufacturing systems engineer.  She lives w/ her husband and 72 yo twins (boy and girl).  They are supportive of eating healthy.  [name]'s habits were reviewed today and are as follows: she thinks her family will eat healthier with her, she has significant food cravings issues, she skips meals frequently, she frequently makes poor food choices, she has problems with excessive hunger, and she struggles with emotional eating.  She tends to over - snack between 3-6 pm.  She skips breakfast some days.   Other Fatigue [name] admits to daytime somnolence and admits to waking up  still tired. Patient has a history of symptoms of morning fatigue. [name] generally gets 8 hours of sleep per night, and states that she has generally restful sleep. Snoring is not present. Apneic episodes are not present. Epworth Sleepiness Score is 10.   Shortness of Breath [name] notes increasing shortness of breath with exercising and seems to be worsening over time with weight gain. She notes getting out of breath sooner with activity than she used to. This has gotten worse recently. [name] denies shortness of breath at rest or orthopnea.   Depression Screen [name]'s Food and Mood (modified PHQ-9) score was 9.     01/05/2024    9:28 AM  Depression screen PHQ 2/9  Decreased Interest 1  Down, Depressed, Hopeless 0  PHQ - 2 Score 1  Altered sleeping 1  Tired, decreased energy 1  Change in appetite 1  Feeling bad or failure about yourself  0  Trouble concentrating 1  Moving slowly or fidgety/restless 0  Suicidal thoughts 0  PHQ-9 Score 5     Assessment and Plan:   Other Fatigue [name] does feel that her weight is causing her energy to be lower than it should be. Fatigue may be related to obesity, depression or many other causes. Labs will be ordered, and in the meanwhile, [name] will focus on self care including making healthy food choices, increasing physical activity and focusing on stress reduction.  Shortness of Breath [name] does feel that she gets out of breath more easily that she used to when she exercises.  [name]'s shortness of breath appears to be obesity related and exercise induced. She has agreed to work on weight loss and gradually increase exercise to treat her exercise induced shortness of breath. Will continue to monitor closely.  [name] had a positive depression screening. Depression is commonly associated with obesity and often results in emotional eating behaviors. We will monitor this closely and work on CBT to help improve the non-hunger  eating patterns. Referral to Psychology may be required if no improvement is seen as she continues in our clinic.    Problem List Items Addressed This Visit     Anxiety and depression   She reports a stable mood on Buproprion XL 150 mg daily and Venlafaxine  XR 150 mg daily. She has a good support system at home and a stable job.  Continue current medications Will be working on improving nutrition and physical activity to improve mood Keep junk food snacks out of the house      Hyperlipidemia   Lab Results  Component Value Date   CHOL 192 11/18/2023   HDL 51.10 11/18/2023   LDLCALC 95 11/18/2023   LDLDIRECT 125.0 06/20/2019   TRIG 233.0 (H) 11/18/2023   CHOLHDL 4 11/18/2023   The 10-year ASCVD risk score (Arnett Cannon, et al., 2019) is: 1.7%   Values used to calculate the score:     Age: 66 years     Sex: Female     Is Non-Hispanic African American: No     Diabetic: No     Tobacco smoker: No     Systolic Blood Pressure: 121 mmHg     Is BP treated: No     HDL Cholesterol: 51.1 mg/dL     Total Cholesterol: 192 mg/dL       Hypothyroid   Lab Results  Component Value Date   TSH 3.36 11/18/2023   Well-controlled on levothyroxine  once daily per PCP  Continue current dose of levothyroxine       Other Visit Diagnoses       SOBOE (shortness of breath on exertion)    -  Primary     Other fatigue       Relevant Orders   EKG 12-Lead   CBC   Hemoglobin A1c   Insulin , random   VITAMIN D  25 Hydroxy (Vit-D Deficiency, Fractures)   Vitamin B12   Folate     Class 2 severe obesity due to excess calories with serious comorbidity and body mass index (BMI) of 36.0 to 36.9 in adult [place])           [name] is currently in the action stage of change and her goal is to continue with weight loss efforts. I recommend [name] begin the structured treatment plan as follows:  She has agreed to Category 3 Plan  Exercise goals: All adults should avoid inactivity. Some activity is  better than none, and adults who participate in any amount of physical activity, gain some health benefits.  Behavioral modification strategies:increasing lean protein intake, decreasing simple carbohydrates, increase H2O intake, increase high fiber foods, decreasing eating out, no skipping meals, meal planning and cooking strategies, keeping healthy foods in the home, better snacking choices, avoiding temptations, planning for success, and decrease junk food   She was informed of the importance of frequent follow-up visits to maximize her success with intensive lifestyle modifications for her multiple health conditions. She was informed we would discuss her lab results at her next visit unless there is a critical issue that needs to be  addressed sooner. Emy agreed to keep her next visit at the agreed upon time to discuss these results.  Objective:  General: Cooperative, alert, well developed, in no acute distress. HEENT: Conjunctivae and lids unremarkable. Cardiovascular: Regular rhythm.  Lungs: Normal work of breathing. Neurologic: No focal deficits.   Lab Results  Component Value Date   CREATININE 1.10 11/18/2023   BUN 17 11/18/2023   NA 141 11/18/2023   K 3.9 11/18/2023   CL 101 11/18/2023   CO2 30 11/18/2023   Lab Results  Component Value Date   ALT 16 11/18/2023   AST 19 11/18/2023   ALKPHOS 85 11/18/2023   BILITOT 0.6 11/18/2023   No results found for: "HGBA1C" No results found for: "INSULIN " Lab Results  Component Value Date   TSH 3.36 11/18/2023   Lab Results  Component Value Date   CHOL 192 11/18/2023   HDL 51.10 11/18/2023   LDLCALC 95 11/18/2023   LDLDIRECT 125.0 06/20/2019   TRIG 233.0 (H) 11/18/2023   CHOLHDL 4 11/18/2023   Lab Results  Component Value Date   WBC 6.1 08/28/2022   HGB 12.3 08/28/2022   HCT 36.3 08/28/2022   MCV 85.4 08/28/2022   PLT 252.0 08/28/2022   No results found for: "IRON", "TIBC", "FERRITIN"  Attestation  Statements:  Reviewed by clinician on day of visit: allergies, medications, problem list, medical history, surgical history, family history, social history, and previous encounter notes.  Time spent on visit including pre-visit chart review and post-visit charting and care was 45 minutes.   Luana Rumple, DO

## 2024-01-18 NOTE — Assessment & Plan Note (Signed)
 She reports a stable mood on Buproprion XL 150 mg daily and Venlafaxine  XR 150 mg daily. She has a good support system at home and a stable job.  Continue current medications Will be working on improving nutrition and physical activity to improve mood Keep junk food snacks out of the house

## 2024-01-18 NOTE — Assessment & Plan Note (Signed)
 Lab Results  Component Value Date   TSH 3.36 11/18/2023   Well-controlled on levothyroxine  once daily per PCP  Continue current dose of levothyroxine 

## 2024-01-19 LAB — HEMOGLOBIN A1C
Est. average glucose Bld gHb Est-mCnc: 105 mg/dL
Hgb A1c MFr Bld: 5.3 % (ref 4.8–5.6)

## 2024-01-19 LAB — CBC
Hematocrit: 41.4 % (ref 34.0–46.6)
Hemoglobin: 13.7 g/dL (ref 11.1–15.9)
MCH: 28.7 pg (ref 26.6–33.0)
MCHC: 33.1 g/dL (ref 31.5–35.7)
MCV: 87 fL (ref 79–97)
Platelets: 320 10*3/uL (ref 150–450)
RBC: 4.77 x10E6/uL (ref 3.77–5.28)
RDW: 12.6 % (ref 11.7–15.4)
WBC: 5.4 10*3/uL (ref 3.4–10.8)

## 2024-01-19 LAB — VITAMIN D 25 HYDROXY (VIT D DEFICIENCY, FRACTURES): Vit D, 25-Hydroxy: 35.4 ng/mL (ref 30.0–100.0)

## 2024-01-19 LAB — FOLATE: Folate: 8.6 ng/mL (ref >3.0)

## 2024-01-19 LAB — INSULIN, RANDOM: INSULIN: 18.4 u[IU]/mL (ref 2.6–24.9)

## 2024-01-19 LAB — VITAMIN B12: Vitamin B-12: 501 pg/mL (ref 232–1245)

## 2024-01-25 ENCOUNTER — Encounter: Payer: Self-pay | Admitting: Family Medicine

## 2024-01-26 ENCOUNTER — Other Ambulatory Visit: Payer: Self-pay | Admitting: Family Medicine

## 2024-01-26 MED ORDER — ESCITALOPRAM OXALATE 20 MG PO TABS: 20.0000 mg | ORAL_TABLET | Freq: Every day | ORAL | 1 refills | Status: DC

## 2024-01-27 ENCOUNTER — Other Ambulatory Visit: Payer: Self-pay | Admitting: Family Medicine

## 2024-01-28 ENCOUNTER — Ambulatory Visit (INDEPENDENT_AMBULATORY_CARE_PROVIDER_SITE_OTHER): Payer: No Typology Code available for payment source | Admitting: Family Medicine

## 2024-02-08 ENCOUNTER — Ambulatory Visit (INDEPENDENT_AMBULATORY_CARE_PROVIDER_SITE_OTHER): Admitting: Nurse Practitioner

## 2024-02-08 ENCOUNTER — Encounter: Payer: Self-pay | Admitting: Nurse Practitioner

## 2024-02-08 ENCOUNTER — Ambulatory Visit: Payer: Self-pay | Admitting: Family Medicine

## 2024-02-08 VITALS — BP 110/70 | HR 73 | Temp 98.2°F | Ht 67.0 in | Wt 238.0 lb

## 2024-02-08 DIAGNOSIS — R051 Acute cough: Secondary | ICD-10-CM | POA: Diagnosis not present

## 2024-02-08 MED ORDER — PREDNISONE 20 MG PO TABS
40.0000 mg | ORAL_TABLET | Freq: Every day | ORAL | 0 refills | Status: AC
Start: 2024-02-08 — End: 2024-02-13

## 2024-02-08 NOTE — Telephone Encounter (Signed)
 Summary: Cough   Copied From CRM [id number]. Reason for Triage: patient has been wheezing, feb 4th patient had tested positive for strep throat and feb 14th patient got sick again with strep and was on a different medication patient has been having a dry and patchy cough since feb 14th          Chief Complaint: wheezing Symptoms: dry cough Frequency: intermittent Pertinent Negatives: Patient denies fever Disposition: [] ED /[] Urgent Care (no appt availability in office) / [x] Appointment(In office/virtual)/ []  [facility]/ [] Home Care/ [] Refused Recommended Disposition /[] [facility]/ []  Follow-up with PCP Additional Notes: Dry cough and congestion for several weeks, but now having wheezing and mild shortness of breath over the weekend. No avail appt at PCP, pt scheduled at [facility] for today at 2:20pm   Reason for Disposition  [1] MILD difficulty breathing (e.g., minimal/no SOB at rest, SOB with walking, pulse <100) AND [2] NEW-onset or WORSE than normal  Answer Assessment - Initial Assessment Questions 1. RESPIRATORY STATUS: "Describe your breathing?" (e.g., wheezing, shortness of breath, unable to speak, severe coughing)      Wheezing  2. ONSET: "When did this breathing problem begin?"      Intermittent x 2 days  3. PATTERN "Does the difficult breathing come and go, or has it been constant since it started?"      Comes and goes  4. SEVERITY: "How bad is your breathing?" (e.g., mild, moderate, severe)    - MILD: No SOB at rest, mild SOB with walking, speaks normally in sentences, can lie down, no retractions, pulse < 100.    - MODERATE: SOB at rest, SOB with minimal exertion and prefers to sit, cannot lie down flat, speaks in phrases, mild retractions, audible wheezing, pulse 100-120.    - SEVERE: Very SOB at rest, speaks in single words, struggling to breathe, sitting hunched forward, retractions, pulse > 120      Mild, felt winded when walking around a large parking  lot  5. RECURRENT SYMPTOM: "Have you had difficulty breathing before?" If Yes, ask: "When was the last time?" and "What happened that time?"      No  6. CARDIAC HISTORY: "Do you have any history of heart disease?" (e.g., heart attack, angina, bypass surgery, angioplasty)      No  7. LUNG HISTORY: "Do you have any history of lung disease?"  (e.g., pulmonary embolus, asthma, emphysema)     No  8. CAUSE: "What do you think is causing the breathing problem?"      Unsure  9. OTHER SYMPTOMS: "Do you have any other symptoms? (e.g., dizziness, runny nose, cough, chest pain, fever)     Congestion, dry cough  10. O2 SATURATION MONITOR:  "Do you use an oxygen saturation monitor (pulse oximeter) at home?" If Yes, ask: "What is your reading (oxygen level) today?" "What is your usual oxygen saturation reading?" (e.g., 95%)       No  11. PREGNANCY: "Is there any chance you are pregnant?" "When was your last menstrual period?"       No  12. TRAVEL: "Have you traveled out of the country in the last month?" (e.g., travel history, exposures)       No  Protocols used: Breathing Difficulty-A-AH

## 2024-02-08 NOTE — Patient Instructions (Signed)

## 2024-02-08 NOTE — Progress Notes (Signed)
 Subjective:    Patient ID: [name], female    DOB: July 20, 1971, 53 y.o.   MRN: [id number]   Chief Complaint: Wheezing (Diagnosed with strep x2 and now wheezing/)   Wheezing  Associated symptoms include coughing. Pertinent negatives include no chest pain, shortness of breath or sore throat.    Patient come sin today c/o wheezing. She has been treated for strep 2x in the last month. Her throat is better, but now she is wheezing. Slight cough. Patient Active Problem List   Diagnosis Date Noted   Menopause 11/18/2023   Hypothyroid 08/27/2022   BV (bacterial vaginosis) 12/31/2020   Acute non-recurrent pansinusitis 12/31/2020   Low calcium levels 06/20/2019   Myalgia 06/14/2019   Patellofemoral arthritis of left knee 09/08/2017   Migraine 06/22/2017   Hyperlipidemia 04/01/2017   Acid reflux 05/23/2014   Left flank pain 08/07/2013   PVC (premature ventricular contraction) 11/13/2011   Anxiety and depression 10/27/2011   Preventative health care 08/26/2011   Granuloma annulare    Hx: UTI (urinary tract infection)    RECTAL BLEEDING 08/03/2008       Review of Systems  HENT:  Positive for congestion. Negative for sinus pressure, sinus pain and sore throat.   Respiratory:  Positive for cough and wheezing. Negative for shortness of breath.   Cardiovascular:  Negative for chest pain, palpitations and leg swelling.       Objective:   Physical Exam Constitutional:      Appearance: Normal appearance.  Cardiovascular:     Rate and Rhythm: Normal rate and regular rhythm.     Heart sounds: Normal heart sounds.  Pulmonary:     Effort: Pulmonary effort is normal.     Breath sounds: Normal breath sounds. No wheezing.  Neurological:     General: No focal deficit present.     Mental Status: She is alert and oriented to person, place, and time.  Psychiatric:        Mood and Affect: Mood normal.        Behavior: Behavior normal.     BP 110/70   Pulse 73    Temp 98.2 F (36.8 C) (Temporal)   Ht 5\' 7"  (1.702 m)   Wt 238 lb (108 kg)   LMP  (LMP Unknown)   SpO2 96%   BMI 37.28 kg/m        Assessment & Plan:  [name] in today with chief complaint of Wheezing (Diagnosed with strep x2 and now wheezing/)   1. Acute cough (Primary) 1. Take meds as prescribed 2. Use a cool mist humidifier especially during the winter months and when heat has been humid. 3. Use saline nose sprays frequently 4. Saline irrigations of the nose can be very helpful if done frequently.  * 4X daily for 1 week*  * Use of a nettie pot can be helpful with this. Follow directions with this* 5. Drink plenty of fluids 6. Keep thermostat turn down low 7.For any cough or congestion- continue mucinex 8. For fever or aces or pains- take tylenol or ibuprofen appropriate for age and weight.  * for fevers greater than 101 orally you may alternate ibuprofen and tylenol every  3 hours.    - predniSONE (DELTASONE) 20 MG tablet; Take 2 tablets (40 mg total) by mouth daily with breakfast for 5 days. 2 po daily for 5 days  Dispense: 10 tablet; Refill: 0    The above assessment and management plan was discussed  with the patient. The patient verbalized understanding of and has agreed to the management plan. Patient is aware to call the clinic if symptoms persist or worsen. Patient is aware when to return to the clinic for a follow-up visit. Patient educated on when it is appropriate to go to the emergency department.   Mary-Margaret Daphine Deutscher, FNP

## 2024-02-10 ENCOUNTER — Ambulatory Visit: Payer: No Typology Code available for payment source | Admitting: Family Medicine

## 2024-02-18 ENCOUNTER — Ambulatory Visit (INDEPENDENT_AMBULATORY_CARE_PROVIDER_SITE_OTHER): Admitting: Family Medicine

## 2024-02-18 ENCOUNTER — Encounter: Payer: Self-pay | Admitting: Family Medicine

## 2024-02-18 VITALS — BP 117/78 | HR 69 | Temp 97.7°F | Ht 67.0 in | Wt 234.0 lb

## 2024-02-18 DIAGNOSIS — E88819 Insulin resistance, unspecified: Secondary | ICD-10-CM

## 2024-02-18 DIAGNOSIS — F32A Depression, unspecified: Secondary | ICD-10-CM | POA: Diagnosis not present

## 2024-02-18 DIAGNOSIS — F419 Anxiety disorder, unspecified: Secondary | ICD-10-CM | POA: Diagnosis not present

## 2024-02-18 DIAGNOSIS — E559 Vitamin D deficiency, unspecified: Secondary | ICD-10-CM

## 2024-02-18 DIAGNOSIS — Z6836 Body mass index (BMI) 36.0-36.9, adult: Secondary | ICD-10-CM

## 2024-02-18 DIAGNOSIS — E66812 Obesity, class 2: Secondary | ICD-10-CM

## 2024-02-18 MED ORDER — QSYMIA 3.75-23 MG PO CP24
ORAL_CAPSULE | ORAL | 0 refills | Status: DC
Start: 2024-02-18 — End: 2024-04-04

## 2024-02-18 NOTE — Progress Notes (Signed)
 Office: [phone]  /  Fax: [phone]  WEIGHT SUMMARY AND BIOMETRICS  Starting Date: 01/18/24  Starting Weight: 233lb   Weight Lost Since Last Visit: 0lb   Vitals Temp: 97.7 F (36.5 C) BP: 117/78 Pulse Rate: 69 SpO2: 95 %   Body Composition  Body Fat %: 46.7 % Fat Mass (lbs): 109.4 lbs Muscle Mass (lbs): 118.6 lbs Total Body Water (lbs): 87.2 lbs Visceral Fat Rating : 13     HPI  Chief Complaint: OBESITY  Christine is here to discuss her progress with her obesity treatment plan. She is on the the Category 3 Plan and states she is following her eating plan approximately 50 % of the time. She states she is exercising 40 minutes 3 times per week.  Interval History:  Since last office visit she is up 1 lb She has been working on eating on meal plan but has had more sugar cravings in the evenings She would like more variety for lunch (doesn't like sandwiches) She has started walking this week  Pharmacotherapy: none  PHYSICAL EXAM:  Blood pressure 117/78, pulse 69, temperature 97.7 F (36.5 C), height 5\' 7"  (1.702 m), weight 234 lb (106.1 kg), SpO2 95%. Body mass index is 36.65 kg/m.  General: She is overweight, cooperative, alert, well developed, and in no acute distress. PSYCH: Has normal mood, affect and thought process.   Lungs: Normal breathing effort, no conversational dyspnea.   ASSESSMENT AND PLAN  TREATMENT PLAN FOR OBESITY:  Recommended Dietary Goals  Lucienne is currently in the action stage of change. As such, her goal is to continue weight management plan. She has agreed to the Category 3 Plan. Reviewed changes to current meal plan on after visit summary  Behavioral Intervention  We discussed the following Behavioral Modification Strategies today: increasing lean protein intake to established goals, increasing fiber rich foods, increasing water intake , work on meal planning and preparation, keeping healthy foods at home, avoiding  temptations and identifying enticing environmental cues, continue to practice mindfulness when eating, planning for success, and continue to work on maintaining a reduced calorie state, getting the recommended amount of protein, incorporating whole foods, making healthy choices, staying well hydrated and practicing mindfulness when eating..  Additional resources provided today: NA  Recommended Physical Activity Goals  Matina has been advised to work up to 150 minutes of moderate intensity aerobic activity a week and strengthening exercises 2-3 times per week for cardiovascular health, weight loss maintenance and preservation of muscle mass.   She has agreed to Start aerobic activity with a goal of 150 minutes a week at moderate intensity.   Pharmacotherapy changes for the treatment of obesity: Begin Qsymia 3.75/23 mg once daily with breakfast PDMP reviewed, informed consent signed.  Reviewed potential adverse side effects  ASSOCIATED CONDITIONS ADDRESSED TODAY  Insulin resistance Reviewed lab results with patient.  Fasting insulin elevated at 18.4 consistent with her history of easy weight gain and carb and sugar cravings.  She has never been on metformin.  She has recently started working on reducing her intake of added sugar and refined carbohydrates.  She has recently started more regular walking.  We discussed how insulin resistance can lead to prediabetes and ultimately type 2 diabetes.  Continue working on healthy lifestyle changes as outlined last visit.  Consider use of metformin  Class 2 obesity due to excess calories with body mass index (BMI) of 36.0 to 36.9 in adult, unspecified whether serious comorbidity present Patient is a good candidate for  use of Qsymia.  She has had previous exposure to topiramate without adverse side effects.  She is not at risk for pregnancy.  Blood pressure and heart rate are well-controlled with no history of cardiovascular disease or arrhythmia.  She  will use Qsymia combination with a reduced calorie diet and regular exercise. -     Qsymia; 1 capsule po q AM with breakfast  Dispense: 14 capsule; Refill: 0  Anxiety and depression Stable on bupropion XL 150 mg once daily, Lexapro 20 mg once daily.  She has good support system at home.  She has been working on improving sleep at night.  Watch for worsening anxiety with the addition of Qsymia.  Vitamin D insufficiency Reviewed lab result with patient.  Vitamin D in the low normal range at 35.4 within the optimal vitamin D over 50 to help with energy level, bone health and immune function.  Recommend taking over-the-counter vitamin D supplement at 2000 IU once daily in addition to her multivitamin daily.      She was informed of the importance of frequent follow up visits to maximize her success with intensive lifestyle modifications for her multiple health conditions.   ATTESTASTION STATEMENTS:  Reviewed by clinician on day of visit: allergies, medications, problem list, medical history, surgical history, family history, social history, and previous encounter notes pertinent to obesity diagnosis.   I have personally spent 30 minutes total time today in preparation, patient care, nutritional counseling and documentation for this visit, including the following: review of clinical lab tests; review of medical tests/procedures/services.      Glennis Brink, DO DABFM, DABOM [facility] [address] [place], [place] [address] [phone]

## 2024-02-18 NOTE — Patient Instructions (Signed)
 Add OTC vitamin D 2,000 international units  daily  Work on fitting in 30-40 min of walking 4-5 days/ wk  Hydrate well with water  Begin Qsymia 3.75/23 mg in the morning with breakfast  Lunch options: Adult lunchable: lean meat, cheese, crackers, fruit, veggie  Salad greens with lean protein source + light dressing, + fruit serving

## 2024-02-25 ENCOUNTER — Telehealth: Payer: Self-pay | Admitting: *Deleted

## 2024-02-25 NOTE — Telephone Encounter (Signed)
 Prior authorization done via cover my meds for patients Qsymia. Waiting on determination.

## 2024-03-14 ENCOUNTER — Ambulatory Visit: Admitting: Family Medicine

## 2024-03-27 NOTE — Assessment & Plan Note (Signed)
 Encourage heart healthy diet such as MIND or DASH diet, increase exercise, avoid trans fats, simple carbohydrates and processed foods, consider a krill or fish or flaxseed oil cap daily.

## 2024-03-27 NOTE — Assessment & Plan Note (Signed)
Encouraged increased hydration, 64 ounces of clear fluids daily. Minimize alcohol and caffeine. Eat small frequent meals with lean proteins and complex carbs. Avoid high and low blood sugars. Get adequate sleep, 7-8 hours a night. Needs exercise daily preferably in the morning. Try Excedrin tension vs migraine prn

## 2024-03-27 NOTE — Assessment & Plan Note (Signed)
 On Levothyroxine, continue to monitor

## 2024-03-27 NOTE — Assessment & Plan Note (Signed)
Has been noting some twitching cramp mostly in 2 fingers. Consider electrolyte abnormalities and dehydration. Check labs and hydrate. Report if worsens.

## 2024-03-27 NOTE — Assessment & Plan Note (Signed)
 Struggling with ongoing stress

## 2024-03-29 ENCOUNTER — Telehealth (INDEPENDENT_AMBULATORY_CARE_PROVIDER_SITE_OTHER): Payer: No Typology Code available for payment source | Admitting: Family Medicine

## 2024-03-29 ENCOUNTER — Encounter: Payer: Self-pay | Admitting: Family Medicine

## 2024-03-29 DIAGNOSIS — E039 Hypothyroidism, unspecified: Secondary | ICD-10-CM

## 2024-03-29 DIAGNOSIS — F32A Depression, unspecified: Secondary | ICD-10-CM

## 2024-03-29 DIAGNOSIS — G43909 Migraine, unspecified, not intractable, without status migrainosus: Secondary | ICD-10-CM

## 2024-03-29 DIAGNOSIS — F419 Anxiety disorder, unspecified: Secondary | ICD-10-CM | POA: Diagnosis not present

## 2024-03-29 DIAGNOSIS — M791 Myalgia, unspecified site: Secondary | ICD-10-CM

## 2024-03-29 DIAGNOSIS — E782 Mixed hyperlipidemia: Secondary | ICD-10-CM

## 2024-03-29 MED ORDER — CELECOXIB 200 MG PO CAPS: 200.0000 mg | ORAL_CAPSULE | Freq: Two times a day (BID) | ORAL | 1 refills | Status: DC

## 2024-03-29 NOTE — Progress Notes (Signed)
 MyChart Video/Phone Visit    Virtual Visit via Video/Phone Note   This patient is at least at moderate risk for complications without adequate follow up. This format is felt to be most appropriate for this patient at this time. Physical exam was limited by quality of the video and audio technology used for the visit. Porsha, CMA was able to get the patient set up on a video visit but patient was unable to get her camera and microphone working so we switched to telephone to complete the visit  Patient location: home Patient and provider in visit Provider location: Office  I discussed the limitations of evaluation and management by telemedicine and the availability of in person appointments. The patient expressed understanding and agreed to proceed.  Visit Date: 03/29/2024  Today's healthcare provider: Randie Bustle, MD     Subjective:    Patient ID: [name], female    DOB: 1971-11-30, 53 y.o.   MRN: [id number]  Chief Complaint  Patient presents with   Medical Management of Chronic Issues    Patient presents today for follow-up on anxiety/depression, HLD, hypothyroidism and migraines. Last visit was 11/18/2023.   Acute Visit    Patient would like to discuss joint pain as well and options for treatment.   Quality Metric Gaps    Zoster    HPI Discussed the use of AI scribe software for clinical note transcription with the patient, who gave verbal consent to proceed.  History of Present Illness [name] is a 53 year old female who presents for follow-up on weight management and joint pain.  She was previously prescribed Qysmia for weight management and took it for two weeks before stopping due to dental surgery. During the time on the medication, she noticed some improvement in appetite control. She has not resumed the medication as she is awaiting a follow-up appointment. She is attempting to manage her weight through exercise and dietary  changes.  She experiences joint pain, particularly in her knees and feet, which she believes may improve with weight loss. After her dental surgery, she was prescribed ibuprofen 600 mg, which she took two to three times a day, resulting in significant relief. She is currently out of the medication and is exploring other options for managing her pain.  She has a history of sore throat, congestion, and cough, which have been prevalent this winter. She reports improvement in these symptoms and notes that her daughter is currently experiencing a cold.  Her last cholesterol and kidney function tests were approximately five months ago, with no major issues reported. Her B12 and vitamin D  levels were checked in February and were normal. She feels her current medications are effectively managing her daily life stressors.    Past Medical History:  Diagnosis Date   Acute upper respiratory infections of unspecified site 08/21/2014   Allergy    seasonal allergies   Anxiety    on meds   Anxiety and depression 10/27/2011   on meds   Arthritis    lower back   GERD (gastroesophageal reflux disease)    OTC PRN-diet controlled   Granuloma annulare    bx confirmed, improved   Hx: UTI (urinary tract infection)    in childhood, resolved with urethral stretching   Hyperlipidemia 04/01/2017   diet controlled   Hypothyroidism    Infertility, female    Joint pain    Left flank pain 08/07/2013   Migraine 06/22/2017   Obesity 09/03/2015   Palpitations  PONV (postoperative nausea and vomiting)    Preventative health care 08/26/2011   Toe pain, right 04/07/2012   URI (upper respiratory infection) 01/09/2013    Past Surgical History:  Procedure Laterality Date   BUNIONECTOMY Left    CESAREAN SECTION     CHOLECYSTECTOMY     ECTOPIC PREGNANCY SURGERY  2001   TONSILLECTOMY     TUBAL LIGATION     URETHRAL STRICTURE DILATATION     childhood    Family History  Problem Relation Age of Onset    Hyperlipidemia Mother    Hypertension Mother    Depression Father        suicide   Hypertension Father    COPD Father        smoker   Colon polyps Father 31   Hypertension Brother    Alzheimer's disease Maternal Grandmother    Alzheimer's disease Maternal Grandfather    Heart disease Paternal Grandfather        MI   Colon cancer Neg Hx    Esophageal cancer Neg Hx    Stomach cancer Neg Hx    Rectal cancer Neg Hx     Social History   Socioeconomic History   Marital status: Married    Spouse name: Not on file   Number of children: Not on file   Years of education: Not on file   Highest education level: Not on file  Occupational History   Not on file  Tobacco Use   Smoking status: Never   Smokeless tobacco: Never  Vaping Use   Vaping status: Never Used  Substance and Sexual Activity   Alcohol use: No   Drug use: No   Sexual activity: Yes    Comment: lives with husband and twins. 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 no dietary restrictions  Other Topics Concern   Not on file  Social History Narrative   Not on file   Social Drivers of Health   Financial Resource Strain: Low Risk  (08/17/2022)   Received from [facility], [facility]   Overall Financial Resource Strain (CARDIA)    Difficulty of Paying Living Expenses: Not hard at all  Food Insecurity: Low Risk  (01/22/2024)   Received from [facility]   Hunger Vital Sign    Worried About Running Out of Food in the Last Year: Never true    Ran Out of Food in the Last Year: Never true  Transportation Needs: No Transportation Needs (01/22/2024)   Received from [facility]    In the past 12 months, has lack of reliable transportation kept you from medical appointments, meetings, work or from getting things needed for daily living? : No  Physical Activity: Insufficiently Active (08/17/2022)   Received from [facility], [facility]   Exercise Vital Sign    Days of Exercise per Week: 2 days    Minutes of  Exercise per Session: 30 min  Stress: No Stress Concern Present (08/17/2022)   Received from [facility], [facility] - Occupational Stress Questionnaire    Feeling of Stress : Not at all  Social Connections: Unknown (08/22/2023)   Received from [facility]   Social Network    Social Network: Not on file  Intimate Partner Violence: Unknown (08/22/2023)   Received from [facility]   HITS    Physically Hurt: Not on file    Insult or Talk Down To: Not on file    Threaten Physical  Harm: Not on file    Scream or Curse: Not on file    Outpatient Medications Prior to Visit  Medication Sig Dispense Refill   ALPRAZolam  (XANAX ) 0.25 MG tablet TAKE 1 TABLET BY MOUTH TWICE A DAY AS NEEDED 40 tablet 1   buPROPion  (WELLBUTRIN  XL) 150 MG 24 hr tablet Take 1 tablet (150 mg total) by mouth daily. 90 tablet 1   escitalopram  (LEXAPRO ) 20 MG tablet Take 1 tablet (20 mg total) by mouth daily. 90 tablet 1   levothyroxine  (SYNTHROID ) 25 MCG tablet TAKE 1 TABLET BY MOUTH EVERY DAY BEFORE BREAKFAST 90 tablet 1   omeprazole  (PRILOSEC) 20 MG capsule Take 1 capsule (20 mg total) by mouth daily as needed. 90 capsule 4   Phentermine-Topiramate (QSYMIA ) 3.75-23 MG CP24 1 capsule po q AM with breakfast 14 capsule 0   No facility-administered medications prior to visit.    Allergies  Allergen Reactions   Contrast Media [Iodinated Contrast Media]     Review of Systems  Constitutional:  Negative for fever and malaise/fatigue.  HENT:  Negative for congestion.   Eyes:  Negative for blurred vision.  Respiratory:  Negative for shortness of breath.   Cardiovascular:  Negative for chest pain, palpitations and leg swelling.  Gastrointestinal:  Negative for abdominal pain, blood in stool and nausea.  Genitourinary:  Negative for dysuria and frequency.  Musculoskeletal:  Positive for back pain and joint pain. Negative for falls.  Skin:  Negative for rash.   Neurological:  Negative for dizziness, loss of consciousness and headaches.  Endo/Heme/Allergies:  Negative for environmental allergies.  Psychiatric/Behavioral:  Negative for depression. The patient is not nervous/anxious.        Objective:    Physical Exam Unable to obtain via phone visit  LMP  (LMP Unknown)  Wt Readings from Last 3 Encounters:  02/18/24 234 lb (106.1 kg)  02/08/24 238 lb (108 kg)  01/18/24 233 lb (105.7 kg)       Assessment & Plan:  Mixed hyperlipidemia Assessment & Plan: Encourage heart healthy diet such as MIND or DASH diet, increase exercise, avoid trans fats, simple carbohydrates and processed foods, consider a krill or fish or flaxseed oil cap daily.    Anxiety and depression Assessment & Plan: Struggling with ongoing stress   Hypothyroidism, unspecified type Assessment & Plan: On Levothyroxine , continue to monitor   Migraine without status migrainosus, not intractable, unspecified migraine type Assessment & Plan: Encouraged increased hydration, 64 ounces of clear fluids daily. Minimize alcohol and caffeine. Eat small frequent meals with lean proteins and complex carbs. Avoid high and low blood sugars. Get adequate sleep, 7-8 hours a night. Needs exercise daily preferably in the morning. Try Excedrin tension vs migraine prn   Myalgia Assessment & Plan: Has been noting some twitching cramp mostly in 2 fingers. Consider electrolyte abnormalities and dehydration. Check labs and hydrate. Report if worsens.    Other orders -     Celecoxib ; Take 1 capsule (200 mg total) by mouth 2 (two) times daily.  Dispense: 60 capsule; Refill: 1     Assessment and Plan Assessment & Plan Joint pain Joint pain improved with ibuprofen. Discussed NSAID risks, especially with SSRIs. Chose Celebrex  for lower interaction risk. - Prescribe Celebrex  200 mg once daily, option to increase to twice daily. - Advise against mixing Celebrex  with other NSAIDs on the same  day. - Discuss potential use of acetaminophen as adjunct. - Send [facility].  Anxiety and depression Symptoms  well-managed with current medications. Emphasized importance of sleep and exercise.  Obesity Management includes lifestyle changes and pharmacotherapy. Qysmia paused post-surgery. Tolerated initial dose, concerned about phentermine side effects. Emphasized lifestyle changes for long-term success.     I discussed the assessment and treatment plan with the patient. The patient was provided an opportunity to ask questions and all were answered. The patient agreed with the plan and demonstrated an understanding of the instructions.   The patient was advised to call back or seek an in-person evaluation if the symptoms worsen or if the condition fails to improve as anticipated.  Randie Bustle, MD [facility] at [facility] [phone] (phone) [phone] (fax)  [facility]

## 2024-04-04 ENCOUNTER — Encounter: Payer: Self-pay | Admitting: Family Medicine

## 2024-04-04 ENCOUNTER — Ambulatory Visit (INDEPENDENT_AMBULATORY_CARE_PROVIDER_SITE_OTHER): Admitting: Family Medicine

## 2024-04-04 VITALS — BP 107/75 | HR 71 | Temp 98.0°F | Ht 67.0 in | Wt 233.0 lb

## 2024-04-04 DIAGNOSIS — F32A Depression, unspecified: Secondary | ICD-10-CM | POA: Diagnosis not present

## 2024-04-04 DIAGNOSIS — E66812 Obesity, class 2: Secondary | ICD-10-CM

## 2024-04-04 DIAGNOSIS — E782 Mixed hyperlipidemia: Secondary | ICD-10-CM

## 2024-04-04 DIAGNOSIS — R632 Polyphagia: Secondary | ICD-10-CM | POA: Diagnosis not present

## 2024-04-04 DIAGNOSIS — F419 Anxiety disorder, unspecified: Secondary | ICD-10-CM

## 2024-04-04 DIAGNOSIS — E6609 Other obesity due to excess calories: Secondary | ICD-10-CM

## 2024-04-04 DIAGNOSIS — Z6836 Body mass index (BMI) 36.0-36.9, adult: Secondary | ICD-10-CM

## 2024-04-04 MED ORDER — QSYMIA 7.5-46 MG PO CP24: ORAL_CAPSULE | ORAL | 0 refills | Status: DC

## 2024-04-04 MED ORDER — QSYMIA 3.75-23 MG PO CP24: ORAL_CAPSULE | ORAL | 0 refills | Status: DC

## 2024-04-04 NOTE — Telephone Encounter (Signed)
 Prior authorization denied for Qsymia .  Your plan only covers this drug if you have been in a comprehensive weight management program for at least 6 months before starting this drug. We have denied your request because you have not been taking part in a comprehensive weight management program for at least 6 months.

## 2024-04-04 NOTE — Progress Notes (Signed)
 Office: [phone]  /  Fax: [phone]  WEIGHT SUMMARY AND BIOMETRICS  Starting Date: 01/18/24  Starting Weight: 233lb   Weight Lost Since Last Visit: 1lb   Vitals Temp: 98 F (36.7 C) BP: 107/75 Pulse Rate: 71 SpO2: 98 %   Body Composition  Body Fat %: 46 % Fat Mass (lbs): 107.6 lbs Muscle Mass (lbs): 119.8 lbs Total Body Water (lbs): 85.2 lbs Visceral Fat Rating : 13     HPI  Chief Complaint: OBESITY  [name] is here to discuss her progress with her obesity treatment plan. She is on the the Category 3 Plan and states she is following her eating plan approximately 30-40 % of the time. She states she is exercising 30 minutes 3 times per week.   Interval History:  Since last office visit she is down 0 lb in the past 2 mos She didn't have insurance coverage for Qsymia  but paid out of pocket for the first 2 weeks She did have improved appetite control and had SE of dysgeusia She has been mindful of food choices and portion sizes She has walking 30 min outdoors 3 x a week She is highly motivated to continue working on behavior changes Family is supportive Denies issues with her palpitations or insomnia while on Qsymia   Pharmacotherapy: Qsymia  3.75/23 mg, ran out  PHYSICAL EXAM:  Blood pressure 107/75, pulse 71, temperature 98 F (36.7 C), height 5\' 7"  (1.702 m), weight 233 lb (105.7 kg), SpO2 98%. Body mass index is 36.49 kg/m.  General: She is overweight, cooperative, alert, well developed, and in no acute distress. PSYCH: Has normal mood, affect and thought process.   Lungs: Normal breathing effort, no conversational dyspnea.   ASSESSMENT AND PLAN  TREATMENT PLAN FOR OBESITY:  Recommended Dietary Goals  Ziya is currently in the action stage of change. As such, her goal is to continue weight management plan. She has agreed to the Category 3 Plan.  Behavioral Intervention  We discussed the following Behavioral Modification Strategies  today: increasing lean protein intake to established goals, increasing fiber rich foods, avoiding skipping meals, increasing water intake , work on meal planning and preparation, keeping healthy foods at home, practice mindfulness eating and understand the difference between hunger signals and cravings, work on managing stress, creating time for self-care and relaxation, avoiding temptations and identifying enticing environmental cues, planning for success, and continue to work on maintaining a reduced calorie state, getting the recommended amount of protein, incorporating whole foods, making healthy choices, staying well hydrated and practicing mindfulness when eating..  Additional resources provided today: NA  Recommended Physical Activity Goals  Alsha has been advised to work up to 150 minutes of moderate intensity aerobic activity a week and strengthening exercises 2-3 times per week for cardiovascular health, weight loss maintenance and preservation of muscle mass.   She has agreed to Start aerobic activity with a goal of 150 minutes a week at moderate intensity.  Increase walking time to 30 minutes 5 days a week  Pharmacotherapy changes for the treatment of obesity: Restart Qsymia  3.75/23 mg every morning for 14 days then increase to 7.5/46 mg every morning BP and HR are WNL  ASSOCIATED CONDITIONS ADDRESSED TODAY  Mixed hyperlipidemia Lab Results  Component Value Date   CHOL 192 11/18/2023   HDL 51.10 11/18/2023   LDLCALC 95 11/18/2023   LDLDIRECT 125.0 06/20/2019   TRIG 233.0 (H) 11/18/2023   CHOLHDL 4 11/18/2023  Anticipate improvement in triglyceride levels with a low saturated fat/low  sugar diet.  She is not on any cholesterol-lowering medication. Continue to work on healthy lifestyle changes and repeat fasting lipid panel in the next 5 to 6 months  Class 2 obesity due to excess calories with body mass index (BMI) of 36.0 to 36.9 in adult, unspecified whether serious  comorbidity present -     Qsymia ; 1 capsule po Q AM  Dispense: 14 capsule; Refill: 0 -     Qsymia ; 1 capsule po qAM  Dispense: 30 capsule; Refill: 0  Anxiety and depression Mood stable on Wellbutrin  XL 150 mg once daily and Lexapro  20 mg once daily per PCP.  She reports a good support system at home and reduction of emotional eating.  She has been practicing mindfulness.  Continue to work on reducing stress, high-quality sleep at night, keeping junk food triggers out of the house.  Polyphagia Polyphagia had improved with Qsymia  for 2 weeks without adverse side effects.  Will resume use of Qsymia  via mail order pharmacy     She was informed of the importance of frequent follow up visits to maximize her success with intensive lifestyle modifications for her multiple health conditions.   ATTESTASTION STATEMENTS:  Reviewed by clinician on day of visit: allergies, medications, problem list, medical history, surgical history, family history, social history, and previous encounter notes pertinent to obesity diagnosis.   I have personally spent 30 minutes total time today in preparation, patient care, nutritional counseling and education,  and documentation for this visit, including the following: review of most recent clinical lab tests, prescribing medications/ refilling medications, reviewing medical assistant documentation, review and interpretation of bioimpedence results.     [name], D.O. DABFM, DABOM [facility] [address] [place], [place] [address] [phone]

## 2024-04-30 ENCOUNTER — Other Ambulatory Visit: Payer: Self-pay | Admitting: Family Medicine

## 2024-05-04 ENCOUNTER — Ambulatory Visit: Admitting: Family Medicine

## 2024-05-30 ENCOUNTER — Other Ambulatory Visit: Payer: Self-pay | Admitting: Family Medicine

## 2024-06-12 ENCOUNTER — Telehealth

## 2024-06-12 DIAGNOSIS — J069 Acute upper respiratory infection, unspecified: Secondary | ICD-10-CM | POA: Diagnosis not present

## 2024-06-13 MED ORDER — BENZONATATE 100 MG PO CAPS: 100.0000 mg | ORAL_CAPSULE | Freq: Three times a day (TID) | ORAL | 0 refills | Status: DC | PRN

## 2024-06-13 MED ORDER — FLUTICASONE PROPIONATE 50 MCG/ACT NA SUSP: 2.0000 | Freq: Every day | NASAL | 0 refills | Status: AC

## 2024-06-13 NOTE — Progress Notes (Signed)

## 2024-06-16 ENCOUNTER — Ambulatory Visit: Payer: No Typology Code available for payment source | Admitting: Family Medicine

## 2024-06-20 ENCOUNTER — Telehealth: Admitting: Physician Assistant

## 2024-06-20 DIAGNOSIS — J019 Acute sinusitis, unspecified: Secondary | ICD-10-CM

## 2024-06-20 DIAGNOSIS — B9689 Other specified bacterial agents as the cause of diseases classified elsewhere: Secondary | ICD-10-CM

## 2024-06-20 MED ORDER — AMOXICILLIN-POT CLAVULANATE 875-125 MG PO TABS: 1.0000 | ORAL_TABLET | Freq: Two times a day (BID) | ORAL | 0 refills | Status: DC

## 2024-06-20 NOTE — Progress Notes (Signed)

## 2024-07-01 ENCOUNTER — Other Ambulatory Visit: Payer: Self-pay | Admitting: Family Medicine

## 2024-07-25 ENCOUNTER — Ambulatory Visit: Admitting: Family Medicine

## 2024-07-26 ENCOUNTER — Ambulatory Visit: Admitting: Family Medicine

## 2024-07-28 ENCOUNTER — Encounter: Payer: Self-pay | Admitting: Family Medicine

## 2024-07-31 ENCOUNTER — Other Ambulatory Visit: Payer: Self-pay | Admitting: Family Medicine

## 2024-09-07 ENCOUNTER — Other Ambulatory Visit: Payer: Self-pay | Admitting: Family Medicine

## 2024-09-11 NOTE — Assessment & Plan Note (Signed)
 On Levothyroxine, continue to monitor

## 2024-09-11 NOTE — Assessment & Plan Note (Signed)
 Hydrate and monitor

## 2024-09-11 NOTE — Assessment & Plan Note (Addendum)
Avoid offending foods, start probiotics. Do not eat large meals in late evening and consider raising head of bed.  

## 2024-09-11 NOTE — Assessment & Plan Note (Signed)
 Encourage heart healthy diet such as MIND or DASH diet, increase exercise, avoid trans fats, simple carbohydrates and processed foods, consider a krill or fish or flaxseed oil cap daily.

## 2024-09-12 ENCOUNTER — Telehealth (INDEPENDENT_AMBULATORY_CARE_PROVIDER_SITE_OTHER): Admitting: Family Medicine

## 2024-09-12 VITALS — Ht 67.0 in | Wt 226.0 lb

## 2024-09-12 DIAGNOSIS — M791 Myalgia, unspecified site: Secondary | ICD-10-CM

## 2024-09-12 DIAGNOSIS — E782 Mixed hyperlipidemia: Secondary | ICD-10-CM | POA: Diagnosis not present

## 2024-09-12 DIAGNOSIS — B9689 Other specified bacterial agents as the cause of diseases classified elsewhere: Secondary | ICD-10-CM

## 2024-09-12 DIAGNOSIS — K219 Gastro-esophageal reflux disease without esophagitis: Secondary | ICD-10-CM | POA: Diagnosis not present

## 2024-09-12 DIAGNOSIS — E039 Hypothyroidism, unspecified: Secondary | ICD-10-CM | POA: Diagnosis not present

## 2024-09-12 DIAGNOSIS — R739 Hyperglycemia, unspecified: Secondary | ICD-10-CM

## 2024-09-12 DIAGNOSIS — J019 Acute sinusitis, unspecified: Secondary | ICD-10-CM

## 2024-09-13 ENCOUNTER — Encounter: Payer: Self-pay | Admitting: Family Medicine

## 2024-09-13 NOTE — Progress Notes (Signed)
 MyChart Video Visit    Virtual Visit via Video Note   This patient is at least at moderate risk for complications without adequate follow up. This format is felt to be most appropriate for this patient at this time. Physical exam was limited by quality of the video and audio technology used for the visit. Levorn, CMA was able to get the patient set up on a video visit.  Patient location: home Patient and provider in visit Provider location: Office  I discussed the limitations of evaluation and management by telemedicine and the availability of in person appointments. The patient expressed understanding and agreed to proceed.  Visit Date: 09/12/2024  Today's healthcare provider: Harlene Horton, MD     Subjective:    Patient ID: [name], female    DOB: 07-26-1971, 53 y.o.   MRN: [id number]  Chief Complaint  Patient presents with   Anxiety    Follow up, doing well on medication    Obesity    Being treated with wegovy at wellness clinic    HPI Discussed the use of AI scribe software for clinical note transcription with the patient, who gave verbal consent to proceed.  History of Present Illness [name] is a 53 year old female who presents for follow-up on weight management with Wegovy.  She has been on [facility] for approximately six weeks, currently at a dose of 0.5 mg. She has experienced a weight loss of 15 pounds, reducing her weight from 241 pounds to 226 pounds. She notes significant improvement in joint pain, particularly in her feet and knees, and feels generally well. Her insurance covers the medication, and she pays $29 monthly after using a coupon.  She receives her [facility] prescription from [facility] in [place], where she sees a Publishing rights manager. Previously, she attended a different clinic where visits were significantly more expensive, costing $300 per visit, compared to $99 at her current clinic.  She  reports improvement in heartburn symptoms and is now taking omeprazole  every other day. No constipation or diarrhea.  Her blood pressure was checked recently and was reported as normal, though she does not recall the exact numbers. She is 5 feet 7 inches tall. She has not had recent blood work done and has not received a flu or COVID vaccine yet this season.    Past Medical History:  Diagnosis Date   Acute upper respiratory infections of unspecified site 08/21/2014   Allergy    seasonal allergies   Anxiety    on meds   Anxiety and depression 10/27/2011   on meds   Arthritis    lower back   GERD (gastroesophageal reflux disease)    OTC PRN-diet controlled   Granuloma annulare    bx confirmed, improved   Hx: UTI (urinary tract infection)    in childhood, resolved with urethral stretching   Hyperlipidemia 04/01/2017   diet controlled   Hypothyroidism    Infertility, female    Joint pain    Left flank pain 08/07/2013   Migraine 06/22/2017   Obesity 09/03/2015   Palpitations    PONV (postoperative nausea and vomiting)    Preventative health care 08/26/2011   Toe pain, right 04/07/2012   URI (upper respiratory infection) 01/09/2013    Past Surgical History:  Procedure Laterality Date   BUNIONECTOMY Left    CESAREAN SECTION     CHOLECYSTECTOMY     ECTOPIC PREGNANCY SURGERY  2001   TONSILLECTOMY  TUBAL LIGATION     URETHRAL STRICTURE DILATATION     childhood    Family History  Problem Relation Age of Onset   Hyperlipidemia Mother    Hypertension Mother    Depression Father        suicide   Hypertension Father    COPD Father        smoker   Colon polyps Father 4   Hypertension Brother    Alzheimer's disease Maternal Grandmother    Alzheimer's disease Maternal Grandfather    Heart disease Paternal Grandfather        MI   Colon cancer Neg Hx    Esophageal cancer Neg Hx    Stomach cancer Neg Hx    Rectal cancer Neg Hx     Social History   Socioeconomic  History   Marital status: Married    Spouse name: Not on file   Number of children: Not on file   Years of education: Not on file   Highest education level: Not on file  Occupational History   Not on file  Tobacco Use   Smoking status: Never   Smokeless tobacco: Never  Vaping Use   Vaping status: Never Used  Substance and Sexual Activity   Alcohol use: No   Drug use: No   Sexual activity: Yes    Comment: lives with husband and twins. teaches K at [employer] no dietary restrictions  Other Topics Concern   Not on file  Social History Narrative   Not on file   Social Drivers of Health   Financial Resource Strain: Low Risk  (08/17/2022)   Received from [facility]   Overall Financial Resource Strain (CARDIA)    Difficulty of Paying Living Expenses: Not hard at all  Food Insecurity: Low Risk  (01/22/2024)   Received from [facility]   Hunger Vital Sign    Within the past 12 months, you worried that your food would run out before you got money to buy more: Never true    Within the past 12 months, the food you bought just didn't last and you didn't have money to get more. : Never true  Transportation Needs: No Transportation Needs (01/22/2024)   Received from [facility]    In the past 12 months, has lack of reliable transportation kept you from medical appointments, meetings, work or from getting things needed for daily living? : No  Physical Activity: Insufficiently Active (08/17/2022)   Received from [facility]   Exercise Vital Sign    On average, how many days per week do you engage in moderate to strenuous exercise (like a brisk walk)?: 2 days    On average, how many minutes do you engage in exercise at this level?: 30 min  Stress: No Stress Concern Present (08/17/2022)   Received from [facility] - Occupational Stress Questionnaire    Feeling of Stress : Not at all  Social Connections: Unknown (08/22/2023)    Received from [facility]   Social Network    Social Network: Not on file  Intimate Partner Violence: Unknown (08/22/2023)   Received from [facility]   HITS    Physically Hurt: Not on file    Insult or Talk Down To: Not on file    Threaten Physical Harm: Not on file    Scream or Curse: Not on file    Outpatient Medications Prior to Visit  Medication Sig Dispense Refill  ALPRAZolam  (XANAX ) 0.25 MG tablet TAKE 1 TABLET BY MOUTH TWICE A DAY AS NEEDED 40 tablet 1   celecoxib  (CELEBREX ) 200 MG capsule TAKE 1 CAPSULE BY MOUTH TWICE A DAY 180 capsule 1   escitalopram  (LEXAPRO ) 20 MG tablet Take 1 tablet (20 mg total) by mouth daily. 90 tablet 1   fluticasone  (FLONASE ) 50 MCG/ACT nasal spray Place 2 sprays into both nostrils daily. 16 g 0   levothyroxine  (SYNTHROID ) 25 MCG tablet TAKE 1 TABLET BY MOUTH EVERY DAY BEFORE BREAKFAST 90 tablet 1   omeprazole  (PRILOSEC) 20 MG capsule Take 1 capsule (20 mg total) by mouth daily as needed. 90 capsule 4   WEGOVY 1 MG/0.5ML SOAJ SQ injection      amoxicillin -clavulanate (AUGMENTIN ) 875-125 MG tablet Take 1 tablet by mouth 2 (two) times daily. 14 tablet 0   benzonatate  (TESSALON ) 100 MG capsule Take 1-2 capsules (100-200 mg total) by mouth 3 (three) times daily as needed. 30 capsule 0   No facility-administered medications prior to visit.    Allergies  Allergen Reactions   Contrast Media [Iodinated Contrast Media]     Review of Systems  Constitutional:  Negative for fever and malaise/fatigue.  HENT:  Negative for congestion.   Eyes:  Negative for blurred vision.  Respiratory:  Negative for shortness of breath.   Cardiovascular:  Negative for chest pain, palpitations and leg swelling.  Gastrointestinal:  Negative for abdominal pain, blood in stool and nausea.  Genitourinary:  Negative for dysuria and frequency.  Musculoskeletal:  Negative for falls.  Skin:  Negative for rash.  Neurological:  Negative for dizziness, loss of consciousness  and headaches.  Endo/Heme/Allergies:  Negative for environmental allergies.  Psychiatric/Behavioral:  Negative for depression. The patient is not nervous/anxious.        Objective:    Physical Exam Constitutional:      General: She is not in acute distress.    Appearance: Normal appearance. She is not ill-appearing or toxic-appearing.  HENT:     Head: Normocephalic and atraumatic.     Right Ear: External ear normal.     Left Ear: External ear normal.     Nose: Nose normal.  Eyes:     General:        Right eye: No discharge.        Left eye: No discharge.  Pulmonary:     Effort: Pulmonary effort is normal.  Skin:    Findings: No rash.  Neurological:     Mental Status: She is alert and oriented to person, place, and time.  Psychiatric:        Behavior: Behavior normal.     Ht 5' 7 (1.702 m)   Wt 226 lb (102.5 kg)   LMP  (LMP Unknown)   BMI 35.40 kg/m  Wt Readings from Last 3 Encounters:  09/12/24 226 lb (102.5 kg)  04/04/24 233 lb (105.7 kg)  02/18/24 234 lb (106.1 kg)       Assessment & Plan:  Gastroesophageal reflux disease without esophagitis Assessment & Plan: Avoid offending foods, start probiotics. Do not eat large meals in late evening and consider raising head of bed.  Orders: -     CBC with Differential/Platelet; Future  Mixed hyperlipidemia Assessment & Plan: Encourage heart healthy diet such as MIND or DASH diet, increase exercise, avoid trans fats, simple carbohydrates and processed foods, consider a krill or fish or flaxseed oil cap daily.   Orders: -     Lipid panel; Future  Hypothyroidism,  unspecified type Assessment & Plan: On Levothyroxine , continue to monitor  Orders: -     TSH; Future  Myalgia Assessment & Plan: Hydrate and monitor   Orders: -     Comprehensive metabolic panel with GFR; Future -     CBC with Differential/Platelet; Future  Acute bacterial sinusitis  Hyperglycemia -     Hemoglobin A1c; Future      Assessment and Plan Assessment & Plan Obesity Currently on Wegovy 0.5 mg for 4-6 weeks with a weight loss of 15 pounds, now weighing 226 pounds. Reports significant improvement in joint pain and overall well-being. Insurance covers the medication with a $29 monthly copay. Benefits of Wegovy include decreased inflammation and improved metabolism, contributing to weight loss and symptom improvement. She continues to maintain a heart healthy diet and manage her portion sizes and she is trying to exercise more often - Continue Wegovy as prescribed. - Order lab work to monitor blood count, kidney function, and check for anemia. - Investigate insurance coverage issues related to weight management services.  Gastroesophageal reflux disease (GERD) Improvement in GERD symptoms with weight loss. Currently taking omeprazole  every other day with symptom improvement. - Continue omeprazole  as needed. - Monitor GERD symptoms with ongoing weight loss.  General Health Maintenance Has not received flu or COVID vaccinations for the season. Informed about walk-in vaccine clinics at local pharmacies. - Encourage receiving flu and COVID vaccinations at walk-in clinics or local pharmacies. - Ensure vaccination records are updated in the medical chart.  Recording duration: 13 minutes     I discussed the assessment and treatment plan with the patient. The patient was provided an opportunity to ask questions and all were answered. The patient agreed with the plan and demonstrated an understanding of the instructions.   The patient was advised to call back or seek an in-person evaluation if the symptoms worsen or if the condition fails to improve as anticipated.  Harlene Horton, MD [facility] at [facility] [phone] (phone) [phone] (fax)  [facility]

## 2024-10-03 ENCOUNTER — Ambulatory Visit: Admitting: Family Medicine

## 2024-12-09 ENCOUNTER — Other Ambulatory Visit: Payer: Self-pay | Admitting: Family Medicine

## 2024-12-09 NOTE — Telephone Encounter (Signed)
 Requesting: Xanax  0.25 MG Contract: 11/18/2023 UDS: 08/28/2022 Last Visit: 09/12/2024 Next Visit: Visit date not found Last Refill: 11/23/2023  Please Advise

## 2024-12-11 ENCOUNTER — Other Ambulatory Visit: Payer: Self-pay | Admitting: Family
